# Patient Record
Sex: Female | Born: 1965 | Race: White | Hispanic: No | Marital: Single | Smoking: Former smoker
Health system: Southern US, Community
[De-identification: ages and names within clinical notes are randomized; demographics above are authoritative.]

## PROBLEM LIST (undated history)

## (undated) DIAGNOSIS — E559 Vitamin D deficiency, unspecified: Secondary | ICD-10-CM

## (undated) DIAGNOSIS — I10 Essential (primary) hypertension: Secondary | ICD-10-CM

## (undated) DIAGNOSIS — G43909 Migraine, unspecified, not intractable, without status migrainosus: Secondary | ICD-10-CM

## (undated) DIAGNOSIS — E0590 Thyrotoxicosis, unspecified without thyrotoxic crisis or storm: Secondary | ICD-10-CM

## (undated) DIAGNOSIS — M1990 Unspecified osteoarthritis, unspecified site: Secondary | ICD-10-CM

## (undated) DIAGNOSIS — K219 Gastro-esophageal reflux disease without esophagitis: Secondary | ICD-10-CM

## (undated) DIAGNOSIS — M7120 Synovial cyst of popliteal space [Baker], unspecified knee: Secondary | ICD-10-CM

## (undated) DIAGNOSIS — M109 Gout, unspecified: Secondary | ICD-10-CM

## (undated) DIAGNOSIS — I25111 Atherosclerotic heart disease of native coronary artery with angina pectoris with documented spasm: Secondary | ICD-10-CM

## (undated) DIAGNOSIS — Z9889 Other specified postprocedural states: Secondary | ICD-10-CM

## (undated) DIAGNOSIS — K635 Polyp of colon: Secondary | ICD-10-CM

## (undated) DIAGNOSIS — K2270 Barrett's esophagus without dysplasia: Secondary | ICD-10-CM

## (undated) DIAGNOSIS — R9389 Abnormal findings on diagnostic imaging of other specified body structures: Secondary | ICD-10-CM

## (undated) HISTORY — PX: TONSILLECTOMY: SHX5217

## (undated) HISTORY — DX: Barrett's esophagus without dysplasia: K22.70

## (undated) HISTORY — DX: Gastro-esophageal reflux disease without esophagitis: K21.9

## (undated) HISTORY — PX: APPENDECTOMY: SHX54

## (undated) HISTORY — DX: Unspecified osteoarthritis, unspecified site: M19.90

## (undated) HISTORY — DX: Vitamin D deficiency, unspecified: E55.9

## (undated) HISTORY — DX: Polyp of colon: K63.5

## (undated) HISTORY — DX: Other specified postprocedural states: Z98.890

## (undated) HISTORY — PX: OTHER SURGICAL HISTORY: SHX169

## (undated) HISTORY — DX: Migraine, unspecified, not intractable, without status migrainosus: G43.909

## (undated) HISTORY — PX: ABDOMINAL HYSTERECTOMY: SHX81

## (undated) HISTORY — DX: Synovial cyst of popliteal space (Baker), unspecified knee: M71.20

## (undated) HISTORY — DX: Abnormal findings on diagnostic imaging of other specified body structures: R93.89

---

## 1998-10-02 HISTORY — PX: HIATAL HERNIA REPAIR: SHX195

## 1999-11-30 ENCOUNTER — Encounter: Payer: Self-pay | Admitting: Emergency Medicine

## 1999-11-30 ENCOUNTER — Emergency Department (HOSPITAL_COMMUNITY): Admission: EM | Admit: 1999-11-30 | Discharge: 1999-12-01 | Payer: Self-pay | Admitting: Emergency Medicine

## 2000-12-05 ENCOUNTER — Emergency Department (HOSPITAL_COMMUNITY): Admission: EM | Admit: 2000-12-05 | Discharge: 2000-12-05 | Payer: Self-pay | Admitting: *Deleted

## 2001-11-03 ENCOUNTER — Emergency Department (HOSPITAL_COMMUNITY): Admission: EM | Admit: 2001-11-03 | Discharge: 2001-11-03 | Payer: Self-pay | Admitting: Emergency Medicine

## 2005-11-07 ENCOUNTER — Emergency Department (HOSPITAL_COMMUNITY): Admission: EM | Admit: 2005-11-07 | Discharge: 2005-11-07 | Payer: Self-pay | Admitting: Emergency Medicine

## 2006-01-17 ENCOUNTER — Inpatient Hospital Stay (HOSPITAL_COMMUNITY): Admission: AD | Admit: 2006-01-17 | Discharge: 2006-01-19 | Payer: Self-pay | Admitting: Obstetrics and Gynecology

## 2006-02-12 ENCOUNTER — Inpatient Hospital Stay (HOSPITAL_COMMUNITY): Admission: AD | Admit: 2006-02-12 | Discharge: 2006-02-14 | Payer: Self-pay | Admitting: Obstetrics and Gynecology

## 2006-02-14 ENCOUNTER — Ambulatory Visit: Payer: Self-pay | Admitting: Neonatology

## 2006-02-21 ENCOUNTER — Encounter (HOSPITAL_COMMUNITY): Admission: RE | Admit: 2006-02-21 | Discharge: 2006-03-23 | Payer: Self-pay | Admitting: Obstetrics & Gynecology

## 2006-03-17 ENCOUNTER — Emergency Department (HOSPITAL_COMMUNITY): Admission: EM | Admit: 2006-03-17 | Discharge: 2006-03-17 | Payer: Self-pay | Admitting: *Deleted

## 2006-04-05 ENCOUNTER — Encounter (HOSPITAL_COMMUNITY): Admission: RE | Admit: 2006-04-05 | Discharge: 2006-05-05 | Payer: Self-pay | Admitting: Obstetrics & Gynecology

## 2006-04-25 ENCOUNTER — Inpatient Hospital Stay (HOSPITAL_COMMUNITY): Admission: AD | Admit: 2006-04-25 | Discharge: 2006-04-25 | Payer: Self-pay | Admitting: Obstetrics and Gynecology

## 2006-05-17 ENCOUNTER — Inpatient Hospital Stay (HOSPITAL_COMMUNITY): Admission: RE | Admit: 2006-05-17 | Discharge: 2006-05-19 | Payer: Self-pay | Admitting: Obstetrics and Gynecology

## 2007-05-04 ENCOUNTER — Emergency Department (HOSPITAL_COMMUNITY): Admission: EM | Admit: 2007-05-04 | Discharge: 2007-05-04 | Payer: Self-pay | Admitting: Emergency Medicine

## 2007-05-16 ENCOUNTER — Inpatient Hospital Stay (HOSPITAL_COMMUNITY): Admission: AD | Admit: 2007-05-16 | Discharge: 2007-05-16 | Payer: Self-pay | Admitting: Obstetrics and Gynecology

## 2007-05-22 ENCOUNTER — Inpatient Hospital Stay (HOSPITAL_COMMUNITY): Admission: AD | Admit: 2007-05-22 | Discharge: 2007-05-23 | Payer: Self-pay | Admitting: Family Medicine

## 2007-05-23 ENCOUNTER — Other Ambulatory Visit: Admission: RE | Admit: 2007-05-23 | Discharge: 2007-05-23 | Payer: Self-pay | Admitting: Obstetrics and Gynecology

## 2007-05-23 ENCOUNTER — Encounter: Payer: Self-pay | Admitting: Obstetrics and Gynecology

## 2007-05-23 ENCOUNTER — Ambulatory Visit: Payer: Self-pay | Admitting: Obstetrics & Gynecology

## 2007-05-30 ENCOUNTER — Ambulatory Visit: Payer: Self-pay | Admitting: Obstetrics & Gynecology

## 2007-06-06 ENCOUNTER — Inpatient Hospital Stay (HOSPITAL_COMMUNITY): Admission: AD | Admit: 2007-06-06 | Discharge: 2007-06-07 | Payer: Self-pay | Admitting: Obstetrics and Gynecology

## 2007-06-12 ENCOUNTER — Ambulatory Visit: Payer: Self-pay | Admitting: Obstetrics and Gynecology

## 2007-06-19 ENCOUNTER — Ambulatory Visit: Payer: Self-pay | Admitting: Obstetrics and Gynecology

## 2007-06-25 ENCOUNTER — Emergency Department (HOSPITAL_COMMUNITY): Admission: EM | Admit: 2007-06-25 | Discharge: 2007-06-25 | Payer: Self-pay | Admitting: *Deleted

## 2007-07-25 ENCOUNTER — Ambulatory Visit: Payer: Self-pay | Admitting: Obstetrics and Gynecology

## 2007-08-02 ENCOUNTER — Ambulatory Visit (HOSPITAL_COMMUNITY): Admission: RE | Admit: 2007-08-02 | Discharge: 2007-08-02 | Payer: Self-pay | Admitting: Obstetrics & Gynecology

## 2007-08-07 ENCOUNTER — Encounter: Payer: Self-pay | Admitting: Obstetrics and Gynecology

## 2007-08-07 ENCOUNTER — Inpatient Hospital Stay (HOSPITAL_COMMUNITY): Admission: RE | Admit: 2007-08-07 | Discharge: 2007-08-10 | Payer: Self-pay | Admitting: Obstetrics and Gynecology

## 2007-08-12 ENCOUNTER — Ambulatory Visit: Payer: Self-pay | Admitting: Obstetrics and Gynecology

## 2007-08-14 ENCOUNTER — Ambulatory Visit: Payer: Self-pay | Admitting: Obstetrics & Gynecology

## 2007-08-22 ENCOUNTER — Ambulatory Visit: Payer: Self-pay | Admitting: Obstetrics & Gynecology

## 2007-09-05 ENCOUNTER — Ambulatory Visit: Payer: Self-pay | Admitting: Obstetrics & Gynecology

## 2008-07-08 ENCOUNTER — Ambulatory Visit: Payer: Self-pay | Admitting: Obstetrics and Gynecology

## 2009-01-03 ENCOUNTER — Emergency Department (HOSPITAL_COMMUNITY): Admission: EM | Admit: 2009-01-03 | Discharge: 2009-01-03 | Payer: Self-pay | Admitting: Emergency Medicine

## 2009-03-05 ENCOUNTER — Emergency Department (HOSPITAL_COMMUNITY): Admission: EM | Admit: 2009-03-05 | Discharge: 2009-03-06 | Payer: Self-pay | Admitting: Emergency Medicine

## 2009-06-01 ENCOUNTER — Emergency Department (HOSPITAL_COMMUNITY): Admission: EM | Admit: 2009-06-01 | Discharge: 2009-06-01 | Payer: Self-pay | Admitting: Family Medicine

## 2009-06-17 ENCOUNTER — Emergency Department (HOSPITAL_COMMUNITY): Admission: EM | Admit: 2009-06-17 | Discharge: 2009-06-17 | Payer: Self-pay | Admitting: Emergency Medicine

## 2009-08-05 DIAGNOSIS — S6990XA Unspecified injury of unspecified wrist, hand and finger(s), initial encounter: Secondary | ICD-10-CM

## 2009-08-05 DIAGNOSIS — S6980XA Other specified injuries of unspecified wrist, hand and finger(s), initial encounter: Secondary | ICD-10-CM

## 2009-08-05 DIAGNOSIS — Q602 Renal agenesis, unspecified: Secondary | ICD-10-CM | POA: Insufficient documentation

## 2009-08-16 ENCOUNTER — Encounter: Admission: RE | Admit: 2009-08-16 | Discharge: 2009-08-16 | Payer: Self-pay | Admitting: Family Medicine

## 2009-08-30 ENCOUNTER — Encounter: Admission: RE | Admit: 2009-08-30 | Discharge: 2009-08-30 | Payer: Self-pay | Admitting: Family Medicine

## 2009-08-31 ENCOUNTER — Emergency Department (HOSPITAL_COMMUNITY): Admission: EM | Admit: 2009-08-31 | Discharge: 2009-09-01 | Payer: Self-pay | Admitting: Emergency Medicine

## 2009-09-03 DIAGNOSIS — R935 Abnormal findings on diagnostic imaging of other abdominal regions, including retroperitoneum: Secondary | ICD-10-CM | POA: Insufficient documentation

## 2009-09-13 ENCOUNTER — Encounter: Admission: RE | Admit: 2009-09-13 | Discharge: 2009-09-13 | Payer: Self-pay | Admitting: Family Medicine

## 2009-09-23 DIAGNOSIS — R9389 Abnormal findings on diagnostic imaging of other specified body structures: Secondary | ICD-10-CM

## 2009-09-23 DIAGNOSIS — F411 Generalized anxiety disorder: Secondary | ICD-10-CM

## 2009-11-14 ENCOUNTER — Emergency Department (HOSPITAL_COMMUNITY): Admission: EM | Admit: 2009-11-14 | Discharge: 2009-11-14 | Payer: Self-pay | Admitting: Emergency Medicine

## 2010-01-13 DIAGNOSIS — M109 Gout, unspecified: Secondary | ICD-10-CM

## 2010-01-13 HISTORY — DX: Gout, unspecified: M10.9

## 2010-03-17 ENCOUNTER — Emergency Department (HOSPITAL_COMMUNITY): Admission: EM | Admit: 2010-03-17 | Discharge: 2010-03-17 | Payer: Self-pay | Admitting: Emergency Medicine

## 2010-12-23 LAB — POCT I-STAT, CHEM 8
BUN: 16 mg/dL (ref 6–23)
Calcium, Ion: 1.08 mmol/L — ABNORMAL LOW (ref 1.12–1.32)
Chloride: 106 mEq/L (ref 96–112)
Creatinine, Ser: 1 mg/dL (ref 0.4–1.2)
Glucose, Bld: 117 mg/dL — ABNORMAL HIGH (ref 70–99)
HCT: 42 % (ref 36.0–46.0)
Hemoglobin: 14.3 g/dL (ref 12.0–15.0)
Potassium: 4.1 mEq/L (ref 3.5–5.1)
Sodium: 137 meq/L (ref 135–145)
TCO2: 25 mmol/L (ref 0–100)

## 2010-12-23 LAB — URINALYSIS, ROUTINE W REFLEX MICROSCOPIC
Bilirubin Urine: NEGATIVE
Glucose, UA: NEGATIVE mg/dL
Ketones, ur: NEGATIVE mg/dL
Leukocytes, UA: NEGATIVE
Nitrite: NEGATIVE
Protein, ur: NEGATIVE mg/dL
Specific Gravity, Urine: 1.015 (ref 1.005–1.030)
Urobilinogen, UA: 0.2 mg/dL (ref 0.0–1.0)
pH: 6.5 (ref 5.0–8.0)

## 2010-12-23 LAB — CBC
HCT: 40.8 % (ref 36.0–46.0)
Hemoglobin: 13.9 g/dL (ref 12.0–15.0)
MCHC: 34.2 g/dL (ref 30.0–36.0)
MCV: 94.4 fL (ref 78.0–100.0)
Platelets: 213 K/uL (ref 150–400)
RBC: 4.32 MIL/uL (ref 3.87–5.11)
RDW: 13.2 % (ref 11.5–15.5)
WBC: 8.8 K/uL (ref 4.0–10.5)

## 2010-12-23 LAB — URIC ACID: Uric Acid, Serum: 6.7 mg/dL (ref 2.4–7.0)

## 2010-12-23 LAB — DIFFERENTIAL
Basophils Absolute: 0 10*3/uL (ref 0.0–0.1)
Basophils Relative: 0 % (ref 0–1)
Eosinophils Absolute: 0.2 K/uL (ref 0.0–0.7)
Eosinophils Relative: 3 % (ref 0–5)
Lymphocytes Relative: 19 % (ref 12–46)
Lymphs Abs: 1.7 K/uL (ref 0.7–4.0)
Monocytes Absolute: 0.6 10*3/uL (ref 0.1–1.0)
Monocytes Relative: 7 % (ref 3–12)
Neutro Abs: 6.3 K/uL (ref 1.7–7.7)
Neutrophils Relative %: 71 % (ref 43–77)

## 2010-12-23 LAB — URINE MICROSCOPIC-ADD ON

## 2011-01-03 LAB — POCT I-STAT, CHEM 8
BUN: 14 mg/dL (ref 6–23)
Calcium, Ion: 1.15 mmol/L (ref 1.12–1.32)
Glucose, Bld: 90 mg/dL (ref 70–99)
Potassium: 3.3 mEq/L — ABNORMAL LOW (ref 3.5–5.1)
Sodium: 141 mEq/L (ref 135–145)

## 2011-01-04 LAB — URINALYSIS, ROUTINE W REFLEX MICROSCOPIC
Nitrite: NEGATIVE
Protein, ur: NEGATIVE mg/dL
Specific Gravity, Urine: 1.019 (ref 1.005–1.030)
pH: 5.5 (ref 5.0–8.0)

## 2011-01-04 LAB — URINE CULTURE
Colony Count: NO GROWTH
Culture: NO GROWTH

## 2011-01-04 LAB — DIFFERENTIAL
Eosinophils Absolute: 0.3 10*3/uL (ref 0.0–0.7)
Eosinophils Relative: 4 % (ref 0–5)
Lymphs Abs: 2.5 10*3/uL (ref 0.7–4.0)
Monocytes Absolute: 0.8 10*3/uL (ref 0.1–1.0)

## 2011-01-04 LAB — POCT I-STAT, CHEM 8
Glucose, Bld: 89 mg/dL (ref 70–99)
HCT: 44 % (ref 36.0–46.0)
Hemoglobin: 15 g/dL (ref 12.0–15.0)
Potassium: 3.3 mEq/L — ABNORMAL LOW (ref 3.5–5.1)
Sodium: 140 mEq/L (ref 135–145)

## 2011-01-04 LAB — URINE MICROSCOPIC-ADD ON

## 2011-01-04 LAB — CBC
Platelets: 215 10*3/uL (ref 150–400)
WBC: 8.5 10*3/uL (ref 4.0–10.5)

## 2011-01-06 LAB — POCT RAPID STREP A (OFFICE): Streptococcus, Group A Screen (Direct): NEGATIVE

## 2011-02-14 NOTE — Discharge Summary (Signed)
Amanda Cardenas, Amanda Cardenas                  ACCOUNT NO.:  [id number]   MEDICAL RECORD NO.:  [id number]           PATIENT TYPE:   LOCATION:                                 FACILITY:   PHYSICIAN:  Phil D. Okey Dupre, M.D.     DATE OF BIRTH:  [birth year]   DATE OF ADMISSION:  08/07/2007  DATE OF DISCHARGE:                               DISCHARGE SUMMARY   DATE OF PHYSICAL EXAMINATION:  July 25, 2007   CHIEF COMPLAINT:  Very heavy bleeding and lower abdominal pain.   PRESENT ILLNESS:  The patient is a 45 year old Caucasian female, gravida  4, para 4-0-0-92, with a 17-year-old baby, who had an abdominoplasty some  time ago and has symptomatic fibroids with menorrhagia and severe  disabling dysmenorrhea and chronic pelvic pain.  She was placed on  Lupron Depot, which stopped the bleeding; however, the uterus never did  shrink very much.  The uterus measured 12 cm with a separate 8-cm  leiomyomata.   ALLERGIES:  No known allergies.   MEDICATION:  She is on atenolol, hydrochlorothiazide and Zoloft.   REVIEW OF SYSTEMS:  Negative with exception of the present illness.   PAST MEDICAL HISTORY:  1. Hypertension, which was well controlled on her medications.  2. Depression.   PHYSICAL EXAMINATION:  VITAL SIGNS:  Temperature was 98.1, pulse 74,  blood pressure 114/82, respirations 14 per minute.  Weight is 161 pounds  and the patient's height is 5 feet 2 inches.  GENERAL:  A well-developed, well-nourished white female in no acute  distress.  HEENT:  PERRL, within normal limits.  NECK:  Supple.  Thyroid symmetrical with no masses.  BACK:  Erect.  LUNGS:  Clear to auscultation and percussion.  HEART:  No murmur, normal sinus rhythm.  BREASTS:  Symmetrical with no dominant masses, no nipple discharge.  ABDOMEN:  Soft, flat and nontender.  The uterus could be palpated above  the symphysis half way to the umbilicus, but nontender.  EXTREMITIES:  Negative.  No edema, no varices.  NEUROLOGIC:  DTRs within  normal limits.  GENITALIA:  External genitalia is normal.  BUS within normal limits.  The vagina is clean and well rugated.  The introitus is marital.  The  cervix is parous and clean, well epithelialized.  The uterus is markedly  irregular, about 14 weeks' gestational size.  RECTAL:  No dominant  masses noted   IMPRESSION:  Symptomatic leiomyomata uteri, not changing in size  appreciably since last checked, since given Lupron Depot.  The patient  decided she wanted to go ahead with the surgery.  We will talk to her in  detail about the possible complications, especially those related to  injury to the urinary tract and gastrointestinal tract.  We also talked  about postoperative and operative hemorrhage and infection and  anesthetic complications.  She understands she will never be able to  have any more children; she wants no further children and she wants to  be relieved of the pelvic pressure and pain that she has been having, as  well as a terrible bleeding  that she had prior to her Lupron Depot.   IMPRESSION:  Symptomatic leiomyomata uteri.   PLAN:  Total abdominal hysterectomy      Phil D. Okey Dupre, M.D.  Electronically Signed     PDR/MEDQ  D:  08/07/2007  T:  08/07/2007  Job:  [id number]

## 2011-02-14 NOTE — Group Therapy Note (Signed)
NAMECLAUDETTA, Amanda Cardenas NO.:  [id number]   MEDICAL RECORD NO.:  [id number]          PATIENT TYPE:  WOC   LOCATION:  [facility]                   FACILITY:  [facility]   PHYSICIAN:  Elsie Lincoln, MD      DATE OF BIRTH:  October 16, 1965   DATE OF SERVICE:                                  CLINIC NOTE   Patient is a 45 year old female who presents for wound check.  She had a  slight wound disruption on the right side of her incision, and she has  been packing it.  It is now completely healed.  She is asymptomatic and  nontender across her entire abdomen.  She is approximately four weeks  postoperative.  She needs another postoperative visit in two weeks.  She  is very happy with the results so far.  We will check her then in two  weeks and clear her for any defects and full activity at that point.           ______________________________  Elsie Lincoln, MD     KL/MEDQ  D:  09/05/2007  T:  09/05/2007  Job:  [id number]

## 2011-02-14 NOTE — Op Note (Signed)
NAMEJANIKA, Cardenas                  ACCOUNT NO.:  [id number]   MEDICAL RECORD NO.:  [id number]          PATIENT TYPE:  INP   LOCATION:                                [facility]   PHYSICIAN:  Norton Blizzard, MD    DATE OF BIRTH:  09/05/1966   DATE OF PROCEDURE:  08/07/2007  DATE OF DISCHARGE:                               OPERATIVE REPORT   PREOPERATIVE DIAGNOSIS:  Symptomatic fibroid uterus.   POSTOPERATIVE DIAGNOSIS:  Symptomatic fibroid uterus.   PROCEDURE:  Total abdominal hysterectomy.   SURGEON:  Norton Blizzard, M.D.   ASSISTANTMichele Mcalpine D. Okey Dupre, M.D.   ANESTHESIA:  General.   FLUIDS REPLACED:  2500 mL of lactated ringers.   ESTIMATED BLOOD LOSS:  200 mL.   URINE OUTPUT:  300 mL.   INDICATIONS FOR PROCEDURE:  The patient is a 45 year old G4, P4, with a  history of a symptomatic fibroid uterus.  The patient was counseled  regarding her options for management.  Given the concern about her large  uterine size preoperatively, the patient was given a dose of Depo-Lupron  to help shrink her fibroid uterus and also help prevent anemia.  At the  time of surgery, her uterus was noted to be 18 weeks size.  At her  preoperative visit, the risks of surgery including infection, bleeding,  injury to surrounding organs, and need for additional procedures were  discussed with the patient and informed consent was obtained.   FINDINGS:  18 weeks size fibroid uterus.  Normal adnexae bilaterally.  Normal upper abdomen.   SPECIMENS:  Uterus and cervix sent to pathology.   COMPLICATIONS:  None.   PROCEDURE IN DETAIL:  The patient received preoperative IV antibiotics  approximately 30 minutes prior to surgery.  She was then taken to the  operating room and general anesthesia was placed without difficulty.  Compression boots were applied to the lower extremities prior to  anesthesia induction.  The patient was then placed in the supine  position.  The abdomen and perineum were  prepped and draped in the usual  manner, and a Foley catheter was inserted into the bladder and attached  to constant drainage.   A Pfannenstiel transverse incision was made 2 fingerbreadths above the  pubic symphysis.  Of note, the patient had a prior incision in this area  from her abdominoplasty.  A portion of this was positioned in the  Pfannenstiel region and was excised.  Dissection was carried down to the  fascia using electrocautery and the fascial incision was extended  bilaterally using electrocautery without difficulty.  The rectus muscles  were then split in the Maylard fashion and bilateral epigastric vessels  were secured and ligated.  The peritoneum was entered bluntly and this  incision was extended superiorly and inferiorly with care taken to  prevent bowel or bladder injury.  Upon entry into the abdominal cavity,  the upper abdomen was palpated and found to be normal.  Attention was  turned to the pelvis.   The round ligaments on each side were clamped, transected with  electrocautery, and suture ligated, allowing entry into the broad  ligament.  The anterior and posterior leaves of the broad ligament were  separated and the ureters were palpated to be on the medial leaf of the  posterior ligament and they were confirmed to be safely away from the  area of dissection.  A hole was cleared in a clear portion of the broad  ligament and the utero-ovarian ligament and fallopian tube were clamped  on the patient's right side  This was clamped, cut, and doubly suture  ligated with good hemostasis; leaving the fallopian tube and ovary in  place .  This procedure was repeated in an identical fashion on the  opposite side.  A bladder flap was created across the anterior leaf of  the broad ligament and the bladder was bluntly dissected off the lower  uterine segment and cervix with good hemostasis.  The broad ligament was  then clamped, cut, and ligated bilaterally.  The uterine  arteries were  skeletonized bilaterally and clamped, cut, and ligated with care given  to provide ureteral injury.  The uterosacral ligaments were clamped,  cut, and ligated bilaterally.  Finally, the cardinal ligament was  clamped, cut, and ligated bilaterally.  Curved clamps were placed across  the vagina and the specimen was amputated and sent to pathology.  The  vaginal cuff was closed with a series of interrupted 0 Vicryl figure-of-  eight sutures with care given to incorporate the anterior pubocervical  fascia and the posterior rectovaginal fascia.  The vaginal cuff angles  were noted to have good hemostasis.  The pelvis was irrigated and  hemostasis was reconfirmed on all pedicles, the vaginal cuff, and along  the pelvic sidewall.  FloSeal was applied to the vaginal cuff for  further hemostasis.  The peritoneum was then reapproximated using 0  Vicryl in a running stitch.  The fascia was closed in an identical  fashion with running continuous 0 Vicryl suture.  The subcutaneous  tissues were reapproximated using plain gut  and the skin was closed  with staples.  The patient tolerated the procedure well.  Sponge, lap,  needle, and instrument counts were correct x2.  She was taken to the  recovery room in stable condition.      Norton Blizzard, MD  Electronically Signed     UAD/MEDQ  D:  08/07/2007  T:  08/07/2007  Job:  [id number]

## 2011-02-14 NOTE — Group Therapy Note (Signed)
NAMELIBI, CORSO                  ACCOUNT NO.:  [id number]   MEDICAL RECORD NO.:  [id number]          PATIENT TYPE:  WOC   LOCATION:  [facility]                   FACILITY:  [facility]   PHYSICIAN:  Johnella Moloney, MD        DATE OF BIRTH:  Jun 27, 1966   DATE OF SERVICE:                                  CLINIC NOTE   HISTORY OF PRESENT ILLNESS:  Patient is a 45 year old G5, P4 who was  last seen on May 23, 2007, for evaluation of menometrorrhagia in a  setting of known fibroid uterus.  Patient underwent an endometrial  biopsy at that time and is here to follow up result.  Of note, patient  was given a prescription for Provera 10 mg p.o. b.i.d. to take for 10  days, given the amount of bleeding that she was having, and she was told  to come back at this visit for discussion of endometrial biopsy results  and further discussion about management.  No change in medical history.   PHYSICAL EXAM:  VITAL SIGNS:  Stable.  Physical exam deferred.   RESULTS:  Endometrial biopsy showed degenerating secretory-type  endometrium with focal tubal metaplasia.   ASSESSMENT/PLAN:  Patient is a 45 year old with abnormal uterine  bleeding in the setting of a fibroid uterus.  Management options were  discussed with patient including medical management of her bleeding,  using hormones, Mirena IUD, or surgical management involving  hysterectomy.  Patient opted for a hysterectomy.  Given the size of her  uterus on examination and on imaging, patient was advised that she would  not be able to have a vaginal hysterectomy but will need an abdominal  hysterectomy.  Patient agrees with plan.  The risks of surgery,  including bleeding requiring transfusion, infection requiring  antibiotics, and injury to surrounding organs which might require  additional procedures, and anesthesias risks, were discussed with the  patient, and she understands plan.  The plan for now is for patient to  get a letter of medical  clearance from her primary care Dejanay Wamboldt, given  her hypertension, after which she will be scheduled for surgery and also  will have a preoperative appointment with anesthesia prior to surgery.  Patient verbalized understanding of plan.           ______________________________  Johnella Moloney, MD     UD/MEDQ  D:  05/30/2007  T:  05/31/2007  Job:  [id number]

## 2011-02-14 NOTE — Group Therapy Note (Signed)
Amanda Cardenas, Amanda Cardenas                  ACCOUNT NO.:  [id number]   MEDICAL RECORD NO.:  [id number]          PATIENT TYPE:  WOC   LOCATION:  [facility]                   FACILITY:  [facility]   PHYSICIAN:  Wilburt Finlay, M.D.     DATE OF BIRTH:  October 27, 1965   DATE OF SERVICE:                                  CLINIC NOTE   CHIEF COMPLAINT:  Preop for surgery scheduled on August 06, 2007.   HISTORY OF PRESENT ILLNESS:  Patient is a 45 year old Caucasian female,  gravida 4, para 4-0-0-4 who presents for evaluation prior to having  surgery done for uterine fibroids.  She had been given Depo-Provera and  Depo-Lupron for her dysfunctional uterine bleeding and her uterine  fibroids with the hope to shrink the uterine fibroids.  Today, she  reports that her bleeding has stopped after given the Depo-Provera.  She  denies any complaints of shortness of breath, chest pain, nausea,  vomiting, headaches, or dizziness.   REVIEW OF SYSTEMS:  Unremarkable.   PAST MEDICAL HISTORY:  Significant for:  1. Hypertension which is well controlled on her medicines.  2. Depression.   EXAM:  Her temperature was 98.1.  Pulse 74.  Blood pressure 114/82.  Weight was 161 pounds.  GENERAL:  No acute distress.  CV EXAM:  Regular rate and rhythm.  No murmurs appreciated.  LUNGS:  Clear to auscultation bilaterally.  ABDOMEN:  Soft and nontender.  Uterus palpated at about 18-week size.   ASSESSMENT AND PLAN:  This is a 45 year old, gravida 4, para 4, with  fibroid uterus and improved dysfunctional uterine bleeding who presents  for preop exam.   The uterus seems to have increased in size despite the Depo-Lupron.  Patient is no longer symptomatic from the fibroids.  She has stopped  bleeding and her pain has improved.  She does, however, want to proceed  with the plan of removing the uterus and options were discussed  regarding whether or not she wanted to wait a few more months to see if  the fibroids would shrink before  proceeding with surgery.  Patient,  however, wants to go ahead and have the surgery done next month as  scheduled.  Since the uterine bleeding has stopped, we will not check  another CBC at this time.  We will check one preoperatively on the day  of surgery.  Patient was seen and examined with Dr. Mia Cardenas.           ______________________________  Wilburt Finlay, M.D.    LJ/MEDQ  D:  07/25/2007  T:  07/26/2007  Job:  [id number]

## 2011-02-14 NOTE — Group Therapy Note (Signed)
Amanda Cardenas, ZENT                  ACCOUNT NO.:  [id number]   MEDICAL RECORD NO.:  [id number]          PATIENT TYPE:  WOC   LOCATION:  [facility]                   [facility]ACILITY:  [facility]   PHYSICIAN:  Johnella Moloney, MD        DATE OF BIRTH:  [birth year]   DATE OF SERVICE:                                  CLINIC NOTE   CHIEF COMPLAINT:  Abnormal uterine bleeding.   HISTORY OF PRESENT ILLNESS:  The patient is a 45 year old, G5, P4, who  presents to the clinic today with abnormal vaginal bleeding.  The  patient noted that this bleeding started on July 3rd, and she has been  seen in the MAU 3 times for this bleeding, on August 2nd, August 14th  and August 20th.  The patient describes her bleeding as moderate to  heavy with clots at times but has been constant since the 3rd of July.  On evaluation in MAU, the patient was initially found to have a  hematocrit of 34 on the 2nd which was stable on the 14th but was found  to be 29 on August 20th.  She also had an ultrasound which showed a 12-  week-sized uterus with 3 dominant leiomyomas, the largest measuring 8  cm, and 2 measuring 2 cm and 3 cm, with an endometrial thickness of 13  mm.  The patient was sent home on iron therapy and she was also given a  prescription for Megace at her last emergency room visit; however, the  patient reported being called at home and told not to take the Megace  prior to her appointment today.  She was scheduled for an endometrial  biopsy for further evaluation.   PAST MEDICAL HISTORY:  1. Hypertension,  2. Anxiety.  3. Depression.  4. Fibroids.   PAST SURGICAL HISTORY:  Appendectomy.   MEDICATIONS:  1. Hydrochlorothiazide 25 mg once a day.  2. Iron 1 twice a day.  3. Atenolol 50 mg once a day.  4. Zoloft 50 mg once a day.  5. Vicodin as needed.  6. Motrin as needed.   ALLERGIES:  NO KNOWN DRUG ALLERGIES.   SOCIAL HISTORY:  The patient is a current smoker within the last 12  months and occasional  drinker.   FAMILY HISTORY:  No family history of ovarian, endometrial or cervical  cancer.   PHYSICAL EXAMINATION:  VITAL SIGNS:  Temperature 97.2, pulse 70, blood  pressure 110/80, weight 167 pounds, height 5 feet 2 inches.  GENERAL:  No apparent distress.  ABDOMEN:  Soft, nontender, nondistended.   ENDOMETRIAL BIOPSY:  The patient was counseled regarding endometrial  biopsy.  The risks, benefits, indications and alternatives to  endometrial biopsy were discussed, including the indication of ruling  out neoplasia and the risks of bleeding, infection, injury to uterus and  surrounding organs and need for additional procedures.  All the  patient's questions were answered.   The patient was then placed in a dorsal lithotomy position and examined  and found to have 12-week-size anteverted uterus.  A speculum was then  placed in the patient's vagina.  The  cervix was identified and swabbed  with Betadine swabs times 3.  Tenaculum was placed on the anterior lip  of the cervix to stabilize it.  The 3-mm Pipelle was then introduced in  the patient's cervix and noted to encompass a depth of 10 cm.  The curet  was then slowly rotated and a significant amount of curetting was  obtained; however, there was more blood noted and a second sample was  obtained to maximize the yield of cells.  Of note, the patient was  having a small amount of bleeding before the procedure and there was a  small amount of red blood in the vaginal vault.  The Pipelle was then  removed.  The tenaculum was removed.  There was a small amount of  bleeding from the anterior cervix, and this was controlled using  pressure and also silver nitrate.  All instruments were then removed  from the patient's vagina.   The post procedure instructions were no intercourse or anything in the  vagina for the next 3 or 4 days and to take NSAIDs as needed for  cramping or pain.  She was also informed to call for excessive bleeding,   fevers, chills, sweats or any other concerns.   PLAN:  The patient has dysfunctional uterine bleeding, now status post  endometrial biopsy.  Will follow up results of endometrial biopsy and at  the next visit will discuss methods of treating the fibroids, including  surgery, myomectomy, hysterectomy, or [place] or any other alternative  modes.  Full discussion to ensue at the next visit.  In the meantime the  patient was given a prescription for Provera 10 mg p.o. b.i.d. to take  for 10 days.  She was also told to expect withdrawal bleeding at the end  of the Provera course.  The patient to return to the clinic in 1 week to  discuss results.           ______________________________  Johnella Moloney, MD     UD/MEDQ  D:  05/23/2007  T:  05/24/2007  Job:  [id number]

## 2011-02-14 NOTE — Group Therapy Note (Signed)
Amanda Cardenas, Amanda Cardenas NO.:  [id number]   MEDICAL RECORD NO.:  [id number]          PATIENT TYPE:  WOC   LOCATION:  [facility]                   FACILITY:  [facility]   PHYSICIAN:  Argentina Donovan, MD        DATE OF BIRTH:  07-31-1966   DATE OF SERVICE:  06/19/2007                                  CLINIC NOTE   The patient is a 45 year old, Caucasian female, gravida 4, para 4-0-0-4  who has been on Depo-Provera and Depo-Lupron for dysfunctional bleeding  and uterine fibroids.  Had endometrial biopsy which was normal and is  scheduled for total abdominal hysterectomy on October 4.  She came in  today still bleeding but much less, just so it is annoying, she says,  with and node on her upper left thigh which is tender.  It is going away  but I think it is secondary to the Depo injection that she had several  weeks ago.  In addition, the patient has undergone an abdominoplasty,  and she brought her records with her.  I wanted to make sure there was  no sign of any mesh within her abdominal cavity.  She also has undergone  surgery for hiatal hernia and severe reflux by open procedure, had  developed postoperative atelectasis and had had a chest tube in for a  while.   Among her medications, atenolol, hydrochlorothiazide, Zoloft, Xanax,  Percocet recently and multivitamins.   The patient is in good health for surgery.  The uterus does not seem to  have shrunken anymore, although I think we can take this out through a  transverse low abdominal incision.           ______________________________  Argentina Donovan, MD     PR/MEDQ  D:  06/19/2007  T:  06/20/2007  Job:  [id number]

## 2011-02-14 NOTE — Group Therapy Note (Signed)
NAMENATAJAH, DERDERIAN NO.:  [id number]   MEDICAL RECORD NO.:  [id number]          PATIENT TYPE:  MAT   LOCATION:  [facility]                    [facility]   PHYSICIAN:  Argentina Donovan, MD        DATE OF BIRTH:  Jul 26, 1966   DATE OF SERVICE:                                  CLINIC NOTE   ADDENDUM:  The patient is a 45 year old Caucasian female, gravida 4,  para 4-0-0-4 with a baby 37 year old, who, on looking at the abdomen,  apparently had an abdominoplasty, with symptomatic fibroid uterus of  menorrhagia and severe disabling dysmenorrhea and also chronic pelvic  pain, with the uterus with multiple leiomyomata and pelvically feels  about 14 weeks' gestation size.  She was placed on Provera to control  her bleeding, which also controlled most of her pain while waiting for  surgery and placed on iron because of her anemia.  She presented on  September 4 in the Maternity Admissions office with severe lower  abdominal and back pain and vaginal bleeding.  She ran out of her  Provera several days prior to this and so obviously had a withdrawal  bleed.  On examining her and talking to her, she was awaiting scheduling  for her hysterectomy and obviously the Provera was successful and  controlling her bleeding up to that point and during that time, her  hemoglobin had improved from her significant anemia to now a hemoglobin  of around 11.  I discussed with the patient treatment and I have  encouraged our scheduling secretary to go ahead and schedule this  patient for within the month and make sure she was seen within about a  week prior to that in the clinic by Dr. Silas Flood or myself to reevaluate the  size of the uterus.  My plan is to give the patient Depo-Provera to  immediately control the bleeding and Lupron Depot 11.25 mg in attempt to  shrink down the uterus.  I have my doubts that we can shrink it down  small enough to do a vaginal hysterectomy.  However, it certainly  might  be advantageous to be able to do a low transverse incision for  hysterectomy rather than a vertical abdominal incision.  If at that time  she comes in, the bleeding has continued, I would go ahead with the  surgery in a month; if, however, there is a significant decrease in the  size of the uterus, it may well be worth postponing that surgery and  rescheduling her for a month or 2 later than that, but that is reason I  am having her come in for reevaluation at least a week prior to surgery.   IMPRESSION:  Chronic pelvic pain, probable adenomyosis, with  dysfunctional uterine bleeding in the past and withdrawal bleeding at  present and symptomatic leiomyomata uteri.           ______________________________  Argentina Donovan, MD     PR/MEDQ  D:  06/07/2007  T:  06/07/2007  Job:  [id number]

## 2011-02-14 NOTE — Group Therapy Note (Signed)
NAMELAKEESHA, Amanda Cardenas NO.:  [id number]   MEDICAL RECORD NO.:  [id number]          PATIENT TYPE:  WOC   LOCATION:  [facility]                   FACILITY:  [facility]   PHYSICIAN:  Argentina Donovan, MD        DATE OF BIRTH:  25-Nov-1965   DATE OF SERVICE:                                  CLINIC NOTE   The patient is a 45 year old Caucasian female.  Please see previous  dictation.  Gravida 4, para 4-0-0-4 who I saw in the MAU and who has  symptomatic fibroids.  The bleeding that she had has lessened since she  received the Depo Lupron and Depo-Provera in the MAU on 06/05/2008.  The  patient also has had an abdominoplasty and a diastasis hernia repair.  I  want to have her get signed for release for records from [facility], as she may have a mesh in there.  In addition to this, her  scheduled surgery for hysterectomy is on 06/25/2007.  I think that it is  much too early to evaluate any kind of advantage we would get from the  Depo Lupron.  Therefore, we are going to cancel that, have it  rescheduled a month, and if she is making good progress at that point we  would cancel again and probably reschedule her down the line for 2-3  months from then in order to be able to do this through a low abdominal  transverse incision that would not interfere too much with her previous  abdominoplasty and/or vaginal hysterectomy.   IMPRESSION:  Symptomatic fibroids with secondary anemia, improving           ______________________________  Argentina Donovan, MD     PR/MEDQ  D:  06/12/2007  T:  06/13/2007  Job:  [id number]

## 2011-02-14 NOTE — Discharge Summary (Signed)
Amanda Cardenas, Amanda Cardenas                  ACCOUNT NO.:  [id number]   MEDICAL RECORD NO.:  [id number]          PATIENT TYPE:  INP   LOCATION:  9306                          [facility]ILITY[facility]   PHYSICIAN:  Norton Blizzard, MD    DATE OF BIRTH:  10-Mar-1966   DATE OF ADMISSION:  08/07/2007  DATE OF DISCHARGE:  08/10/2007                               DISCHARGE SUMMARY   ADMISSION DIAGNOSIS:  Symptomatic fibroid uterus.   DISCHARGE DIAGNOSES:  1. Status post total abdominal hysterectomy.  2. Incisional cellulitis.   HOSPITAL COURSE:  The patient is a 45 year old G4, P4 with a history of  a symptomatic fibroid uterus, who opted for surgical management. The  patient underwent an uncomplicated total abdominal hysterectomy on  August 07, 2007. For further details of this operative, please refer to  separate dictated operative report.  Initially during her postoperative  course, the patient was stable. She was afebrile. Her incision was  clean, dry and intact and she had no signs or symptoms of any infection.  She was ambulating without difficulty, her pain was controlled on oral  pain medications and she passed flatus.  However, on postoperative day  number three, the patient reported an increase in incisional pain; no  fevers were reported.  On examination of her incision, she was noted to  have blanching erythema around the right half of her incision and there  was increased concern for cellulitis. The incisional staples on the  right side were removed at this point, and a small amount of  serosanguineous fluid was expressed from the wound. The wound was  thoroughly examined, and the fascia was found to be intact. The wound  was then packed with wet-to-dry gauze, and the patient was started on  Keflex 500 mg p.o. b.i.d. for incisional cellulitis. The patient  requested to go home on the evening of postoperative day number three,  given that the rest of her postoperative care was uncomplicated. She  was  set up with home health nurses to come in for daily wound checks and  dressing changes.   DISCHARGE CONDITION:  Stable.   DISCHARGE MEDICATIONS:  1. Keflex 100 mg p.o. b.i.d. to complete a seven day course.  2. Vicodin one tablet p.o. q.4 hours p.r.n. pain.  3. Nicotine patch 14 mcg daily.   DISCHARGE INSTRUCTIONS:  The patient was told to have nothing per vagina  for six weeks, and not to carry anything greater than 15 pounds for six  weeks.  She was given instructions to increase her activity slowly, and  as tolerated. She was instructed not to drive while she was on any  narcotic pain medication.  Instructions were also  given for the home health nurse for wet-to-dry incisional dressing  changes daily.  She was advised to call for any fevers, increase in  pain, purulent discharge from her incision, abnormal bleeding or any  other concerns.  Patient will come back to the clinic in one week for  follow up.      Norton Blizzard, MD  Electronically Signed  UAD/MEDQ  D:  10/08/2007  T:  10/08/2007  Job:  [id number]

## 2011-02-17 NOTE — Discharge Summary (Signed)
Amanda Cardenas, MENSCH                  ACCOUNT NO.:  [id number]   MEDICAL RECORD NO.:  [id number]          PATIENT TYPE:  INP   LOCATION:  9163                          [facility]ILITY[facility]   PHYSICIAN:  Gerrit Friends. Aldona Bar, M.D.   DATE OF BIRTH:  [birth year]   DATE OF ADMISSION:  01/17/2006  DATE OF DISCHARGE:  01/19/2006                                 DISCHARGE SUMMARY   DISCHARGE DIAGNOSIS:  1.  20-21 week intrauterine pregnancy undelivered.  2.  Lower abdominal pain secondary to either leiomyomatous uterus or      contractions, resolved.   HISTORY:  This 45 year old gravida 5, para 3, presented to maternity  admissions on the evening of April 18 with lower abdominal cramping. She was  seen in our office as a new OB patient approximately one week earlier. She  has known multiple large fibroids. She was actually seen in the office again  on April 18 prior to presenting at maternity admissions with some pain. At  that time, she had a positive urinalysis, was ultimately treated with  Macrobid and Darvocet.  The pain continued and she presented to maternity  admissions for further evaluation on the evening of April 18. She was seen  by Dr. Edward Jolly at the time of presentation on April 18.  Her urinalysis showed  a trace hemoglobin and scattered red cells and rare bacteria. Her CBC was  unremarkable. Her metabolic profile was unremarkable. It was felt that she  was having uterine contractions of at -[redacted] weeks gestation. She was placed on  magnesium sulfate after subcutaneous terbutaline did not help and she was  admitted for magnesium sulfate therapy.   HOSPITAL COURSE:  Her hospital course resulted in another ultrasound being  done which revealed uterine fibroids, a viable 20-week, 4-day intrauterine  pregnancy, normal amniotic fluid volume, and good cervical length. On April  19, Dr. Henderson Cloud spoke with Dr. Gavin Potters and the decision was made to continue  with 400 mg of Motrin every 4 hours and also to  had 17 hydroxyprogesterone,  250 mg IM and continue this on a weekly basis. Both these therapies were  begun on April 19.  On the morning of April 20, the patient was comfortable,  having no lower abdominal pain and her magnesium sulfate was discontinued  and ultimately she was discharged home.   She will continue on her Macrobid as previously directed, she will continue  on her prenatal vitamins as previously directed, she will continue on her  Motrin 400 mg every 4 hours for least one week, and she was also given a  prescription for terbutaline 2.5 mg to use every 4 hours only if absolutely  necessary. She has an appointment to follow-up in our office on April 24 and  it was arranged for her to continue her weekly 17 hydroxyprogesterone  injections in maternity admissions every Thursday at least through the mid  part of the third trimester.   CONDITION ON DISCHARGE:  Improved.      Gerrit Friends. Aldona Bar, M.D.  Electronically Signed     RMW/MEDQ  D:  01/19/2006  T:  01/19/2006  Job:  [id number]

## 2011-05-23 ENCOUNTER — Other Ambulatory Visit: Payer: Self-pay | Admitting: Internal Medicine

## 2011-05-23 DIAGNOSIS — N6489 Other specified disorders of breast: Secondary | ICD-10-CM

## 2011-05-29 ENCOUNTER — Ambulatory Visit
Admission: RE | Admit: 2011-05-29 | Discharge: 2011-05-29 | Disposition: A | Discharge status: Home / Self Care | Payer: Medicaid Other | Source: Ambulatory Visit | Attending: Internal Medicine | Admitting: Internal Medicine

## 2011-05-29 DIAGNOSIS — N6489 Other specified disorders of breast: Secondary | ICD-10-CM

## 2011-07-11 LAB — BASIC METABOLIC PANEL
CO2: 26
Chloride: 105
GFR calc Af Amer: >60
Potassium: 3.9
Sodium: 138

## 2011-07-11 LAB — CBC
HCT: 26.4 — ABNORMAL LOW
HCT: 26.8 — ABNORMAL LOW
HCT: 28.4 — ABNORMAL LOW
Hemoglobin: 10.2 — ABNORMAL LOW
MCV: 80.3
MCV: 80.4
Platelets: 207
Platelets: 217
RBC: 3.53 — ABNORMAL LOW
RBC: 4.57
RDW: 16.2 — ABNORMAL HIGH
RDW: 16.4 — ABNORMAL HIGH
RDW: 16.7 — ABNORMAL HIGH
WBC: 6.1
WBC: 7.2
WBC: 8.4

## 2011-07-11 LAB — DIFFERENTIAL
Basophils Absolute: 0
Lymphocytes Relative: 27
Monocytes Absolute: 0.5
Neutro Abs: 3.9
Neutrophils Relative %: 59

## 2011-07-14 LAB — CBC
HCT: 32.4 — ABNORMAL LOW
HCT: 29 — ABNORMAL LOW
Hemoglobin: 11.3 — ABNORMAL LOW
Hemoglobin: 9.9 — ABNORMAL LOW
MCHC: 34.1
MCV: 91.5
RBC: 3.6 — ABNORMAL LOW
RDW: 14.1 — ABNORMAL HIGH
WBC: 5.9

## 2011-07-17 LAB — CBC
HCT: 33.4 — ABNORMAL LOW
HCT: 32.6 — ABNORMAL LOW
Hemoglobin: 11.6 — ABNORMAL LOW
Hemoglobin: 11.5 — ABNORMAL LOW
MCHC: 34.8
MCHC: 35.1
MCV: 91.7
MCV: 89.7
Platelets: 388
Platelets: 263
RBC: 3.64 — ABNORMAL LOW
RBC: 3.64 — ABNORMAL LOW
RDW: 13.7
RDW: 14
WBC: 9.2
WBC: 7.7

## 2011-07-17 LAB — WET PREP, GENITAL
Clue Cells Wet Prep HPF POC: NONE SEEN
Trich, Wet Prep: NONE SEEN

## 2011-07-17 LAB — URINALYSIS, ROUTINE W REFLEX MICROSCOPIC
Bilirubin Urine: NEGATIVE
Glucose, UA: NEGATIVE
Hgb urine dipstick: NEGATIVE
Ketones, ur: NEGATIVE
Nitrite: NEGATIVE
Protein, ur: NEGATIVE
Specific Gravity, Urine: 1.028
Urobilinogen, UA: 0.2
pH: 6

## 2011-07-17 LAB — DIFFERENTIAL
Basophils Relative: 0
Lymphs Abs: 1.6
Monocytes Absolute: 0.6
Monocytes Relative: 8
Neutro Abs: 5.2
Neutrophils Relative %: 67

## 2011-07-17 LAB — POCT PREGNANCY, URINE
Operator id: 29026
Preg Test, Ur: NEGATIVE

## 2011-07-17 LAB — GC/CHLAMYDIA PROBE AMP, GENITAL
Chlamydia, DNA Probe: NEGATIVE
GC Probe Amp, Genital: NEGATIVE

## 2013-04-14 ENCOUNTER — Ambulatory Visit (HOSPITAL_COMMUNITY)
Admission: RE | Admit: 2013-04-14 | Discharge: 2013-04-14 | Disposition: A | Discharge status: Home / Self Care | Payer: BC Managed Care – PPO | Source: Ambulatory Visit | Attending: Internal Medicine | Admitting: Internal Medicine

## 2013-04-14 ENCOUNTER — Other Ambulatory Visit (HOSPITAL_COMMUNITY): Payer: Self-pay | Admitting: Internal Medicine

## 2013-04-14 DIAGNOSIS — M7989 Other specified soft tissue disorders: Secondary | ICD-10-CM

## 2013-04-14 DIAGNOSIS — M79609 Pain in unspecified limb: Secondary | ICD-10-CM

## 2013-04-14 DIAGNOSIS — M25562 Pain in left knee: Secondary | ICD-10-CM

## 2013-04-14 DIAGNOSIS — I83893 Varicose veins of bilateral lower extremities with other complications: Secondary | ICD-10-CM | POA: Insufficient documentation

## 2013-04-14 NOTE — Progress Notes (Signed)
*  PRELIMINARY RESULTS* Vascular Ultrasound Left lower extremity venous duplex has been completed.  Preliminary findings: negative for DVT and baker's cyst. 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 The GSV is patent.  Called report to Dr. Mikeal Hawthorne.   Farrel Demark, RDMS, RVT  04/14/2013, 3:01 PM

## 2013-04-24 ENCOUNTER — Emergency Department (HOSPITAL_COMMUNITY)
Admission: EM | Admit: 2013-04-24 | Discharge: 2013-04-24 | Disposition: A | Discharge status: Home / Self Care | Payer: BC Managed Care – PPO | Attending: Emergency Medicine | Admitting: Emergency Medicine

## 2013-04-24 ENCOUNTER — Encounter (HOSPITAL_COMMUNITY): Payer: Self-pay

## 2013-04-24 DIAGNOSIS — Z862 Personal history of diseases of the blood and blood-forming organs and certain disorders involving the immune mechanism: Secondary | ICD-10-CM | POA: Insufficient documentation

## 2013-04-24 DIAGNOSIS — M79609 Pain in unspecified limb: Secondary | ICD-10-CM | POA: Insufficient documentation

## 2013-04-24 DIAGNOSIS — M79605 Pain in left leg: Secondary | ICD-10-CM

## 2013-04-24 DIAGNOSIS — Z7982 Long term (current) use of aspirin: Secondary | ICD-10-CM | POA: Insufficient documentation

## 2013-04-24 DIAGNOSIS — Z8639 Personal history of other endocrine, nutritional and metabolic disease: Secondary | ICD-10-CM | POA: Insufficient documentation

## 2013-04-24 DIAGNOSIS — I10 Essential (primary) hypertension: Secondary | ICD-10-CM | POA: Insufficient documentation

## 2013-04-24 HISTORY — DX: Gout, unspecified: M10.9

## 2013-04-24 HISTORY — DX: Essential (primary) hypertension: I10

## 2013-04-24 LAB — POCT I-STAT, CHEM 8
Chloride: 98 mEq/L (ref 96–112)
Glucose, Bld: 90 mg/dL (ref 70–99)
HCT: 44 % (ref 36.0–46.0)
Potassium: 3.5 mEq/L (ref 3.5–5.1)

## 2013-04-24 MED ORDER — KETOROLAC TROMETHAMINE 30 MG/ML IJ SOLN
15.0000 mg | Freq: Once | INTRAMUSCULAR | Status: AC
  Administered 2013-04-24: 15 mg via INTRAVENOUS
  Filled 2013-04-24: qty 1

## 2013-04-24 MED ORDER — ONDANSETRON 4 MG PO TBDP
4.0000 mg | ORAL_TABLET | Freq: Once | ORAL | Status: AC
  Administered 2013-04-24: 4 mg via ORAL
  Filled 2013-04-24: qty 1

## 2013-04-24 MED ORDER — MORPHINE SULFATE 4 MG/ML IJ SOLN
4.0000 mg | Freq: Once | INTRAMUSCULAR | Status: AC
  Administered 2013-04-24: 4 mg via INTRAVENOUS
  Filled 2013-04-24: qty 1

## 2013-04-24 NOTE — ED Notes (Signed)
Pt complains of phlebitis in left leg and sts gout and thyroid problems.

## 2013-04-24 NOTE — ED Provider Notes (Signed)
History    CSN: [id number] Arrival date & time 04/24/13  0411  First MD Initiated Contact with Patient 04/24/13 0445     Chief Complaint  Patient presents with  . Leg Pain   (Consider location/radiation/quality/duration/timing/severity/associated sxs/prior Treatment) HPI  Amanda Cardenas is a 47 y.o. female complaining of left leg pain. Patient states she has phlebitis is being treated as an outpatient at the vein center. Patient also reports pain to the left great toe, she states this is consistent with prior gouty exacerbations. Patient denies fever, weakness, numbness, tingling, difficulty ambulating, chest pain, shortness of breath, nausea vomiting, change in bowel or bladder habits. States the pain is 10 out of 10, cannot identify any exacerbating or alleviating factors. Described as burning and pressure-like. Patient's taking 10 mg Norco with little relief.   Past Medical History  Diagnosis Date  . Hypertension   . Gout    History reviewed. No pertinent past surgical history. History reviewed. No pertinent family history. History  Substance Use Topics  . Smoking status: Never Smoker   . Smokeless tobacco: Not on file  . Alcohol Use: No   OB History   Grav Para Term Preterm Abortions TAB SAB Ect Mult Living                 Review of Systems  Constitutional: Negative for fever.  Respiratory: Negative for shortness of breath.   Cardiovascular: Negative for chest pain.  Gastrointestinal: Negative for nausea, vomiting, abdominal pain and diarrhea.  All other systems reviewed and are negative.    Allergies  Review of patient's allergies indicates no known allergies.  Home Medications   Current Outpatient Rx  Name  Route  Sig  Dispense  Refill  . aspirin 325 MG tablet   Oral   Take 325 mg by mouth daily.         Marland Kitchen atenolol (TENORMIN) 25 MG tablet   Oral   Take 25 mg by mouth daily.         . hydrochlorothiazide (HYDRODIURIL) 25 MG tablet   Oral   Take 25  mg by mouth daily.         Marland Kitchen HYDROcodone-acetaminophen (NORCO) 10-325 MG per tablet   Oral   Take 1 tablet by mouth every 6 (six) hours as needed for pain.         Marland Kitchen lisinopril (PRINIVIL,ZESTRIL) 20 MG tablet   Oral   Take 20 mg by mouth daily.         . sertraline (ZOLOFT) 100 MG tablet   Oral   Take 100 mg by mouth daily.          BP 144/109  Pulse 98  Temp(Src) 98.5 F (36.9 C) (Oral)  Resp 18  SpO2 96% Physical Exam  Nursing note and vitals reviewed. Constitutional: She is oriented to person, place, and time. She appears well-developed and well-nourished.  Crying, distressed.  HENT:  Head: Normocephalic and atraumatic.  Mouth/Throat: Oropharynx is clear and moist.  Eyes: Conjunctivae and EOM are normal. Pupils are equal, round, and reactive to light.  Cardiovascular: Normal rate and intact distal pulses.   Pulmonary/Chest: Effort normal and breath sounds normal. No stridor. No respiratory distress. She has no wheezes. She has no rales.  Abdominal: Soft. Bowel sounds are normal.  Musculoskeletal: Normal range of motion. She exhibits no edema.  Patient has diffuse tenderness to palpation along the left medial aspect of the leg. Left great toe is erythematous and tender to  palpation, slightly warm. Neurovascularly intact.  Neurological: She is alert and oriented to person, place, and time.  Psychiatric: She has a normal mood and affect.    ED Course  Procedures (including critical care time) Labs Reviewed - No data to display No results found. 1. Leg pain, left     MDM   Filed Vitals:   04/24/13 0615 04/24/13 0630 04/24/13 0645 04/24/13 0700  BP: 152/113 163/124 145/99 125/83  Pulse: 75 95 71 76  Temp:      TempSrc:      Resp:  25  18  SpO2: 100% 95% 96% 98%     Amanda Cardenas is a 47 y.o. female with pain exacerbation from superficial phlebitis and gout. Patient has received morphine, and Toradol. Pain is improved. Reliable for outpatient followup with  Dr. Mikeal Hawthorne.  Medications  morphine 4 MG/ML injection 4 mg (4 mg Intravenous Given 04/24/13 0547)  ondansetron (ZOFRAN-ODT) disintegrating tablet 4 mg (4 mg Oral Given 04/24/13 0537)  ketorolac (TORADOL) 30 MG/ML injection 15 mg (15 mg Intravenous Given 04/24/13 0634)  ketorolac (TORADOL) 30 MG/ML injection 15 mg (15 mg Intravenous Given 04/24/13 0720)    Pt is hemodynamically stable, appropriate for, and amenable to discharge at this time. Pt verbalized understanding and agrees with care plan. All questions answered. Outpatient follow-up and specific return precautions discussed.    Note: Portions of this report may have been transcribed using voice recognition software. Every effort was made to ensure accuracy; however, inadvertent computerized transcription errors may be present    Wynetta Emery, PA-C 04/24/13 1958

## 2013-04-24 NOTE — ED Notes (Signed)
Pt states he pain is increasing in her foot. Pt states she needs one more dose until she sees her MD in the morning. MD Molpus at bedside

## 2013-04-25 NOTE — ED Provider Notes (Signed)
Medical screening examination/treatment/procedure(s) were performed by non-physician practitioner and as supervising physician I was immediately available for consultation/collaboration.   Hanley Seamen, MD 04/25/13 [phone]

## 2013-05-09 ENCOUNTER — Other Ambulatory Visit (HOSPITAL_COMMUNITY): Payer: Self-pay | Admitting: Internal Medicine

## 2013-05-09 DIAGNOSIS — E0590 Thyrotoxicosis, unspecified without thyrotoxic crisis or storm: Secondary | ICD-10-CM

## 2013-05-13 ENCOUNTER — Ambulatory Visit (HOSPITAL_COMMUNITY)
Admission: RE | Admit: 2013-05-13 | Discharge: 2013-05-13 | Disposition: A | Discharge status: Home / Self Care | Payer: BC Managed Care – PPO | Source: Ambulatory Visit | Attending: Internal Medicine | Admitting: Internal Medicine

## 2013-05-13 DIAGNOSIS — E0590 Thyrotoxicosis, unspecified without thyrotoxic crisis or storm: Secondary | ICD-10-CM

## 2013-05-13 DIAGNOSIS — E042 Nontoxic multinodular goiter: Secondary | ICD-10-CM | POA: Insufficient documentation

## 2013-05-29 ENCOUNTER — Other Ambulatory Visit: Payer: Self-pay | Admitting: Internal Medicine

## 2013-05-29 DIAGNOSIS — E041 Nontoxic single thyroid nodule: Secondary | ICD-10-CM

## 2013-06-03 ENCOUNTER — Other Ambulatory Visit (HOSPITAL_COMMUNITY)
Admission: RE | Admit: 2013-06-03 | Discharge: 2013-06-03 | Disposition: A | Discharge status: Home / Self Care | Payer: BC Managed Care – PPO | Source: Ambulatory Visit | Attending: Interventional Radiology | Admitting: Interventional Radiology

## 2013-06-03 ENCOUNTER — Ambulatory Visit
Admission: RE | Admit: 2013-06-03 | Discharge: 2013-06-03 | Disposition: A | Discharge status: Home / Self Care | Payer: BC Managed Care – PPO | Source: Ambulatory Visit | Attending: Internal Medicine | Admitting: Internal Medicine

## 2013-06-03 DIAGNOSIS — E041 Nontoxic single thyroid nodule: Secondary | ICD-10-CM | POA: Insufficient documentation

## 2013-08-07 ENCOUNTER — Other Ambulatory Visit: Payer: Self-pay

## 2015-10-18 ENCOUNTER — Encounter (HOSPITAL_COMMUNITY): Payer: Self-pay | Admitting: Emergency Medicine

## 2015-10-18 ENCOUNTER — Emergency Department (HOSPITAL_COMMUNITY)
Admission: EM | Admit: 2015-10-18 | Discharge: 2015-10-18 | Disposition: A | Discharge status: Home / Self Care | Payer: Self-pay | Attending: Emergency Medicine | Admitting: Emergency Medicine

## 2015-10-18 DIAGNOSIS — M109 Gout, unspecified: Secondary | ICD-10-CM | POA: Insufficient documentation

## 2015-10-18 DIAGNOSIS — Z79899 Other long term (current) drug therapy: Secondary | ICD-10-CM | POA: Insufficient documentation

## 2015-10-18 DIAGNOSIS — R1012 Left upper quadrant pain: Secondary | ICD-10-CM | POA: Insufficient documentation

## 2015-10-18 DIAGNOSIS — R197 Diarrhea, unspecified: Secondary | ICD-10-CM

## 2015-10-18 DIAGNOSIS — R112 Nausea with vomiting, unspecified: Secondary | ICD-10-CM

## 2015-10-18 DIAGNOSIS — R509 Fever, unspecified: Secondary | ICD-10-CM | POA: Insufficient documentation

## 2015-10-18 DIAGNOSIS — I10 Essential (primary) hypertension: Secondary | ICD-10-CM | POA: Insufficient documentation

## 2015-10-18 DIAGNOSIS — R1032 Left lower quadrant pain: Secondary | ICD-10-CM

## 2015-10-18 LAB — URINALYSIS, ROUTINE W REFLEX MICROSCOPIC
Bilirubin Urine: NEGATIVE
GLUCOSE, UA: NEGATIVE mg/dL
KETONES UR: NEGATIVE mg/dL
LEUKOCYTES UA: NEGATIVE
Nitrite: NEGATIVE
PH: 6.5 (ref 5.0–8.0)
Protein, ur: NEGATIVE mg/dL
Specific Gravity, Urine: 1.007 (ref 1.005–1.030)

## 2015-10-18 LAB — COMPREHENSIVE METABOLIC PANEL
ALBUMIN: 3.8 g/dL (ref 3.5–5.0)
ALK PHOS: 54 U/L (ref 38–126)
ALT: 36 U/L (ref 14–54)
ANION GAP: 10 (ref 5–15)
AST: 27 U/L (ref 15–41)
BUN: 9 mg/dL (ref 6–20)
CHLORIDE: 106 mmol/L (ref 101–111)
CO2: 24 mmol/L (ref 22–32)
Calcium: 9.2 mg/dL (ref 8.9–10.3)
Creatinine, Ser: 0.74 mg/dL (ref 0.44–1.00)
GFR calc Af Amer: >60 mL/min (ref >60)
GFR calc non Af Amer: >60 mL/min (ref >60)
GLUCOSE: 111 mg/dL — AB (ref 65–99)
POTASSIUM: 3.8 mmol/L (ref 3.5–5.1)
SODIUM: 140 mmol/L (ref 135–145)
Total Bilirubin: 0.7 mg/dL (ref 0.3–1.2)
Total Protein: 6.8 g/dL (ref 6.5–8.1)

## 2015-10-18 LAB — CBC
HEMATOCRIT: 37.4 % (ref 36.0–46.0)
HEMOGLOBIN: 12.3 g/dL (ref 12.0–15.0)
MCH: 30.9 pg (ref 26.0–34.0)
MCHC: 32.9 g/dL (ref 30.0–36.0)
MCV: 94 fL (ref 78.0–100.0)
Platelets: 263 10*3/uL (ref 150–400)
RBC: 3.98 MIL/uL (ref 3.87–5.11)
RDW: 12.7 % (ref 11.5–15.5)
WBC: 10.7 10*3/uL — ABNORMAL HIGH (ref 4.0–10.5)

## 2015-10-18 LAB — URINE MICROSCOPIC-ADD ON

## 2015-10-18 LAB — LIPASE, BLOOD: LIPASE: 20 U/L (ref 11–51)

## 2015-10-18 MED ORDER — ONDANSETRON HCL 4 MG/2ML IJ SOLN
4.0000 mg | Freq: Once | INTRAMUSCULAR | Status: AC | PRN
  Administered 2015-10-18: 4 mg via INTRAVENOUS
  Filled 2015-10-18: qty 2

## 2015-10-18 MED ORDER — PROMETHAZINE HCL 25 MG PO TABS: 25.0000 mg | ORAL_TABLET | Freq: Four times a day (QID) | ORAL | Status: DC | PRN

## 2015-10-18 MED ORDER — SODIUM CHLORIDE 0.9 % IV BOLUS (SEPSIS)
500.0000 mL | Freq: Once | INTRAVENOUS | Status: AC
  Administered 2015-10-18: 500 mL via INTRAVENOUS

## 2015-10-18 MED ORDER — DICYCLOMINE HCL 10 MG PO CAPS
10.0000 mg | ORAL_CAPSULE | Freq: Once | ORAL | Status: AC
  Administered 2015-10-18: 10 mg via ORAL
  Filled 2015-10-18: qty 1

## 2015-10-18 MED ORDER — PREDNISONE 20 MG PO TABS: 20.0000 mg | ORAL_TABLET | Freq: Two times a day (BID) | ORAL | Status: DC

## 2015-10-18 MED ORDER — DICYCLOMINE HCL 20 MG PO TABS: 20.0000 mg | ORAL_TABLET | Freq: Two times a day (BID) | ORAL | Status: DC

## 2015-10-18 MED ORDER — BUPROPION HCL ER (SR) 150 MG PO TB12
150.0000 mg | ORAL_TABLET | Freq: Two times a day (BID) | ORAL | Status: DC
  Administered 2015-10-18: 150 mg via ORAL
  Filled 2015-10-18 (×2): qty 1

## 2015-10-18 MED ORDER — LISINOPRIL 20 MG PO TABS
20.0000 mg | ORAL_TABLET | Freq: Every day | ORAL | Status: DC
  Administered 2015-10-18: 20 mg via ORAL
  Filled 2015-10-18: qty 1

## 2015-10-18 MED ORDER — PROMETHAZINE HCL 25 MG/ML IJ SOLN
25.0000 mg | Freq: Once | INTRAMUSCULAR | Status: AC
  Administered 2015-10-18: 25 mg via INTRAVENOUS
  Filled 2015-10-18: qty 1

## 2015-10-18 MED ORDER — METHYLPREDNISOLONE SODIUM SUCC 125 MG IJ SOLR
125.0000 mg | Freq: Once | INTRAMUSCULAR | Status: AC
  Administered 2015-10-18: 125 mg via INTRAVENOUS
  Filled 2015-10-18: qty 2

## 2015-10-18 MED ORDER — HYDROCHLOROTHIAZIDE 25 MG PO TABS
25.0000 mg | ORAL_TABLET | Freq: Every day | ORAL | Status: DC | PRN
  Filled 2015-10-18: qty 1

## 2015-10-18 MED ORDER — ATENOLOL 25 MG PO TABS
25.0000 mg | ORAL_TABLET | Freq: Every day | ORAL | Status: DC
  Administered 2015-10-18: 25 mg via ORAL
  Filled 2015-10-18: qty 1

## 2015-10-18 MED ORDER — ACETAMINOPHEN 500 MG PO TABS
1000.0000 mg | ORAL_TABLET | Freq: Once | ORAL | Status: AC
  Administered 2015-10-18: 1000 mg via ORAL
  Filled 2015-10-18: qty 2

## 2015-10-18 NOTE — ED Notes (Signed)
Pt reports left abdominal pain onset Friday with n/v/d.

## 2015-10-18 NOTE — Discharge Instructions (Signed)
You have been seen today for abdominal pain, nausea, vomiting, and diarrhea. Your lab tests showed no abnormalities. Follow up with PCP as needed. Return to ED should symptoms worsen.

## 2015-10-18 NOTE — ED Provider Notes (Signed)
CSN: [id number]     Arrival date & time 10/18/15  [id number] History   First MD Initiated Contact with Patient 10/18/15 [phone]     Chief Complaint  Patient presents with  . Abdominal Pain     (Consider location/radiation/quality/duration/timing/severity/associated sxs/prior Treatment) HPI   Amanda Cardenas is a 50 y.o. female, with a history of HTN and Gout, presenting to the ED with N/V/D, fever, and LUQ abdominal pain for about two weeks. Pt rates her abdominal pain at 7/10 when she is walking around, but 0/10 when she is lying down, cramping, non-radiating. Pt endorses about 5 loose stools in the last 24 hours. Tmax was 100.8 yesterday. 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 Pt has been in menopause for 4 years. Denies vaginal bleeding/discharge, hematochezia, chest pain, shortness of breath, dysuria/hematuria/frequency, or any other complaints. Pt has tried phenergan and keflex for her nausea and her fever.    Past Medical History  Diagnosis Date  . Hypertension   . Gout    History reviewed. No pertinent past surgical history. No family history on file. Social History  Substance Use Topics  . Smoking status: Never Smoker   . Smokeless tobacco: None  . Alcohol Use: No   OB History    No data available     Review of Systems  Constitutional: Positive for fever. Negative for chills.  Respiratory: Negative for shortness of breath.   Cardiovascular: Negative for chest pain.  Gastrointestinal: Positive for nausea, vomiting, abdominal pain and diarrhea. Negative for blood in stool.  Genitourinary: Negative for dysuria, hematuria, flank pain, vaginal bleeding, vaginal discharge and vaginal pain.  Musculoskeletal: Negative for back pain.  Neurological: Negative for dizziness, light-headedness and headaches.  All other systems reviewed and are  negative.     Allergies  Review of patient's allergies indicates no known allergies.  Home Medications   Prior to Admission medications   Medication Sig Start Date End Date Taking? Authorizing Provider  allopurinol (ZYLOPRIM) 300 MG tablet Take 300 mg by mouth daily.   Yes Historical Provider, MD  amphetamine-dextroamphetamine (ADDERALL) 30 MG tablet Take 30 mg by mouth 2 (two) times daily.   Yes Historical Provider, MD  atenolol (TENORMIN) 25 MG tablet Take 25 mg by mouth daily.   Yes Historical Provider, MD  buPROPion (WELLBUTRIN SR) 150 MG 12 hr tablet Take 150 mg by mouth 2 (two) times daily.   Yes Historical Provider, MD  Cholecalciferol (VITAMIN D PO) Take 1 capsule by mouth daily.   Yes Historical Provider, MD  hydrochlorothiazide (HYDRODIURIL) 25 MG tablet Take 25 mg by mouth daily as needed (for fluid).    Yes Historical Provider, MD  lisinopril (PRINIVIL,ZESTRIL) 20 MG tablet Take 20 mg by mouth daily.   Yes Historical Provider, MD  sertraline (ZOLOFT) 100 MG tablet Take 100 mg by mouth daily.   Yes Historical Provider, MD  dicyclomine (BENTYL) 20 MG tablet Take 1 tablet (20 mg total) by mouth 2 (two) times daily. 10/18/15   Saria Haran C Shykeem Resurreccion, PA-C  predniSONE (DELTASONE) 20 MG tablet Take 1 tablet (20 mg total) by mouth 2 (two) times daily with a meal. 10/18/15   Maiko Salais C Amalee Olsen, PA-C  promethazine (PHENERGAN) 25 MG tablet Take 1 tablet (25 mg total) by mouth every 6 (six) hours as needed for nausea or vomiting. 10/18/15   Lusia Greis  C Nevae Pinnix, PA-C   BP 150/97 mmHg  Pulse 87  Temp(Src) 98 F (36.7 C) (Oral)  Resp 18  SpO2 97%  LMP 10/18/2011 Physical Exam  Constitutional: She appears well-developed and well-nourished. No distress.  HENT:  Head: Normocephalic and atraumatic.  Eyes: Conjunctivae are normal. Pupils are equal, round, and reactive to light.  Cardiovascular: Normal rate, regular rhythm, normal heart sounds and intact distal pulses.   Pulmonary/Chest: Effort normal and breath  sounds normal. No respiratory distress.  Abdominal: Soft. Bowel sounds are normal. There is tenderness in the left upper quadrant and left lower quadrant. There is no CVA tenderness.  Aorta unable to be assessed on physical exam due to body habitus.  Musculoskeletal: She exhibits no edema or tenderness.  Neurological: She is alert.  Skin: Skin is warm and dry. She is not diaphoretic.  Nursing note and vitals reviewed.   ED Course  Procedures (including critical care time) Labs Review Labs Reviewed  COMPREHENSIVE METABOLIC PANEL - Abnormal; Notable for the following:    Glucose, Bld 111 (*)    All other components within normal limits  CBC - Abnormal; Notable for the following:    WBC 10.7 (*)    All other components within normal limits  URINALYSIS, ROUTINE W REFLEX MICROSCOPIC (NOT AT [facility]) - Abnormal; Notable for the following:    Hgb urine dipstick MODERATE (*)    All other components within normal limits  URINE MICROSCOPIC-ADD ON - Abnormal; Notable for the following:    Squamous Epithelial / LPF 0-5 (*)    Bacteria, UA RARE (*)    All other components within normal limits  LIPASE, BLOOD    Imaging Review No results found. I have personally reviewed and evaluated these lab results as part of my medical decision-making.   EKG Interpretation None      MDM   Final diagnoses:  Left lower quadrant pain  Non-intractable vomiting with nausea, vomiting of unspecified type  Diarrhea, unspecified type    Amanda Cardenas presents with left sided abdominal pain, nausea, vomiting, diarrhea, and fever for the last two weeks.  Findings and plan of care discussed with Daleen Bo, MD.  Patient is nontoxic appearing, afebrile, not tachycardic, not tachypneic, maintains SPO2 of 97-100%, is in no apparent distress, and has no red flag symptoms. Patient fits no criteria for imaging at this time. IV fluids, nausea control, and observe here in the ED. patient's home BP meds were also  ordered due to the patient being unable to take them prior to arrival. Dr. Eulis Foster received a phone call from Dr. Jonelle Sidle, who requested that we administer IV Solu-Medrol and then send the patient home with a prednisone regimen for possible gout. Patient was reassessed and found to have improved with both her pain and her nausea. Patient stated that she feels ready to go home and rest. The patient was given instructions for home care as well as return precautions. Patient voices understanding of these instructions, accepts the plan, and is comfortable with discharge.  Filed Vitals:   10/18/15 0827 10/18/15 1015 10/18/15 1017  BP: 145/114 150/97 150/97  Pulse: 97 87 87  Temp: 98 F (36.7 C)    TempSrc: Oral    Resp: 16 18   SpO2: 100% 97%      Lorayne Bender, PA-C 10/18/15 1212  Daleen Bo, MD 10/18/15 1651

## 2015-10-18 NOTE — ED Provider Notes (Signed)
  Face-to-face evaluation   History: She complains of several days of nausea, vomiting, diarrhea, and abdominal distention. She states that her creatinine level is elevated, when it was checked, by her doctor.  Physical exam: Alert, calm, cooperative. Abdomen is soft and there is no focal tenderness.  Medical screening examination/treatment/procedure(s) were conducted as a shared visit with non-physician practitioner(s) and myself.  I personally evaluated the patient during the encounter  Daleen Bo, MD 10/18/15 1651

## 2016-08-02 DIAGNOSIS — Z9889 Other specified postprocedural states: Secondary | ICD-10-CM | POA: Insufficient documentation

## 2016-08-02 HISTORY — DX: Other specified postprocedural states: Z98.890

## 2016-12-24 ENCOUNTER — Emergency Department (HOSPITAL_BASED_OUTPATIENT_CLINIC_OR_DEPARTMENT_OTHER)
Admission: EM | Admit: 2016-12-24 | Discharge: 2016-12-24 | Disposition: A | Discharge status: Home / Self Care | Payer: Medicaid Other | Source: Home / Self Care | Attending: Emergency Medicine | Admitting: Emergency Medicine

## 2016-12-24 ENCOUNTER — Emergency Department (HOSPITAL_COMMUNITY): Payer: Medicaid Other

## 2016-12-24 ENCOUNTER — Emergency Department (HOSPITAL_COMMUNITY)
Admission: EM | Admit: 2016-12-24 | Discharge: 2016-12-24 | Disposition: A | Discharge status: Home / Self Care | Payer: Medicaid Other | Attending: Emergency Medicine | Admitting: Emergency Medicine

## 2016-12-24 ENCOUNTER — Encounter (HOSPITAL_COMMUNITY): Payer: Self-pay

## 2016-12-24 DIAGNOSIS — M79609 Pain in unspecified limb: Secondary | ICD-10-CM | POA: Diagnosis not present

## 2016-12-24 DIAGNOSIS — R451 Restlessness and agitation: Secondary | ICD-10-CM | POA: Insufficient documentation

## 2016-12-24 DIAGNOSIS — Z79899 Other long term (current) drug therapy: Secondary | ICD-10-CM | POA: Diagnosis not present

## 2016-12-24 DIAGNOSIS — M79672 Pain in left foot: Secondary | ICD-10-CM

## 2016-12-24 DIAGNOSIS — M79671 Pain in right foot: Secondary | ICD-10-CM | POA: Insufficient documentation

## 2016-12-24 DIAGNOSIS — I10 Essential (primary) hypertension: Secondary | ICD-10-CM | POA: Diagnosis not present

## 2016-12-24 DIAGNOSIS — R52 Pain, unspecified: Secondary | ICD-10-CM

## 2016-12-24 LAB — CBC WITH DIFFERENTIAL/PLATELET
BASOS ABS: 0 10*3/uL (ref 0.0–0.1)
Basophils Relative: 0 %
EOS ABS: 0.3 10*3/uL (ref 0.0–0.7)
EOS PCT: 3 %
HEMATOCRIT: 42.1 % (ref 36.0–46.0)
Hemoglobin: 14 g/dL (ref 12.0–15.0)
Lymphocytes Relative: 17 %
Lymphs Abs: 1.9 10*3/uL (ref 0.7–4.0)
MCH: 32 pg (ref 26.0–34.0)
MCHC: 33.3 g/dL (ref 30.0–36.0)
MCV: 96.3 fL (ref 78.0–100.0)
MONO ABS: 1 10*3/uL (ref 0.1–1.0)
MONOS PCT: 9 %
NEUTROS ABS: 7.8 10*3/uL — AB (ref 1.7–7.7)
Neutrophils Relative %: 71 %
PLATELETS: 265 10*3/uL (ref 150–400)
RBC: 4.37 MIL/uL (ref 3.87–5.11)
RDW: 13.2 % (ref 11.5–15.5)
WBC: 11 10*3/uL — ABNORMAL HIGH (ref 4.0–10.5)

## 2016-12-24 LAB — LIPASE, BLOOD: LIPASE: 28 U/L (ref 11–51)

## 2016-12-24 LAB — PROTIME-INR
INR: 0.91
PROTHROMBIN TIME: 12.2 s (ref 11.4–15.2)

## 2016-12-24 LAB — COMPREHENSIVE METABOLIC PANEL
ALT: 17 U/L (ref 14–54)
ANION GAP: 7 (ref 5–15)
AST: 15 U/L (ref 15–41)
Albumin: 4.4 g/dL (ref 3.5–5.0)
Alkaline Phosphatase: 58 U/L (ref 38–126)
BILIRUBIN TOTAL: 0.9 mg/dL (ref 0.3–1.2)
BUN: 21 mg/dL — ABNORMAL HIGH (ref 6–20)
CALCIUM: 9.4 mg/dL (ref 8.9–10.3)
CO2: 27 mmol/L (ref 22–32)
CREATININE: 0.93 mg/dL (ref 0.44–1.00)
Chloride: 104 mmol/L (ref 101–111)
GFR calc Af Amer: >60 mL/min (ref >60)
GFR calc non Af Amer: >60 mL/min (ref >60)
Glucose, Bld: 110 mg/dL — ABNORMAL HIGH (ref 65–99)
POTASSIUM: 4.7 mmol/L (ref 3.5–5.1)
Sodium: 138 mmol/L (ref 135–145)
TOTAL PROTEIN: 7.7 g/dL (ref 6.5–8.1)

## 2016-12-24 MED ORDER — MORPHINE SULFATE (PF) 4 MG/ML IV SOLN
4.0000 mg | Freq: Once | INTRAVENOUS | Status: AC
  Administered 2016-12-24: 4 mg via INTRAVENOUS
  Filled 2016-12-24: qty 1

## 2016-12-24 MED ORDER — LORAZEPAM 1 MG PO TABS: 1.0000 mg | ORAL_TABLET | Freq: Three times a day (TID) | ORAL | 0 refills | Status: DC | PRN

## 2016-12-24 MED ORDER — MORPHINE SULFATE (PF) 4 MG/ML IV SOLN
4.0000 mg | Freq: Once | INTRAVENOUS | Status: AC
Start: 2016-12-24 — End: 2016-12-24
  Administered 2016-12-24: 4 mg via INTRAVENOUS
  Filled 2016-12-24: qty 1

## 2016-12-24 MED ORDER — OXYCODONE-ACETAMINOPHEN 5-325 MG PO TABS: 1.0000 | ORAL_TABLET | ORAL | 0 refills | Status: DC | PRN

## 2016-12-24 MED ORDER — IOPAMIDOL (ISOVUE-370) INJECTION 76%
INTRAVENOUS | Status: AC
  Administered 2016-12-24: 125 mL via INTRAVENOUS
  Filled 2016-12-24: qty 100

## 2016-12-24 MED ORDER — LORAZEPAM 1 MG PO TABS
1.0000 mg | ORAL_TABLET | Freq: Once | ORAL | Status: AC
  Administered 2016-12-24: 1 mg via ORAL
  Filled 2016-12-24: qty 1

## 2016-12-24 NOTE — ED Provider Notes (Signed)
[facility] DEPT Provider Note   CSN: [id number] Arrival date & time: 12/24/16  0719     History   Chief Complaint Chief Complaint  Patient presents with  . Foot Pain    HPI Zenith H Lacina is a 51 y.o. female with a past medical history significant for hypertension and gout who presents with bilateral foot pain. Patient says that over the last 4 days, she has had a new foot pain. She says it has been up to 9 out of 10 severity and has been constant. 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 She says that nothing has made it better or worse. Positioning has not helped. She says the pain does not radiate up her legs. She says that Tylenol and ibuprofen have not helped. Is that this pain is very different than any gout pain she has had in the past. She says that intermittently, she noticed her feet were changing color. She said they were both purple on the entirety of the foot yesterday and the left was worse than the right. She also says that her left great toe was nearly black in color. She reports this has never happened in the past. She denies any abdominal pain. She does report some recent nausea that has improved. She denies chest pain or shortness of breath but does report a chronic cough for the last month. She denies fevers, chills, constipation, diarrhea, or dysuria. She reports chronic bilateral hip pain. She says her feet look a little bit more swollen bilaterally. She says the pain is currently an 8 out of 10 in severity.   The history is provided by the patient and medical records. No language interpreter was used.  Foot Pain  This is a new problem. The current episode started more than 2 days ago. The problem occurs constantly. The problem has not changed since onset.Pertinent negatives include no chest pain, no abdominal pain, no headaches and no shortness of breath. The symptoms are aggravated by standing. Nothing relieves the symptoms.  She has tried nothing for the symptoms. The treatment provided no relief.    Past Medical History:  Diagnosis Date  . Gout   . Hypertension     There are no active problems to display for this patient.   No past surgical history on file.  OB History    No data available       Home Medications    Prior to Admission medications   Medication Sig Start Date End Date Taking? Authorizing Provider  allopurinol (ZYLOPRIM) 300 MG tablet Take 300 mg by mouth daily.    Historical Provider, MD  amphetamine-dextroamphetamine (ADDERALL) 30 MG tablet Take 30 mg by mouth 2 (two) times daily.    Historical Provider, MD  atenolol (TENORMIN) 25 MG tablet Take 25 mg by mouth daily.    Historical Provider, MD  buPROPion (WELLBUTRIN SR) 150 MG 12 hr tablet Take 150 mg by mouth 2 (two) times daily.    Historical Provider, MD  Cholecalciferol (VITAMIN D PO) Take 1 capsule by mouth daily.    Historical Provider, MD  dicyclomine (BENTYL) 20 MG tablet Take 1 tablet (20 mg total) by mouth 2 (two) times daily. 10/18/15   Shawn C Joy, PA-C  hydrochlorothiazide (HYDRODIURIL) 25 MG tablet Take 25 mg by mouth daily as needed (for fluid).     Historical Provider, MD  lisinopril (PRINIVIL,ZESTRIL) 20 MG tablet Take 20 mg by mouth daily.  Historical Provider, MD  predniSONE (DELTASONE) 20 MG tablet Take 1 tablet (20 mg total) by mouth 2 (two) times daily with a meal. 10/18/15   Shawn C Joy, PA-C  promethazine (PHENERGAN) 25 MG tablet Take 1 tablet (25 mg total) by mouth every 6 (six) hours as needed for nausea or vomiting. 10/18/15   Shawn C Joy, PA-C  sertraline (ZOLOFT) 100 MG tablet Take 100 mg by mouth daily.    Historical Provider, MD    Family History No family history on file.  Social History Social History  Substance Use Topics  . Smoking status: Never Smoker  . Smokeless tobacco: Not on file  . Alcohol use No     Allergies   Patient has no known allergies.   Review of Systems Review of  Systems  Constitutional: Negative for activity change, chills, diaphoresis, fatigue and fever.  HENT: Negative for congestion and rhinorrhea.   Eyes: Negative for visual disturbance.  Respiratory: Negative for cough, chest tightness, shortness of breath and stridor.   Cardiovascular: Negative for chest pain, palpitations and leg swelling.  Gastrointestinal: Positive for nausea. Negative for abdominal distention, abdominal pain, constipation, diarrhea and vomiting.  Genitourinary: Negative for difficulty urinating, dysuria, flank pain, frequency, hematuria, menstrual problem, pelvic pain, vaginal bleeding and vaginal discharge.  Musculoskeletal: Negative for back pain, neck pain and neck stiffness.  Skin: Positive for color change. Negative for rash and wound.  Neurological: Negative for dizziness, weakness, light-headedness, numbness and headaches.  Psychiatric/Behavioral: Negative for agitation and confusion.  All other systems reviewed and are negative.    Physical Exam Updated Vital Signs LMP 10/18/2011   Physical Exam  Constitutional: She is oriented to person, place, and time. She appears well-developed and well-nourished. No distress.  HENT:  Head: Normocephalic and atraumatic.  Right Ear: External ear normal.  Left Ear: External ear normal.  Nose: Nose normal.  Mouth/Throat: Oropharynx is clear and moist. No oropharyngeal exudate.  Eyes: Conjunctivae and EOM are normal. Pupils are equal, round, and reactive to light.  Neck: Normal range of motion. Neck supple.  Cardiovascular: Normal rate, normal heart sounds and intact distal pulses.   No murmur heard. Pulmonary/Chest: Effort normal and breath sounds normal. No stridor. No respiratory distress. She has no wheezes. She exhibits no tenderness.  Abdominal: Soft. She exhibits no distension. There is no tenderness. There is no rebound.  Musculoskeletal: She exhibits edema and tenderness.       Right foot: There is tenderness,  swelling and decreased capillary refill.       Left foot: There is tenderness, swelling and decreased capillary refill.  Bilateral lower extremities tender in the feet. Bilateral feet slightly darker in color. Left foot had no palpable pulses in the DP or PT. Both were found on Doppler ultrasound. Right foot had palpable pulses.    normal sensation and strength.  No wounds seen on the feet.  Neurological: She is alert and oriented to person, place, and time. She has normal reflexes. No sensory deficit. She exhibits normal muscle tone. Coordination normal.  Skin: Skin is warm. Capillary refill takes more than 3 seconds. No rash noted. She is not diaphoretic. No erythema.  Psychiatric: She has a normal mood and affect.  Nursing note and vitals reviewed.    ED Treatments / Results  Labs (all labs ordered are listed, but only abnormal results are displayed) Labs Reviewed  CBC WITH DIFFERENTIAL/PLATELET - Abnormal; Notable for the following:       Result Value  WBC 11.0 (*)    Neutro Abs 7.8 (*)    All other components within normal limits  COMPREHENSIVE METABOLIC PANEL - Abnormal; Notable for the following:    Glucose, Bld 110 (*)    BUN 21 (*)    All other components within normal limits  LIPASE, BLOOD  PROTIME-INR    EKG  EKG Interpretation None       Radiology Dg Chest 2 View  Result Date: 12/24/2016 CLINICAL DATA:  Cough for 1 month.  Intermittent fever EXAM: CHEST  2 VIEW COMPARISON:  None. FINDINGS: There is no edema or consolidation. Heart size and pulmonary vascularity are normal. No adenopathy. There is a hiatal hernia. No adenopathy. No bone lesions. Thoracic aorta is somewhat tortuous. IMPRESSION: Hiatal hernia. No edema or consolidation. Thoracic aorta somewhat tortuous. Electronically Signed   By: Lowella Grip III M.D.   On: 12/24/2016 09:08   Ct Angio Aortobifemoral W And/or Wo Contrast  Result Date: 12/24/2016 CLINICAL DATA:  Bilateral feet pain with  color change. Normal ankle-brachial indices at rest. EXAM: CT ANGIOGRAPHY OF THE CHEST CT ANGIOGRAPHY OF ABDOMINAL AORTA WITH ILIOFEMORAL RUNOFF TECHNIQUE: Multidetector CT imaging of the chest, abdomen, pelvis and lower extremities was performed using the standard protocol during bolus administration of intravenous contrast. Multiplanar CT image reconstructions and MIPs were obtained to evaluate the vascular anatomy. CONTRAST:  125 mL Isovue 370 COMPARISON:  CT 08/31/2009.  CT report from 01/17/1999 FINDINGS: VASCULAR Aorta: Motion artifact of the ascending thoracic aorta without evidence aneurysm or dissection. There appears to be a bovine type arch but there is a large amount of motion artifact at the great vessels area. Normal caliber of the abdominal aorta without aneurysm or dissection. The abdominal aorta is mildly tortuous. Pulmonary arteries:  Main pulmonary arteries are patent. Celiac: Separate origins of the splenic artery and common hepatic artery from the aorta. These branches are widely patent without atherosclerotic disease. Left gastric artery originates directly from the aorta. SMA: Patent without evidence of aneurysm, dissection, vasculitis or significant stenosis. Renals: Left renal artery appears to be atrophic and associated with a chronically atrophic left kidney. There is a single right renal artery which is patent without atherosclerotic disease or stenosis. IMA: IMA is patent. RIGHT Lower Extremity Inflow: Minimal atherosclerotic disease in the right internal iliac artery. The right common and right external iliac artery are widely patent without significant atherosclerotic disease. Outflow: Right common femoral artery is patent. The right deep femoral arteries are patent. SFA is widely patent without atherosclerotic disease. Popliteal artery is widely patent without atherosclerotic disease. Runoff: Three-vessel runoff in the right calf. LEFT Lower Extremity Inflow: Minimal atherosclerotic  disease in left common iliac artery without significant stenosis. Left internal iliac artery is patent. Left external iliac artery is patent without significant stenosis. Outflow: Minimal disease in left common femoral artery. Left profunda femoral arteries are patent. Left SFA is patent. Left popliteal artery is patent. Runoff: Large amount of venous contamination in left calf but there appears to be three-vessel runoff in the left calf. However, there may be a short-segment occlusion in the distal anterior tibial artery/proximal dorsalis pedis artery region. Veins: Venous varicosities along the bilateral medial thighs. These varicosities are associated with the great saphenous vein bilaterally. Varicosities are larger on the left than the right. Varicosities along the anterior and medial left calf. Left calf varicosities are associated with the great saphenous vein. Few varicosities along the medial right calf. Review of the MIP images confirms the above  findings. NON-VASCULAR Mediastinum/Nodes: Thyroid tissue is heterogeneous with bilateral low-density nodules. There is a large low-density structure in the prevascular space along the left anterior mediastinum. This structure is low density and measures 5.3 x 3.8 x 3.5 cm. Margins of this structure are well-defined except for the anterior aspect. Chest CT report from 01/17/1999 described a large left anterior mediastinal mass. These old comparison images are not available but suspect this is referring to the same structure. This probably represents a benign etiology such as a pericardial cyst. Otherwise, there is no significant chest lymphadenopathy. No axillary lymphadenopathy. Moderate sized hiatal hernia. Lungs/Pleura: Trachea and mainstem bronchi are patent. Lungs are clear without airspace disease or pulmonary edema. Small amount atelectasis or volume loss in left lower lobe. Negative for pneumothorax. Hepatobiliary: Mild dilatation of the common bile duct  measuring up to 1.1 cm but no significant change from the previous examination. Normal appearance of the liver, gallbladder portal venous system. Pancreas: Configuration of the pancreas is slightly unusual and may be related to the hiatal hernia. No evidence for acute inflammation or significant duct dilatation. Spleen: Normal appearance of spleen without enlargement. Adrenals/Urinary Tract: Again noted is a low-density left adrenal lesion. This is compatible with an adrenal adenoma on the previous noncontrast examination and measures up to 3.6 cm in size. Again noted is chronic atrophy of the left kidney. Normal appearance of the right kidney and right adrenal gland. Normal appearance of the urinary bladder. Stomach/Bowel: Moderate size hiatal hernia. Diverticulosis in the proximal sigmoid colon without acute inflammation. Diverticulosis in the descending colon. No evidence for bowel obstruction or acute bowel inflammation. Lymphatic: No significant lymph node enlargement in the abdomen or pelvis. Reproductive: Uterus has been removed. Normal appearance of the bilateral adnexal/ovarian tissue. Other: No ascites or free air. Musculoskeletal: Small amount of fluid in left knee joint. Small low-density or cystic structure along the inferior right calf musculature and probably an incidental finding. Degenerative endplate changes involving T10, T11 and T12. Disc space narrowing at C5-C6. IMPRESSION: VASCULAR Minimal atherosclerotic disease in the chest, abdomen, pelvis and lower extremities. No significant inflow disease or outflow disease. Three-vessel runoff in both lower extremities. There is only one area that is concerning for significant disease or stenosis. Cannot exclude a short-segment occlusion in the distal left anterior tibial artery near the origin of the dorsalis pedis artery. However, there appears to be reconstitution of the dorsalis pedis artery. Varicosities in both lower extremities, left side greater  than right. These varicosities appear to be associated with the great saphenous veins bilaterally. Findings compatible with superficial venous insufficiency and could be contributing to the patient's symptoms. NON-VASCULAR 5.3 cm low-density structure along the left side of the mediastinum. Based on a report from 2000, this is probably a chronic finding and morphology would be compatible with a benign etiology such as a pericardial cyst. Moderate sized hiatal hernia. Left adrenal adenoma. Chronic atrophy of the left kidney. Colonic diverticulosis. Multinodular goiter. This could be further characterized with ultrasound. Electronically Signed   By: Markus Daft M.D.   On: 12/24/2016 13:50   Ct Angio Chest Aorta W/cm &/or Wo/cm  Result Date: 12/24/2016 CLINICAL DATA:  Bilateral feet pain with color change. Normal ankle-brachial indices at rest. EXAM: CT ANGIOGRAPHY OF THE CHEST CT ANGIOGRAPHY OF ABDOMINAL AORTA WITH ILIOFEMORAL RUNOFF TECHNIQUE: Multidetector CT imaging of the chest, abdomen, pelvis and lower extremities was performed using the standard protocol during bolus administration of intravenous contrast. Multiplanar CT image reconstructions and MIPs were  obtained to evaluate the vascular anatomy. CONTRAST:  125 mL Isovue 370 COMPARISON:  CT 08/31/2009.  CT report from 01/17/1999 FINDINGS: VASCULAR Aorta: Motion artifact of the ascending thoracic aorta without evidence aneurysm or dissection. There appears to be a bovine type arch but there is a large amount of motion artifact at the great vessels area. Normal caliber of the abdominal aorta without aneurysm or dissection. The abdominal aorta is mildly tortuous. Pulmonary arteries:  Main pulmonary arteries are patent. Celiac: Separate origins of the splenic artery and common hepatic artery from the aorta. These branches are widely patent without atherosclerotic disease. Left gastric artery originates directly from the aorta. SMA: Patent without evidence of  aneurysm, dissection, vasculitis or significant stenosis. Renals: Left renal artery appears to be atrophic and associated with a chronically atrophic left kidney. There is a single right renal artery which is patent without atherosclerotic disease or stenosis. IMA: IMA is patent. RIGHT Lower Extremity Inflow: Minimal atherosclerotic disease in the right internal iliac artery. The right common and right external iliac artery are widely patent without significant atherosclerotic disease. Outflow: Right common femoral artery is patent. The right deep femoral arteries are patent. SFA is widely patent without atherosclerotic disease. Popliteal artery is widely patent without atherosclerotic disease. Runoff: Three-vessel runoff in the right calf. LEFT Lower Extremity Inflow: Minimal atherosclerotic disease in left common iliac artery without significant stenosis. Left internal iliac artery is patent. Left external iliac artery is patent without significant stenosis. Outflow: Minimal disease in left common femoral artery. Left profunda femoral arteries are patent. Left SFA is patent. Left popliteal artery is patent. Runoff: Large amount of venous contamination in left calf but there appears to be three-vessel runoff in the left calf. However, there may be a short-segment occlusion in the distal anterior tibial artery/proximal dorsalis pedis artery region. Veins: Venous varicosities along the bilateral medial thighs. These varicosities are associated with the great saphenous vein bilaterally. Varicosities are larger on the left than the right. Varicosities along the anterior and medial left calf. Left calf varicosities are associated with the great saphenous vein. Few varicosities along the medial right calf. Review of the MIP images confirms the above findings. NON-VASCULAR Mediastinum/Nodes: Thyroid tissue is heterogeneous with bilateral low-density nodules. There is a large low-density structure in the prevascular space  along the left anterior mediastinum. This structure is low density and measures 5.3 x 3.8 x 3.5 cm. Margins of this structure are well-defined except for the anterior aspect. Chest CT report from 01/17/1999 described a large left anterior mediastinal mass. These old comparison images are not available but suspect this is referring to the same structure. This probably represents a benign etiology such as a pericardial cyst. Otherwise, there is no significant chest lymphadenopathy. No axillary lymphadenopathy. Moderate sized hiatal hernia. Lungs/Pleura: Trachea and mainstem bronchi are patent. Lungs are clear without airspace disease or pulmonary edema. Small amount atelectasis or volume loss in left lower lobe. Negative for pneumothorax. Hepatobiliary: Mild dilatation of the common bile duct measuring up to 1.1 cm but no significant change from the previous examination. Normal appearance of the liver, gallbladder portal venous system. Pancreas: Configuration of the pancreas is slightly unusual and may be related to the hiatal hernia. No evidence for acute inflammation or significant duct dilatation. Spleen: Normal appearance of spleen without enlargement. Adrenals/Urinary Tract: Again noted is a low-density left adrenal lesion. This is compatible with an adrenal adenoma on the previous noncontrast examination and measures up to 3.6 cm in size. Again noted  is chronic atrophy of the left kidney. Normal appearance of the right kidney and right adrenal gland. Normal appearance of the urinary bladder. Stomach/Bowel: Moderate size hiatal hernia. Diverticulosis in the proximal sigmoid colon without acute inflammation. Diverticulosis in the descending colon. No evidence for bowel obstruction or acute bowel inflammation. Lymphatic: No significant lymph node enlargement in the abdomen or pelvis. Reproductive: Uterus has been removed. Normal appearance of the bilateral adnexal/ovarian tissue. Other: No ascites or free air.  Musculoskeletal: Small amount of fluid in left knee joint. Small low-density or cystic structure along the inferior right calf musculature and probably an incidental finding. Degenerative endplate changes involving T10, T11 and T12. Disc space narrowing at C5-C6. IMPRESSION: VASCULAR Minimal atherosclerotic disease in the chest, abdomen, pelvis and lower extremities. No significant inflow disease or outflow disease. Three-vessel runoff in both lower extremities. There is only one area that is concerning for significant disease or stenosis. Cannot exclude a short-segment occlusion in the distal left anterior tibial artery near the origin of the dorsalis pedis artery. However, there appears to be reconstitution of the dorsalis pedis artery. Varicosities in both lower extremities, left side greater than right. These varicosities appear to be associated with the great saphenous veins bilaterally. Findings compatible with superficial venous insufficiency and could be contributing to the patient's symptoms. NON-VASCULAR 5.3 cm low-density structure along the left side of the mediastinum. Based on a report from 2000, this is probably a chronic finding and morphology would be compatible with a benign etiology such as a pericardial cyst. Moderate sized hiatal hernia. Left adrenal adenoma. Chronic atrophy of the left kidney. Colonic diverticulosis. Multinodular goiter. This could be further characterized with ultrasound. Electronically Signed   By: Markus Daft M.D.   On: 12/24/2016 13:50    Procedures Procedures (including critical care time)  Medications Ordered in ED Medications  morphine 4 MG/ML injection 4 mg (4 mg Intravenous Given 12/24/16 0836)  morphine 4 MG/ML injection 4 mg (4 mg Intravenous Given 12/24/16 1107)  iopamidol (ISOVUE-370) 76 % injection (125 mLs Intravenous Contrast Given 12/24/16 1156)  morphine 4 MG/ML injection 4 mg (4 mg Intravenous Given 12/24/16 1411)  LORazepam (ATIVAN) tablet 1 mg (1 mg  Oral Given 12/24/16 1555)  morphine 4 MG/ML injection 4 mg (4 mg Intravenous Given 12/24/16 1627)     Initial Impression / Assessment and Plan / ED Course  I have reviewed the triage vital signs and the nursing notes.  Pertinent labs & imaging results that were available during my care of the patient were reviewed by me and considered in my medical decision making (see chart for details).     ERIONA KINCHEN is a 51 y.o. female with a past medical history significant for hypertension and gout who presents with bilateral foot pain.   History and exam are seen above. Next  On exam, patient has palpable pulses on the right DP and PT but on the left, were not palpable. Doppler was utilized and pulses were weaker but were found  Both the DP and PT patient had normal sensation and strength in her bilateral lower extremities. No calf tenderness, no knee tenderness, and no thigh tenderness. Patient had no abdominal tenderness and lungs were clear. Chest nontender. No focal neurologic deficits. Patient had decreased capillary refill in the left compared to right.  Given patient's concern for color change and severe pain in the setting of the increased capillary refill and pulses prescription and see, there is clinical concern for a vascular etiology  for pain. Patient denies history of PVD or claudication. Patient will have laboratory testing, ultrasound testing to look at the arteries, and will be given pain medication. Anticipate reassessment following workup.  Chest x-ray showed no edema or consolidation of the lungs. There was a tortuous thoracic aorta.   Diagnostic laboratory testing shows slight leukocytosis of 11.0. Other lab testing grossly unremarkable. ABI arterial ultrasound was reassuring and showed good flow. Given the consistent need concern for a vascular issue causing the bilateral leg pain, color change, and swelling, vascular surgery was called to recommend imitations. They recommended  obtaining a CT of the aorta with runoff into the lower extremity. This was obtained.  CT imaging showed a mediastinum low-density structure that is likely chronic based on prior report.  After imaging, vascular surgery saw the patient and do not feel she has an acute arterial cause of her bilateral lower extremity pain.   Given the report of lower extremity edema, and her pain, bilateral DVT ultrasounds will be ordered. Patient given more pain medication with improvement in symptoms.  Long discussion held with the patient while awaiting her DVT study. Given the bilateral nature and location of the pain, do not feel patient has a gout flare. Patient says it does not feel like her gout. Patient has no history of neuropathies however, this is considered for cause of pain. No traumatic injuries and no fractures or cellulitis were seen on the CT scans.  If DVT study is negative and reassuring, suspect patient will be able to be discharged with pain medication and anxiety medication. Would consider Ativan PO given how anxious she is with her pain.   DVT study negative for dvt.   Patient requests information to follow up with a neurologist for possible neuropathic pain. This number was provided. Patient given pain medication and anxiety medication. Patient understood return precautions as well as PCP follow up instructions.  Patient had no other questions or concerns and was discharged in good condition.   Final Clinical Impressions(s) / ED Diagnoses   Final diagnoses:  Bilateral foot pain  Agitation    New Prescriptions Discharge Medication List as of 12/24/2016  5:10 PM      Clinical Impression: 1. Bilateral foot pain   2. Pain   3. Agitation     Disposition: Discharge  Condition: Good  I have discussed the results, Dx and Tx plan with the pt(& family if present). He/she/they expressed understanding and agree(s) with the plan. Discharge instructions discussed at great length.  Strict return precautions discussed and pt &/or family have verbalized understanding of the instructions. No further questions at time of discharge.    Discharge Medication List as of 12/24/2016  5:10 PM      Follow Up: [place] [id number]  If symptoms worsen  Boyd Kerbs, MD [address], [place] [address] [phone]  Schedule an appointment as soon as possible for a visit  Please follow up for further management of your leg pains.  Way[address]ta [place], [place] [address] [phone]  Schedule an appointment as soon as possible for a visit        Courtney Paris, MD 12/25/16 1058

## 2016-12-24 NOTE — Progress Notes (Signed)
**  Preliminary report by tech**  Bilateral lower extremity venous duplex complete. There is no evidence of deep or superficial vein thrombosis involving the right and left lower extremities. All visualized vessels appear patent and compressible. Prominent varicose veins were noted in the left mid thigh, but appear to be patent. Incidental findings of an anechoic structure in the left popliteal fossa measuring 1.9 cm by 4.3 cm wide by 4.9 cm long. There is no evidence of a Baker's cyst on the right. Results were given to Dr. Sherry Ruffing.  12/24/16 3:46 PM Carlos Levering RVT

## 2016-12-24 NOTE — Consult Note (Signed)
Hospital Consult    Reason for Consult:  Bilateral foot pain  Referring Physician:  [facility] ED) MRN #:  [id number]  History of Present Illness: This is a 51 y.o. female here with 4 days of bilateral foot pain. Denies associated injury. Has never had similar issue. No constitutional symptoms. Pain is severe and constant and non-radiating. No alleviating factors although maybe morphine has helped. Associated subjective edema and skin color changes with both feet turning purple.   Past Medical History:  Diagnosis Date  . Gout   . Hypertension     History reviewed. No pertinent surgical history.  Allergies  Allergen Reactions  . Prednisone Other (See Comments)    Pt reports hyperactivity, "makes me crazy"    Prior to Admission medications   Medication Sig Start Date End Date Taking? Authorizing Provider  ADDERALL XR 30 MG 24 hr capsule Take 30 mg by mouth daily. 12/01/16  Yes Historical Provider, MD  allopurinol (ZYLOPRIM) 300 MG tablet Take 300 mg by mouth daily.   Yes Historical Provider, MD  atenolol (TENORMIN) 50 MG tablet Take 50 mg by mouth daily.   Yes Historical Provider, MD  buPROPion (WELLBUTRIN SR) 150 MG 12 hr tablet Take 150 mg by mouth 2 (two) times daily.   Yes Historical Provider, MD  gabapentin (NEURONTIN) 300 MG capsule Take 300 mg by mouth 3 (three) times daily. 11/28/16  Yes Historical Provider, MD  lisinopril (PRINIVIL,ZESTRIL) 20 MG tablet Take 20 mg by mouth daily.   Yes Historical Provider, MD  sertraline (ZOLOFT) 100 MG tablet Take 100 mg by mouth daily.   Yes Historical Provider, MD  traMADol (ULTRAM) 50 MG tablet Take 100 mg by mouth 3 (three) times daily. 12/12/16  Yes Historical Provider, MD  atenolol (TENORMIN) 25 MG tablet Take 25 mg by mouth daily.    Historical Provider, MD  dicyclomine (BENTYL) 20 MG tablet Take 1 tablet (20 mg total) by mouth 2 (two) times daily. Patient not taking: Reported on 12/24/2016 10/18/15   Helane Gunther Joy, PA-C  predniSONE  (DELTASONE) 20 MG tablet Take 1 tablet (20 mg total) by mouth 2 (two) times daily with a meal. Patient not taking: Reported on 12/24/2016 10/18/15   Shawn C Joy, PA-C  promethazine (PHENERGAN) 25 MG tablet Take 1 tablet (25 mg total) by mouth every 6 (six) hours as needed for nausea or vomiting. Patient not taking: Reported on 12/24/2016 10/18/15   Lorayne Bender, PA-C    Social History   Social History  . Marital status: Widowed    Spouse name: N/A  . Number of children: N/A  . Years of education: N/A   Occupational History  . Not on file.   Social History Main Topics  . Smoking status: Never Smoker  . Smokeless tobacco: Never Used  . Alcohol use No  . Drug use: No  . Sexual activity: Not on file   Other Topics Concern  . Not on file   Social History Narrative  . No narrative on file     History reviewed. No pertinent family history.  ROS: [x]  Positive   [ ]  Negative   [ ]  All sytems reviewed and are negative  Cardiovascular: []  chest pain/pressure []  palpitations []  SOB lying flat []  DOE []  pain in legs while walking [x]  pain in legs at rest []  pain in legs at night []  non-healing ulcers []  hx of DVT [x]  swelling in legs  Pulmonary: []  productive cough []  asthma/wheezing []  home O2  Neurologic: []  weakness in []  arms []  legs []  numbness in []  arms []  legs []  hx of CVA []  mini stroke [] difficulty speaking or slurred speech []  temporary loss of vision in one eye []  dizziness  Hematologic: []  hx of cancer []  bleeding problems []  problems with blood clotting easily  Endocrine:   []  diabetes []  thyroid disease  GI []  vomiting blood []  blood in stool  GU: []  CKD/renal failure []  HD--[]  M/W/F or []  T/T/S []  burning with urination []  blood in urine  Psychiatric: []  anxiety []  depression  Musculoskeletal: []  arthritis []  joint pain  Integumentary: []  rashes []  ulcers  Constitutional: []  fever []  chills   Physical Examination  Vitals:    12/24/16 1112 12/24/16 1414  BP: (!) 136/91 126/78  Pulse: 75 81  Resp: 18 18  Temp:     There is no height or weight on file to calculate BMI.  General:  WDWN in NAD Gait: Not observed HENT: WNL, normocephalic Pulmonary: normal non-labored breathing, without Rales, rhonchi,  wheezing Cardiac: 2+ palpable radial, femoral, popliteal, dp and right pt. Pain limited left pt exam.mulitphasic signals at bilateral dp/pt Abdomen: soft, NT/ND, no masses Skin: no skin changes Extremities: no overt signs of ischemia or injury, varicose veins bilaterally with large cluster at left medial thigh. Capillary refill 2s bilateral great toes Musculoskeletal: no muscle wasting or atrophy  Neurologic: extremely sensitive to palpation in her feet, motor is in tact bilateral upper and lower extremities Psychiatric:  Appropriate mood and affect  CBC    Component Value Date/Time   WBC 11.0 (H) 12/24/2016 0824   RBC 4.37 12/24/2016 0824   HGB 14.0 12/24/2016 0824   HCT 42.1 12/24/2016 0824   PLT 265 12/24/2016 0824   MCV 96.3 12/24/2016 0824   MCH 32.0 12/24/2016 0824   MCHC 33.3 12/24/2016 0824   RDW 13.2 12/24/2016 0824   LYMPHSABS 1.9 12/24/2016 0824   MONOABS 1.0 12/24/2016 0824   EOSABS 0.3 12/24/2016 0824   BASOSABS 0.0 12/24/2016 0824    BMET    Component Value Date/Time   NA 138 12/24/2016 0824   K 4.7 12/24/2016 0824   CL 104 12/24/2016 0824   CO2 27 12/24/2016 0824   GLUCOSE 110 (H) 12/24/2016 0824   BUN 21 (H) 12/24/2016 0824   CREATININE 0.93 12/24/2016 0824   CALCIUM 9.4 12/24/2016 0824   GFRNONAA >60 12/24/2016 0824   GFRAA >60 12/24/2016 0824    COAGS: Lab Results  Component Value Date   INR 0.91 12/24/2016     Non-Invasive Vascular Imaging:   Arterial pressure indices:  +-----------------+---------+--------------+---------+ !Location         !Pressure !Brachial index!Waveform ! +-----------------+---------+--------------+---------+ !Right post tibial!189 mm  Hg!1.1           !Triphasic! +-----------------+---------+--------------+---------+ !Right dorsal ped !180 mm Hg!1.05          !Triphasic! +-----------------+---------+--------------+---------+ !Left post tibial !188 mm Hg!1.09          !Biphasic ! +-----------------+---------+--------------+---------+ !Left dorsal ped  !179 mm Hg!1.04          !Biphasic ! +-----------------+---------+--------------+---------+  ------------------------------------------------------------------- Summary: Right ABI of 1.1 and left ABI of 1.09 are suggestive of arterial flow within normal limits at rest.   IMPRESSION: VASCULAR  Minimal atherosclerotic disease in the chest, abdomen, pelvis and lower extremities. No significant inflow disease or outflow disease. Three-vessel runoff in both lower extremities. There is only one area that is concerning for significant disease or stenosis. Cannot exclude  a short-segment occlusion in the distal left anterior tibial artery near the origin of the dorsalis pedis artery. However, there appears to be reconstitution of the dorsalis pedis artery.  Varicosities in both lower extremities, left side greater than right. These varicosities appear to be associated with the great saphenous veins bilaterally. Findings compatible with superficial venous insufficiency and could be contributing to the patient's symptoms.   ASSESSMENT/PLAN: This is a 51 y.o. female here with 4 days of bilateral foot pain and discoloration. On my exam there is good capillary refill and no discoloration. Her exam is limited in that she limits evaluation of her feet but I can feel her bilateral dp arteries and at least right pt. There is no significant vascular disease by abi or CT angio. No vascular intervention at this time and really no vascular explanation for her pain. She can f/u on a prn basis for varicose vein treatment when the more acute issues has resolved.  Kortnee Bas C. Donzetta Matters,  MD [facility] Office: [phone] Pager: [phone]

## 2016-12-24 NOTE — Discharge Instructions (Signed)
Please take your pain medicine and anxiety medicine to help with your symptoms. Please schedule a follow-up appointment with a primary care physician as well as your neurologist for further management. You may also schedule a follow-up appointment with Dr. Donzetta Matters, the vascular specialist. If any symptoms acutely worsen, please return to the nearest emergency department.

## 2016-12-24 NOTE — ED Triage Notes (Signed)
Pt states she has been on her feet a lot this week.  2 days ago starting having pain in bilateral feet with color change

## 2016-12-24 NOTE — Progress Notes (Signed)
VASCULAR LAB PRELIMINARY  ARTERIAL  ABI completed: Right ABI of 1.1 and left ABI of 1.09 are suggestive of arterial flow within normal limits at rest.   RIGHT    LEFT    PRESSURE WAVEFORM  PRESSURE WAVEFORM  BRACHIAL 172 Triphasic BRACHIAL 166 Triphasic  DP 180 Triphasic DP 179 Biphasic  PT 189 Triphasic PT 188 Biphasic    RIGHT LEFT  ABI 1.1 1.09     Legrand Como, RVT 12/24/2016, 10:10 AM

## 2016-12-25 ENCOUNTER — Emergency Department (HOSPITAL_COMMUNITY)
Admission: EM | Admit: 2016-12-25 | Discharge: 2016-12-25 | Disposition: A | Discharge status: Home / Self Care | Payer: Medicaid Other | Attending: Dermatology | Admitting: Dermatology

## 2016-12-25 ENCOUNTER — Encounter (HOSPITAL_COMMUNITY): Payer: Self-pay | Admitting: *Deleted

## 2016-12-25 DIAGNOSIS — Z79899 Other long term (current) drug therapy: Secondary | ICD-10-CM | POA: Diagnosis not present

## 2016-12-25 DIAGNOSIS — M79671 Pain in right foot: Secondary | ICD-10-CM | POA: Diagnosis not present

## 2016-12-25 DIAGNOSIS — I10 Essential (primary) hypertension: Secondary | ICD-10-CM | POA: Insufficient documentation

## 2016-12-25 DIAGNOSIS — Z5321 Procedure and treatment not carried out due to patient leaving prior to being seen by health care provider: Secondary | ICD-10-CM | POA: Insufficient documentation

## 2016-12-25 DIAGNOSIS — M79672 Pain in left foot: Secondary | ICD-10-CM | POA: Diagnosis not present

## 2016-12-25 NOTE — ED Triage Notes (Signed)
Pt states seen at [facility] ED yesterday for bil foot pain, swelling the turning purple/numb.  States they performed all kinds of tests and finally referred her to a neurologist.  She called neurologist and they stated they can't see her unless the referral is from her primary.  They suggested she come here to see a neurologist.  Pt states cannot walk d/t pain.

## 2016-12-25 NOTE — ED Notes (Signed)
Called pt to obtain vitals, pt did not answer x3.

## 2016-12-25 NOTE — ED Notes (Signed)
Called pt name three times. No response.

## 2017-01-27 ENCOUNTER — Emergency Department (HOSPITAL_COMMUNITY): Payer: Medicaid Other

## 2017-01-27 ENCOUNTER — Emergency Department (HOSPITAL_COMMUNITY)
Admission: EM | Admit: 2017-01-27 | Discharge: 2017-01-27 | Disposition: A | Discharge status: Home / Self Care | Payer: Medicaid Other | Attending: Emergency Medicine | Admitting: Emergency Medicine

## 2017-01-27 ENCOUNTER — Encounter (HOSPITAL_COMMUNITY): Payer: Self-pay | Admitting: Emergency Medicine

## 2017-01-27 DIAGNOSIS — M79604 Pain in right leg: Secondary | ICD-10-CM | POA: Insufficient documentation

## 2017-01-27 DIAGNOSIS — I10 Essential (primary) hypertension: Secondary | ICD-10-CM | POA: Insufficient documentation

## 2017-01-27 DIAGNOSIS — Z79899 Other long term (current) drug therapy: Secondary | ICD-10-CM | POA: Diagnosis not present

## 2017-01-27 MED ORDER — HYDROCODONE-ACETAMINOPHEN 5-325 MG PO TABS: 1.0000 | ORAL_TABLET | ORAL | 0 refills | Status: DC | PRN

## 2017-01-27 MED ORDER — MELOXICAM 7.5 MG PO TABS: 7.5000 mg | ORAL_TABLET | Freq: Every day | ORAL | 0 refills | Status: DC | PRN

## 2017-01-27 MED ORDER — OXYCODONE-ACETAMINOPHEN 5-325 MG PO TABS: 1.0000 | ORAL_TABLET | ORAL | 0 refills | Status: DC | PRN

## 2017-01-27 MED ORDER — OXYCODONE-ACETAMINOPHEN 5-325 MG PO TABS
1.0000 | ORAL_TABLET | Freq: Once | ORAL | Status: AC
  Administered 2017-01-27: 1 via ORAL
  Filled 2017-01-27: qty 1

## 2017-01-27 NOTE — ED Provider Notes (Signed)
[facility] DEPT Provider Note   CSN: [id number] Arrival date & time: 01/27/17  0443     History   Chief Complaint Chief Complaint  Patient presents with  . Leg Pain    HPI Amanda Cardenas is a 51 y.o. female.  HPI   Pt with hx gout, HTN, recent ED visit for bilateral lower extremity pain and discoloration, presents today with 3 days of right leg pain, primarily in her knee.  The pain began in the "back of the knee."  It is aching and shooting, worse towards the ends of the day, exacerbated by palpation and ambulation.  Denies any falls or injury.  Has had pain for the past 6 months that has gotten worse.  Denies fevers, abdominal pain, back pain, bowel or bladder dysfunction.    At last visit pt had bilateral lower extremity pain and discoloration of the left foot.  Negative DVT studies bilaterally, normal ABIs, negative CT aorta with runoff, consult by vascular surgery - thought to be nonvascular, referred to neurology.  She has not yet followed up with neurology.  States since then her left leg is completely back to normal and asymptomatic, and her right leg was too until 3 days ago.    Past Medical History:  Diagnosis Date  . Gout   . Hypertension     There are no active problems to display for this patient.   History reviewed. No pertinent surgical history.  OB History    No data available       Home Medications    Prior to Admission medications   Medication Sig Start Date End Date Taking? Authorizing Provider  allopurinol (ZYLOPRIM) 300 MG tablet Take 300 mg by mouth every morning.    Yes Historical Provider, MD  amphetamine-dextroamphetamine (ADDERALL) 30 MG tablet Take 30 mg by mouth 2 (two) times daily.   Yes Historical Provider, MD  atenolol (TENORMIN) 50 MG tablet Take 50 mg by mouth every morning.    Yes Historical Provider, MD  buPROPion (WELLBUTRIN SR) 150 MG 12 hr tablet Take 150 mg by mouth 2 (two) times daily.   Yes Historical Provider, MD  furosemide  (LASIX) 20 MG tablet Take 20 mg by mouth every morning. 12/25/16  Yes Historical Provider, MD  gabapentin (NEURONTIN) 300 MG capsule Take 300 mg by mouth 3 (three) times daily. 11/28/16  Yes Historical Provider, MD  lisinopril (PRINIVIL,ZESTRIL) 20 MG tablet Take 20 mg by mouth every morning.    Yes Historical Provider, MD  sertraline (ZOLOFT) 100 MG tablet Take 100 mg by mouth every morning.    Yes Historical Provider, MD  HYDROcodone-acetaminophen (NORCO/VICODIN) 5-325 MG tablet Take 1-2 tablets by mouth every 4 (four) hours as needed for moderate pain or severe pain. 01/27/17   Clayton Bibles, PA-C  meloxicam (MOBIC) 7.5 MG tablet Take 1-2 tablets (7.5-15 mg total) by mouth daily as needed for pain. 01/27/17   Clayton Bibles, PA-C  predniSONE (DELTASONE) 20 MG tablet Take 1 tablet (20 mg total) by mouth 2 (two) times daily with a meal. Patient not taking: Reported on 12/24/2016 10/18/15   Shawn C Joy, PA-C  promethazine (PHENERGAN) 25 MG tablet Take 1 tablet (25 mg total) by mouth every 6 (six) hours as needed for nausea or vomiting. Patient not taking: Reported on 12/24/2016 10/18/15   Lorayne Bender, PA-C    Family History History reviewed. No pertinent family history.  Social History Social History  Substance Use Topics  . Smoking status: Never  Smoker  . Smokeless tobacco: Never Used  . Alcohol use No     Allergies   Prednisone   Review of Systems Review of Systems  All other systems reviewed and are negative.    Physical Exam Updated Vital Signs BP (!) 148/101 (BP Location: Right Arm)   Pulse 74   Temp 97.7 F (36.5 C) (Oral)   Resp 18   LMP 10/18/2011   SpO2 99%   Physical Exam  Constitutional: She appears well-developed and well-nourished. No distress.  HENT:  Head: Normocephalic and atraumatic.  Neck: Neck supple.  Pulmonary/Chest: Effort normal.  Abdominal: Soft. She exhibits no distension and no mass. There is no tenderness. There is no guarding.  Musculoskeletal:  Spine  nontender, no crepitus, or stepoffs.   Pt yells in pain with movement of her legs.  Distal pulses - PT and DP are palpable bilaterally.  No discoloration of the skin.  Right knee without erythema, edema, warmth, laxity or pain with stress in any direction.  Legs are symmetric.  No calf edema or tenderness.    Neurological: She is alert.  Skin: She is not diaphoretic.  Nursing note and vitals reviewed.    ED Treatments / Results  Labs (all labs ordered are listed, but only abnormal results are displayed) Labs Reviewed - No data to display  EKG  EKG Interpretation None       Radiology Dg Knee Complete 4 Views Right  Result Date: 01/27/2017 CLINICAL DATA:  Diffuse RIGHT knee pain, worsening tonight. Seen previously for similar symptoms. EXAM: RIGHT KNEE - COMPLETE 4+ VIEW COMPARISON:  None. FINDINGS: No evidence of fracture, dislocation, or joint effusion. No destructive bony lesions. Quadriceps insertional enthesopathy. Mild tricompartmental marginal spurring. Medial knee varicosities. 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 Electronically Signed   By: Elon Alas M.D.   On: 01/27/2017 05:52    Procedures Procedures (including critical care time)  Medications Ordered in ED Medications  oxyCODONE-acetaminophen (PERCOCET/ROXICET) 5-325 MG per tablet 1 tablet (1 tablet Oral Given 01/27/17 1478)     Initial Impression / Assessment and Plan / ED Course  I have reviewed the triage vital signs and the nursing notes.  Pertinent labs & imaging results that were available during my care of the patient were reviewed by me and considered in my medical decision making (see chart for details).  Clinical Course as of Jan 28 707  Sat Jan 27, 2017  0706 Pt complained at discharge about the pain medication prescription that I gave her.  We discussed this at length.  I have advised her that the ED is not the appropriate place to come for pain  management for chronic pain.  I also noted that narcotics are not the best treatment for her.  She will be given #8 percocet.  I have checked her records on the [facility].  She, at discharge, tells me that she went to her PCP yesterday and had xrays of her hips and the PCP declined to give her stronger pain medication, gave her a shot of toradol.  Pt advised that she should contact her PCP and this person is the appropriate person to manage to her pain.  Pt verbalizes understanding.    [EW]    Clinical Course User Index [EW] Clayton Bibles, PA-C    Afebrile, nontoxic patient with right leg, and specifically right knee pain that began 3 days ago without injury.  Neurovascularly intact.  Recent imaging demonstrated lower thoracic degenerative changes, negative  DVT studies, negative arterial studies.  Pt was referred to neurology, has not followed up.  Knee xray demonstrates arthritis.  Clinically doubt DVT, cellulitis, gout, septic joint, cauda equina.   D/C home with #8 percocet, orthopedic, neuro, PCP follow up.  Discussed result, findings, treatment, and follow up  with patient.  Pt given return precautions.  Pt verbalizes understanding and agrees with plan.       Final Clinical Impressions(s) / ED Diagnoses   Final diagnoses:  Right leg pain    New Prescriptions New Prescriptions   HYDROCODONE-ACETAMINOPHEN (NORCO/VICODIN) 5-325 MG TABLET    Take 1-2 tablets by mouth every 4 (four) hours as needed for moderate pain or severe pain.   MELOXICAM (MOBIC) 7.5 MG TABLET    Take 1-2 tablets (7.5-15 mg total) by mouth daily as needed for pain.     Clayton Bibles, PA-C 01/27/17 8599    Orpah Greek, MD 01/27/17 [phone]

## 2017-01-27 NOTE — Discharge Instructions (Signed)

## 2017-01-27 NOTE — ED Triage Notes (Signed)
Pt is c/o right leg pain  Pt states this has been an ongoing issue and has been seen before for same  Pt states her right knee is "killing me"

## 2017-02-08 DIAGNOSIS — R9389 Abnormal findings on diagnostic imaging of other specified body structures: Secondary | ICD-10-CM | POA: Insufficient documentation

## 2017-02-08 HISTORY — DX: Abnormal findings on diagnostic imaging of other specified body structures: R93.89

## 2017-03-16 ENCOUNTER — Encounter: Payer: Self-pay | Admitting: Vascular Surgery

## 2017-03-22 ENCOUNTER — Other Ambulatory Visit: Payer: Self-pay

## 2017-03-22 DIAGNOSIS — I83813 Varicose veins of bilateral lower extremities with pain: Secondary | ICD-10-CM

## 2017-03-28 ENCOUNTER — Ambulatory Visit (HOSPITAL_COMMUNITY): Admission: RE | Admit: 2017-03-28 | Payer: Medicaid Other | Source: Ambulatory Visit

## 2017-03-28 ENCOUNTER — Encounter: Payer: Medicaid Other | Admitting: Vascular Surgery

## 2017-05-08 ENCOUNTER — Encounter: Payer: Self-pay | Admitting: Vascular Surgery

## 2017-05-16 ENCOUNTER — Ambulatory Visit (INDEPENDENT_AMBULATORY_CARE_PROVIDER_SITE_OTHER): Payer: Medicaid Other | Admitting: Vascular Surgery

## 2017-05-16 ENCOUNTER — Encounter: Payer: Self-pay | Admitting: Vascular Surgery

## 2017-05-16 ENCOUNTER — Ambulatory Visit (HOSPITAL_COMMUNITY)
Admission: RE | Admit: 2017-05-16 | Discharge: 2017-05-16 | Disposition: A | Discharge status: Home / Self Care | Payer: Medicaid Other | Source: Ambulatory Visit | Attending: Vascular Surgery | Admitting: Vascular Surgery

## 2017-05-16 VITALS — BP 184/113 | HR 70 | Temp 97.8°F | Resp 16 | Ht 63.0 in | Wt 173.0 lb

## 2017-05-16 DIAGNOSIS — I872 Venous insufficiency (chronic) (peripheral): Secondary | ICD-10-CM | POA: Insufficient documentation

## 2017-05-16 DIAGNOSIS — I83893 Varicose veins of bilateral lower extremities with other complications: Secondary | ICD-10-CM

## 2017-05-16 DIAGNOSIS — I83813 Varicose veins of bilateral lower extremities with pain: Secondary | ICD-10-CM | POA: Diagnosis not present

## 2017-05-16 DIAGNOSIS — I83892 Varicose veins of left lower extremities with other complications: Secondary | ICD-10-CM | POA: Insufficient documentation

## 2017-05-16 NOTE — Progress Notes (Signed)
Requested by:  Dr. Harrell Gave Tegeler [facility] ED)  Reason for consultation: Left leg pain and cyanosis    History of Present Illness   Amanda Cardenas is a 51 y.o. (01-06-1966) female who presents with chief complaint: blue left foot.  This patient was evaluated by Dr. Donzetta Matters on 12/24/16 for reported cyanotic left foot.  On his exam, he felt her arterial perfusion was normal and she most likely had venous disease.  The patient returns today for evaluation of her left leg venous disease.  This patient has longstanding history of left varicosities and leg swelling.  The patient's symptoms include: progressive swelling and pain in left leg.  She denies any bleeding complications or pruritus.  The patient has had mp history of DVT, mp history of pregnancy, lmpwm history of varicose vein, mp history of venous stasis ulcers, mp history of  Lymphedema and mp history of skin changes in lower legs.  There is a family history of venous disorders.  The patient has intermittently used compression stockings in the past.  Past Medical History:  Diagnosis Date  . Arthritis   . Baker cyst   . Gout   . Hypertension     Past Surgical History:  Procedure Laterality Date  . HIATAL HERNIA REPAIR  2000    Social History   Social History  . Marital status: Widowed    Spouse name: N/A  . Number of children: 4  . Years of education: N/A   Occupational History  . unemployed    Social History Main Topics  . Smoking status: Current Some Day Smoker    Packs/day: 0.25    Years: 25.00    Types: Cigarettes  . Smokeless tobacco: Never Used  . Alcohol use No  . Drug use: No  . Sexual activity: Not on file   Other Topics Concern  . Not on file   Social History Narrative  . No narrative on file   Family History  Problem Relation Age of Onset  . Family history unknown: Yes    Current Outpatient Prescriptions  Medication Sig Dispense Refill  . allopurinol (ZYLOPRIM) 300 MG tablet Take 300 mg by  mouth every morning.     Marland Kitchen amphetamine-dextroamphetamine (ADDERALL) 15 MG tablet Take 1 tablet by mouth 2 (two) times daily.  0  . atenolol (TENORMIN) 50 MG tablet Take 50 mg by mouth every morning.     Marland Kitchen buPROPion (WELLBUTRIN SR) 150 MG 12 hr tablet Take 150 mg by mouth 2 (two) times daily.    . furosemide (LASIX) 20 MG tablet Take 20 mg by mouth daily.    Marland Kitchen gabapentin (NEURONTIN) 400 MG capsule TAKE 1 CAPSULE BY MOUTH THREE TIMES A DAY  0  . lisinopril (PRINIVIL,ZESTRIL) 20 MG tablet Take 20 mg by mouth every morning.     Marland Kitchen oxyCODONE-acetaminophen (PERCOCET/ROXICET) 5-325 MG tablet Take 1-2 tablets by mouth every 4 (four) hours as needed for severe pain. 8 tablet 0  . RESTASIS MULTIDOSE 0.05 % ophthalmic emulsion PLACE 1 DROP IN EACH EYE 2 TIMES A DAY  3  . sertraline (ZOLOFT) 100 MG tablet Take 100 mg by mouth every morning.      No current facility-administered medications for this visit.     Allergies  Allergen Reactions  . Prednisone Other (See Comments)    Pt reports hyperactivity, "makes me crazy"    REVIEW OF SYSTEMS (negative unless checked):   Cardiac:  []  Chest pain or chest pressure? []  Shortness  of breath upon activity? []  Shortness of breath when lying flat? []  Irregular heart rhythm?  Vascular:  [x]  Pain in calf, thigh, or hip brought on by walking? [x]  Pain in feet at night that wakes you up from your sleep? []  Blood clot in your veins? [x]  Leg swelling?  Pulmonary:  []  Oxygen at home? []  Productive cough? []  Wheezing?  Neurologic:  []  Sudden weakness in arms or legs? [x]  Sudden numbness in arms or legs? []  Sudden onset of difficult speaking or slurred speech? []  Temporary loss of vision in one eye? []  Problems with dizziness?  Gastrointestinal:  []  Blood in stool? []  Vomited blood?  Genitourinary:  []  Burning when urinating? [x]  Blood in urine?  Psychiatric:  []  Major depression  Hematologic:  []  Bleeding problems? []  Problems with blood  clotting?  Dermatologic:  [x]  Rashes or ulcers?  Constitutional:  []  Fever or chills?  Ear/Nose/Throat:  []  Change in hearing? []  Nose bleeds? []  Sore throat?  Musculoskeletal:  []  Back pain? []  Joint pain? []  Muscle pain?   Physical Examination     Vitals:   05/16/17 1121  BP: (!) 184/113  Pulse: 70  Resp: 16  Temp: 97.8 F (36.6 C)  TempSrc: Oral  SpO2: 96%  Weight: 173 lb (78.5 kg)  Height: 5\' 3"  (1.6 m)   Body mass index is 30.65 kg/m.  General Alert, O x 3, WD, NAD  Head /AT,    Ear/Nose/ Throat Hearing grossly intact, nares without erythema or drainage, oropharynx without Erythema or Exudate, Mallampati score: 3,   Eyes PERRLA, EOMI,    Neck Supple, mid-line trachea,    Pulmonary Sym exp, good B air movt, CTA B  Cardiac RRR, Nl S1, S2, no Murmurs, No rubs, No S3,S4  Vascular Vessel Right Left  Radial Palpable Palpable  Brachial Palpable Palpable  Carotid Palpable, No Bruit Palpable, No Bruit  Aorta Not palpable N/A  Femoral Palpable Palpable  Popliteal Not palpable Not palpable  PT Palpable Palpable  DP Palpable Palpable    Gastro- intestinal soft, non-distended, non-tender to palpation, No guarding or rebound, no HSM, no masses, no CVAT B, No palpable prominent aortic pulse,    Musculo- skeletal M/S 5/5 throughout  , Extremities without ischemic changes  , Non-pitting edema present: R 1+, L 1-2+, Varicosities present: R: small varicosities with spider vein, L: large nest of veins medially routing posteriorly, Lipodermatosclerosis present: mild amount in L calf  Neurologic Cranial nerves 2-12 intact , Pain and light touch intact in extremities , Motor exam as listed above  Psychiatric Judgement intact, Mood & affect appropriate for pt's clinical situation  Dermatologic See M/S exam for extremity exam, No rashes otherwise noted  Lymphatic  Palpable lymph nodes: None    Non-invasive Vascular Imaging   BLE Venous Insufficiency Duplex  (05/16/2017):   RLE:   no DVT and SVT,   + GSV reflux:2.9-4.8 mm  no SSV reflux,  + deep venous reflux: CFV  LLE:  No  DVT and SVT,   + GSV reflux: 3.1-9.3 mm  + SSV reflux: 1.9-3.3 mm  + deep venous reflux: CFV   Outside Studies/Documentation   2 pages of outside documents were reviewed including: ED record.   Medical Decision Making   Amanda Cardenas is a 51 y.o. female who presents with: BLE chronic venous insufficiency (R C2, L C4), varicose veins with complications   Based on the patient's history and examination, I recommend: compressive therapy.  I discussed with the patient  the use of her 20-30 mm thigh high compression stockings and need for 3 month trial of such.  The patient will follow up in 3 months with my partners in the [facility] for evaluation for: EVLA L GSV +/- stab phlebectomy.  Thank you for allowing [place] to participate in this patient's care.   Adele Barthel, MD, FACS [facility] Office: [phone] Pager: [phone]  05/16/2017, 11:58 AM

## 2017-08-20 ENCOUNTER — Ambulatory Visit (INDEPENDENT_AMBULATORY_CARE_PROVIDER_SITE_OTHER): Payer: Medicaid Other | Admitting: Vascular Surgery

## 2017-08-20 ENCOUNTER — Other Ambulatory Visit: Payer: Self-pay

## 2017-08-20 VITALS — BP 182/131 | HR 84 | Temp 97.8°F | Resp 16 | Ht 62.0 in | Wt 170.0 lb

## 2017-08-20 DIAGNOSIS — I83892 Varicose veins of left lower extremities with other complications: Secondary | ICD-10-CM | POA: Diagnosis not present

## 2017-08-20 NOTE — Progress Notes (Signed)
Vitals:   08/20/17 1436  BP: (!) 194/134  Pulse: 84  Resp: 16  Temp: 97.8 F (36.6 C)  TempSrc: Oral  SpO2: 99%  Weight: 170 lb (77.1 kg)  Height: 5\' 2"  (1.575 m)

## 2017-08-20 NOTE — Progress Notes (Signed)
Subjective:     Patient ID: Amanda Cardenas, female   DOB: 1965/10/17, 51 y.o.   MRN: [id number]  HPI This 51 year old female returns for 3 month follow-up regarding her painful varicosities and swelling in the left leg. She was evaluated by Dr. Bridgett Larsson 3 months ago and found to have gross reflux throughout left great saphenous vein which is grossly enlarged. She is tried long-leg elastic compression stockings 20-30 millimeter gradient as well as elevation and ibuprofen but continues to have aching discomfort which is affecting her daily living and she would like treatment.  Past Medical History:  Diagnosis Date  . Arthritis   . Baker cyst   . Gout   . Hypertension     Social History   Tobacco Use  . Smoking status: Current Some Day Smoker    Packs/day: 0.25    Years: 25.00    Pack years: 6.25    Types: Cigarettes  . Smokeless tobacco: Never Used  Substance Use Topics  . Alcohol use: No    Family History  Family history unknown: Yes    Allergies  Allergen Reactions  . Prednisone Other (See Comments)    Pt reports hyperactivity, "makes me crazy"     Current Outpatient Medications:  .  allopurinol (ZYLOPRIM) 300 MG tablet, Take 300 mg by mouth every morning. , Disp: , Rfl:  .  amphetamine-dextroamphetamine (ADDERALL) 15 MG tablet, Take 1 tablet by mouth 2 (two) times daily., Disp: , Rfl: 0 .  atenolol (TENORMIN) 50 MG tablet, Take 50 mg by mouth every morning. , Disp: , Rfl:  .  buPROPion (WELLBUTRIN SR) 150 MG 12 hr tablet, Take 150 mg by mouth 2 (two) times daily., Disp: , Rfl:  .  furosemide (LASIX) 20 MG tablet, Take 20 mg by mouth daily., Disp: , Rfl:  .  gabapentin (NEURONTIN) 400 MG capsule, TAKE 1 CAPSULE BY MOUTH THREE TIMES A DAY, Disp: , Rfl: 0 .  lisinopril (PRINIVIL,ZESTRIL) 20 MG tablet, Take 20 mg by mouth every morning. , Disp: , Rfl:  .  oxyCODONE-acetaminophen (PERCOCET/ROXICET) 5-325 MG tablet, Take 1-2 tablets by mouth every 4 (four) hours as needed for severe  pain., Disp: 8 tablet, Rfl: 0 .  RESTASIS MULTIDOSE 0.05 % ophthalmic emulsion, PLACE 1 DROP IN EACH EYE 2 TIMES A DAY, Disp: , Rfl: 3 .  sertraline (ZOLOFT) 100 MG tablet, Take 100 mg by mouth every morning. , Disp: , Rfl:   Vitals:   08/20/17 1436 08/20/17 1446  BP: (!) 194/134 (!) 182/131  Pulse: 84 84  Resp: 16   Temp: 97.8 F (36.6 C)   TempSrc: Oral   SpO2: 99%   Weight: 170 lb (77.1 kg)   Height: 5\' 2"  (1.575 m)     Body mass index is 31.09 kg/m.         Review of Systems Denies chest pain, dyspnea on exertion, PND, orthopnea, hemoptysis    Objective:   Physical Exam BP (!) 182/131 (BP Location: Left Arm, Patient Position: Sitting, Cuff Size: Normal)   Pulse 84   Temp 97.8 F (36.6 C) (Oral)   Resp 16   Ht 5\' 2"  (1.575 m)   Wt 170 lb (77.1 kg)   LMP 10/18/2011   SpO2 99%   BMI 31.09 kg/m   Gen. well-developed well-nourished female no apparent distress alert and oriented 3 Lungs no rhonchi or wheezing Left leg with large bulging varicosities in the medial thigh extending down into the medial calf with 1+  distal edema. No active ulceration noted.  Today I ordered a venous duplex exam the left leg which I reviewed and interpreted. There is no DVT. There is gross reflux throughout a large left great saphenous vein supplying these painful varicosities     Assessment:     Painful varicosities left leg due to gross reflux left great saphenous vein causing symptoms which are affecting patient's daily living and resistant to conservative measures including long-leg elastic compression stockings 20-30 millimeter gradient, elevation, and ibuprofen    Plan:     Patient needs laser ablation left great saphenous vein plus greater than 20 stab phlebectomy of painful varicosities to be performed as a single procedure We'll proceed with this in the near future to relieve her symptoms

## 2017-08-29 ENCOUNTER — Other Ambulatory Visit: Payer: Self-pay | Admitting: *Deleted

## 2017-08-29 DIAGNOSIS — I83892 Varicose veins of left lower extremities with other complications: Secondary | ICD-10-CM

## 2017-09-27 ENCOUNTER — Encounter: Payer: Self-pay | Admitting: Vascular Surgery

## 2017-09-27 ENCOUNTER — Ambulatory Visit (INDEPENDENT_AMBULATORY_CARE_PROVIDER_SITE_OTHER): Payer: Medicaid Other | Admitting: Vascular Surgery

## 2017-09-27 VITALS — BP 154/100 | HR 74 | Temp 98.2°F | Resp 18 | Ht 62.0 in | Wt 170.0 lb

## 2017-09-27 DIAGNOSIS — I83892 Varicose veins of left lower extremities with other complications: Secondary | ICD-10-CM | POA: Diagnosis not present

## 2017-09-27 HISTORY — PX: ENDOVENOUS ABLATION SAPHENOUS VEIN W/ LASER: SUR449

## 2017-09-27 NOTE — Progress Notes (Signed)
Laser Ablation Procedure    Date: 09/27/2017   Amanda Cardenas DOB:[birth year]  Consent signed: Yes    Surgeon:  Dr. Nelda Severe. Kellie Simmering  Procedure: Laser Ablation: left Greater Saphenous Vein  BP (!) 154/100 (BP Location: Left Arm, Patient Position: Sitting, Cuff Size: Normal)   Pulse 74   Temp 98.2 F (36.8 C) (Oral)   Resp 18   Ht 5\' 2"  (1.575 m)   Wt 170 lb (77.1 kg)   LMP 10/18/2011   SpO2 97%   BMI 31.09 kg/m   Tumescent Anesthesia: 500 cc 0.9% NaCl with 50 cc Lidocaine HCL 1%  and 15 cc 8.4% NaHCO3  Local Anesthesia: 11 cc Lidocaine HCL and NaHCO3 (ratio 2:1)  Pulsed Mode: 15 watts, 557ms delay, 1.0 duration  Total Energy:   1479  Joules         Total Pulses: 99               Total Time: 1:38   Stab Phlebectomy: >20 Sites: Thigh and Calf  Patient tolerated procedure well    Description of Procedure:  After marking the course of the secondary varicosities, the patient was placed on the operating table in the supine position, and the left leg was prepped and draped in sterile fashion.   Local anesthetic was administered and under ultrasound guidance the saphenous vein was accessed with a micro needle and guide wire; then the mirco puncture sheath was placed.  A guide wire was inserted saphenofemoral junction , followed by a 5 french sheath.  The position of the sheath and then the laser fiber below the junction was confirmed using the ultrasound.  Tumescent anesthesia was administered along the course of the saphenous vein using ultrasound guidance. The patient was placed in Trendelenburg position and protective laser glasses were placed on patient and staff, and the laser was fired at 15 watts continuous mode advancing 1-51mm/second for a total of 1479 joules.   For stab phlebectomies, local anesthetic was administered at the previously marked varicosities, and tumescent anesthesia was administered around the vessels.  Greater than 20 stab wounds were made using the tip of an 11  blade. And using the vein hook, the phlebectomies were performed using a hemostat to avulse the varicosities.  Adequate hemostasis was achieved.     Steri strips were applied to the stab wounds and ABD pads and thigh high compression stockings were applied.  Ace wrap bandages were applied over the phlebectomy sites and at the top of the saphenofemoral junction. Blood loss was less than 15 cc.  The patient ambulated out of the operating room having tolerated the procedure well.

## 2017-09-27 NOTE — Progress Notes (Signed)
Subjective:     Patient ID: Amanda Cardenas, female   DOB: 1965/11/28, 51 y.o.   MRN: [id number]  HPI  This 51 year old female had laser ablation left great saphenous vein from the distal thigh to near the saphenofemoral junction plus greater than 20 stab phlebectomy of painful varicosities performed under local tumescent anesthesia. She tolerated both procedures well. A total of 1479 J of energy was utilized.  Review of Systems     Objective:   Physical Exam BP (!) 154/100 (BP Location: Left Arm, Patient Position: Sitting, Cuff Size: Normal)   Pulse 74   Temp 98.2 F (36.8 C) (Oral)   Resp 18   Ht 5\' 2"  (1.575 m)   Wt 170 lb (77.1 kg)   LMP 10/18/2011   SpO2 97%   BMI 31.09 kg/m       Assessment:     Well-tolerated laser ablation left great saphenous vein plus greater than 20 stab phlebectomy of painful varicosities performed under local tumescent anesthesia    Plan:     Return January 7 for venous duplex exam confirmed closure left great saphenous vein

## 2017-10-03 ENCOUNTER — Encounter: Payer: Self-pay | Admitting: Vascular Surgery

## 2017-10-08 ENCOUNTER — Ambulatory Visit (HOSPITAL_COMMUNITY)
Admission: RE | Admit: 2017-10-08 | Discharge: 2017-10-08 | Disposition: A | Discharge status: Home / Self Care | Payer: Medicaid Other | Source: Ambulatory Visit | Attending: Vascular Surgery | Admitting: Vascular Surgery

## 2017-10-08 ENCOUNTER — Ambulatory Visit (INDEPENDENT_AMBULATORY_CARE_PROVIDER_SITE_OTHER): Payer: Medicaid Other | Admitting: Vascular Surgery

## 2017-10-08 ENCOUNTER — Encounter: Payer: Self-pay | Admitting: Vascular Surgery

## 2017-10-08 ENCOUNTER — Other Ambulatory Visit: Payer: Self-pay

## 2017-10-08 VITALS — BP 152/112 | HR 83 | Temp 97.7°F | Resp 16 | Ht 62.0 in | Wt 165.0 lb

## 2017-10-08 DIAGNOSIS — I83892 Varicose veins of left lower extremities with other complications: Secondary | ICD-10-CM | POA: Diagnosis not present

## 2017-10-08 NOTE — Progress Notes (Signed)
Subjective:     Patient ID: Amanda Cardenas, female   DOB: [birth year], 52 y.o.   MRN: [id number]  HPI  This 52 year old female is one week post-laser ablation left great saphenous vein with multiple stab phlebectomy of painful varicosities. She returns today with no complaints. She has minimal discomfort in the left thigh. She's had no distal edema. She has worn her elastic compression stocking and taken ibuprofen as instructed. She denies chest pain dyspnea on exertion or hemoptysis.    Review of Systems     Objective:   Physical Exam BP (!) 152/112 (BP Location: Left Arm, Patient Position: Sitting, Cuff Size: Normal)   Pulse 83   Temp 97.7 F (36.5 C) (Oral)   Resp 16   Ht 5\' 2"  (1.575 m)   Wt 165 lb (74.8 kg)   LMP 10/18/2011   BMI 30.18 kg/m   Gen. well-developed well-nourished female no apparent distress alert nor in 3 Lungs no rhonchi or wheezing Left leg with minimal discomfort to palpation of great saphenous vein stab phlebectomy sites healing nicely. No distal edema noted. 3+ dorsalis pedis pulse palpable.  Today or venous duplex exam the left leg which I reviewed and interpreted. There is no DVT. There is total closure of the left great saphenous vein up to near the saphenofemoral junction    Assessment:     Successful laser ablation left great saphenous vein with multiple stab phlebectomy of painful varicosities with excellent early results    Plan:       Return to see me on a when necessary basis

## 2017-10-08 NOTE — Progress Notes (Signed)
Vitals:   10/08/17 1330  BP: (!) 175/108  Pulse: 85  Resp: 16  Temp: 97.7 F (36.5 C)  TempSrc: Oral  Weight: 165 lb (74.8 kg)  Height: 5\' 2"  (1.575 m)

## 2017-10-25 ENCOUNTER — Encounter (HOSPITAL_COMMUNITY)
Admission: EM | Disposition: A | Discharge status: Home / Self Care | Payer: Self-pay | Source: Home / Self Care | Attending: Cardiology

## 2017-10-25 ENCOUNTER — Emergency Department (HOSPITAL_COMMUNITY): Payer: Medicaid Other

## 2017-10-25 ENCOUNTER — Inpatient Hospital Stay (HOSPITAL_COMMUNITY)
Admission: EM | Admit: 2017-10-25 | Discharge: 2017-10-26 | DRG: 282 | Disposition: A | Discharge status: Home / Self Care | Payer: Medicaid Other | Attending: Cardiology | Admitting: Cardiology

## 2017-10-25 ENCOUNTER — Other Ambulatory Visit: Payer: Self-pay

## 2017-10-25 ENCOUNTER — Encounter (HOSPITAL_COMMUNITY): Payer: Self-pay | Admitting: *Deleted

## 2017-10-25 DIAGNOSIS — I872 Venous insufficiency (chronic) (peripheral): Secondary | ICD-10-CM | POA: Diagnosis present

## 2017-10-25 DIAGNOSIS — I2510 Atherosclerotic heart disease of native coronary artery without angina pectoris: Secondary | ICD-10-CM | POA: Diagnosis present

## 2017-10-25 DIAGNOSIS — E781 Pure hyperglyceridemia: Secondary | ICD-10-CM | POA: Diagnosis present

## 2017-10-25 DIAGNOSIS — M109 Gout, unspecified: Secondary | ICD-10-CM | POA: Diagnosis present

## 2017-10-25 DIAGNOSIS — M1990 Unspecified osteoarthritis, unspecified site: Secondary | ICD-10-CM | POA: Diagnosis present

## 2017-10-25 DIAGNOSIS — Z79899 Other long term (current) drug therapy: Secondary | ICD-10-CM

## 2017-10-25 DIAGNOSIS — Z888 Allergy status to other drugs, medicaments and biological substances status: Secondary | ICD-10-CM | POA: Diagnosis not present

## 2017-10-25 DIAGNOSIS — F17210 Nicotine dependence, cigarettes, uncomplicated: Secondary | ICD-10-CM | POA: Diagnosis present

## 2017-10-25 DIAGNOSIS — I10 Essential (primary) hypertension: Secondary | ICD-10-CM | POA: Diagnosis present

## 2017-10-25 DIAGNOSIS — I214 Non-ST elevation (NSTEMI) myocardial infarction: Principal | ICD-10-CM | POA: Diagnosis present

## 2017-10-25 DIAGNOSIS — Z9889 Other specified postprocedural states: Secondary | ICD-10-CM

## 2017-10-25 DIAGNOSIS — I25111 Atherosclerotic heart disease of native coronary artery with angina pectoris with documented spasm: Secondary | ICD-10-CM

## 2017-10-25 HISTORY — PX: LEFT HEART CATH AND CORONARY ANGIOGRAPHY: CATH118249

## 2017-10-25 HISTORY — DX: Other specified postprocedural states: Z98.890

## 2017-10-25 HISTORY — DX: Atherosclerotic heart disease of native coronary artery with angina pectoris with documented spasm: I25.111

## 2017-10-25 LAB — BASIC METABOLIC PANEL
Anion gap: 13 (ref 5–15)
BUN: 20 mg/dL (ref 6–20)
CO2: 20 mmol/L — ABNORMAL LOW (ref 22–32)
CREATININE: 0.79 mg/dL (ref 0.44–1.00)
Calcium: 9 mg/dL (ref 8.9–10.3)
Chloride: 105 mmol/L (ref 101–111)
GFR calc Af Amer: >60 mL/min (ref >60)
Glucose, Bld: 111 mg/dL — ABNORMAL HIGH (ref 65–99)
Potassium: 4.1 mmol/L (ref 3.5–5.1)
SODIUM: 138 mmol/L (ref 135–145)

## 2017-10-25 LAB — I-STAT TROPONIN, ED: TROPONIN I, POC: 0.09 ng/mL — AB (ref 0.00–0.08)

## 2017-10-25 LAB — TROPONIN I
Troponin I: 0.09 ng/mL (ref <0.03)
Troponin I: 0.08 ng/mL (ref <0.03)
Troponin I: 0.08 ng/mL (ref <0.03)
Troponin I: 0.09 ng/mL (ref <0.03)

## 2017-10-25 LAB — CBC
HCT: 44.5 % (ref 36.0–46.0)
Hemoglobin: 14.6 g/dL (ref 12.0–15.0)
MCH: 31.5 pg (ref 26.0–34.0)
MCHC: 32.8 g/dL (ref 30.0–36.0)
MCV: 96.1 fL (ref 78.0–100.0)
PLATELETS: 286 10*3/uL (ref 150–400)
RBC: 4.63 MIL/uL (ref 3.87–5.11)
RDW: 13.7 % (ref 11.5–15.5)
WBC: 10 10*3/uL (ref 4.0–10.5)

## 2017-10-25 LAB — TSH: TSH: 0.031 u[IU]/mL — ABNORMAL LOW (ref 0.350–4.500)

## 2017-10-25 LAB — LIPID PANEL
CHOL/HDL RATIO: 3.5 ratio
CHOLESTEROL: 163 mg/dL (ref 0–200)
HDL: 46 mg/dL (ref >40)
LDL Cholesterol: 74 mg/dL (ref 0–99)
TRIGLYCERIDES: 213 mg/dL — AB (ref <150)
VLDL: 43 mg/dL — AB (ref 0–40)

## 2017-10-25 LAB — PROTIME-INR
INR: 1.05
Prothrombin Time: 13.6 seconds (ref 11.4–15.2)

## 2017-10-25 LAB — HEPARIN LEVEL (UNFRACTIONATED): Heparin Unfractionated: 0.2 IU/mL — ABNORMAL LOW (ref 0.30–0.70)

## 2017-10-25 SURGERY — LEFT HEART CATH AND CORONARY ANGIOGRAPHY
Anesthesia: LOCAL

## 2017-10-25 MED ORDER — HEPARIN (PORCINE) IN NACL 100-0.45 UNIT/ML-% IJ SOLN
850.0000 [IU]/h | INTRAMUSCULAR | Status: DC
  Administered 2017-10-25: 850 [IU]/h via INTRAVENOUS
  Filled 2017-10-25: qty 250

## 2017-10-25 MED ORDER — ALLOPURINOL 300 MG PO TABS
300.0000 mg | ORAL_TABLET | ORAL | Status: DC
  Administered 2017-10-25: 300 mg via ORAL
  Filled 2017-10-25 (×2): qty 1

## 2017-10-25 MED ORDER — OXYCODONE-ACETAMINOPHEN 7.5-325 MG PO TABS
1.0000 | ORAL_TABLET | Freq: Four times a day (QID) | ORAL | Status: DC | PRN
  Administered 2017-10-25 – 2017-10-26 (×5): 1 via ORAL
  Filled 2017-10-25 (×5): qty 1

## 2017-10-25 MED ORDER — AMLODIPINE BESYLATE 5 MG PO TABS
5.0000 mg | ORAL_TABLET | Freq: Every day | ORAL | Status: DC
  Administered 2017-10-25 – 2017-10-26 (×2): 5 mg via ORAL
  Filled 2017-10-25 (×3): qty 1

## 2017-10-25 MED ORDER — HYDROMORPHONE HCL 1 MG/ML IJ SOLN
INTRAMUSCULAR | Status: AC
  Filled 2017-10-25: qty 0.5

## 2017-10-25 MED ORDER — ATORVASTATIN CALCIUM 80 MG PO TABS
80.0000 mg | ORAL_TABLET | Freq: Every day | ORAL | Status: DC
  Administered 2017-10-25: 80 mg via ORAL
  Filled 2017-10-25 (×2): qty 1

## 2017-10-25 MED ORDER — VERAPAMIL HCL 2.5 MG/ML IV SOLN
INTRA_ARTERIAL | Status: DC | PRN
  Administered 2017-10-25: 10 mL via INTRA_ARTERIAL

## 2017-10-25 MED ORDER — BUPROPION HCL ER (SR) 150 MG PO TB12
150.0000 mg | ORAL_TABLET | Freq: Two times a day (BID) | ORAL | Status: DC
  Administered 2017-10-25 – 2017-10-26 (×3): 150 mg via ORAL
  Filled 2017-10-25 (×3): qty 1

## 2017-10-25 MED ORDER — ACETAMINOPHEN 325 MG PO TABS: 650.0000 mg | ORAL_TABLET | ORAL | Status: DC | PRN

## 2017-10-25 MED ORDER — SODIUM CHLORIDE 0.9 % IV SOLN: 250.0000 mL | INTRAVENOUS | Status: DC | PRN

## 2017-10-25 MED ORDER — MIDAZOLAM HCL 2 MG/2ML IJ SOLN
INTRAMUSCULAR | Status: DC | PRN
  Administered 2017-10-25: 2 mg via INTRAVENOUS

## 2017-10-25 MED ORDER — LIDOCAINE HCL (PF) 1 % IJ SOLN
INTRAMUSCULAR | Status: AC
  Filled 2017-10-25: qty 30

## 2017-10-25 MED ORDER — SODIUM CHLORIDE 0.9 % WEIGHT BASED INFUSION
1.0000 mL/kg/h | INTRAVENOUS | Status: AC
  Administered 2017-10-25: 1 mL/kg/h via INTRAVENOUS

## 2017-10-25 MED ORDER — SERTRALINE HCL 100 MG PO TABS
100.0000 mg | ORAL_TABLET | ORAL | Status: DC
  Administered 2017-10-25 – 2017-10-26 (×2): 100 mg via ORAL
  Filled 2017-10-25 (×2): qty 1

## 2017-10-25 MED ORDER — ISOSORBIDE MONONITRATE ER 60 MG PO TB24
60.0000 mg | ORAL_TABLET | Freq: Every day | ORAL | Status: DC
  Administered 2017-10-25 – 2017-10-26 (×2): 60 mg via ORAL
  Filled 2017-10-25 (×3): qty 1

## 2017-10-25 MED ORDER — NITROGLYCERIN IN D5W 200-5 MCG/ML-% IV SOLN
0.0000 ug/min | INTRAVENOUS | Status: DC
  Administered 2017-10-25: 5 ug/min via INTRAVENOUS
  Filled 2017-10-25: qty 250

## 2017-10-25 MED ORDER — IOPAMIDOL (ISOVUE-370) INJECTION 76%
INTRAVENOUS | Status: DC | PRN
  Administered 2017-10-25: 95 mL via INTRA_ARTERIAL

## 2017-10-25 MED ORDER — LISINOPRIL 20 MG PO TABS
20.0000 mg | ORAL_TABLET | ORAL | Status: DC
  Administered 2017-10-25 – 2017-10-26 (×2): 20 mg via ORAL
  Filled 2017-10-25 (×3): qty 1

## 2017-10-25 MED ORDER — HEPARIN SODIUM (PORCINE) 1000 UNIT/ML IJ SOLN
INTRAMUSCULAR | Status: AC
  Filled 2017-10-25: qty 1

## 2017-10-25 MED ORDER — MIDAZOLAM HCL 2 MG/2ML IJ SOLN
INTRAMUSCULAR | Status: AC
  Filled 2017-10-25: qty 2

## 2017-10-25 MED ORDER — SODIUM CHLORIDE 0.9 % WEIGHT BASED INFUSION: 1.0000 mL/kg/h | INTRAVENOUS | Status: DC

## 2017-10-25 MED ORDER — ASPIRIN 81 MG PO CHEW
162.0000 mg | CHEWABLE_TABLET | Freq: Once | ORAL | Status: AC
  Administered 2017-10-25: 162 mg via ORAL
  Filled 2017-10-25: qty 2

## 2017-10-25 MED ORDER — HEPARIN (PORCINE) IN NACL 2-0.9 UNIT/ML-% IJ SOLN
INTRAMUSCULAR | Status: AC | PRN
  Administered 2017-10-25: 1000 mL

## 2017-10-25 MED ORDER — SODIUM CHLORIDE 0.9% FLUSH: 3.0000 mL | INTRAVENOUS | Status: DC | PRN

## 2017-10-25 MED ORDER — SODIUM CHLORIDE 0.9% FLUSH: 3.0000 mL | Freq: Two times a day (BID) | INTRAVENOUS | Status: DC

## 2017-10-25 MED ORDER — IOPAMIDOL (ISOVUE-370) INJECTION 76%
INTRAVENOUS | Status: AC
  Filled 2017-10-25: qty 125

## 2017-10-25 MED ORDER — HEPARIN (PORCINE) IN NACL 2-0.9 UNIT/ML-% IJ SOLN
INTRAMUSCULAR | Status: AC
  Filled 2017-10-25: qty 1000

## 2017-10-25 MED ORDER — NITROGLYCERIN 0.4 MG SL SUBL: 0.4000 mg | SUBLINGUAL_TABLET | SUBLINGUAL | Status: DC | PRN

## 2017-10-25 MED ORDER — ASPIRIN EC 81 MG PO TBEC
81.0000 mg | DELAYED_RELEASE_TABLET | Freq: Every day | ORAL | Status: DC
  Administered 2017-10-26: 81 mg via ORAL
  Filled 2017-10-25: qty 1

## 2017-10-25 MED ORDER — HYDROMORPHONE HCL 1 MG/ML IJ SOLN
INTRAMUSCULAR | Status: DC | PRN
  Administered 2017-10-25 (×3): 0.5 mg via INTRAVENOUS

## 2017-10-25 MED ORDER — METOPROLOL SUCCINATE ER 25 MG PO TB24
25.0000 mg | ORAL_TABLET | Freq: Every day | ORAL | Status: DC
  Administered 2017-10-25: 25 mg via ORAL
  Filled 2017-10-25: qty 1

## 2017-10-25 MED ORDER — LIDOCAINE HCL (PF) 1 % IJ SOLN
INTRAMUSCULAR | Status: DC | PRN
  Administered 2017-10-25: 2 mL via SUBCUTANEOUS

## 2017-10-25 MED ORDER — HEPARIN (PORCINE) IN NACL 100-0.45 UNIT/ML-% IJ SOLN
1050.0000 [IU]/h | INTRAMUSCULAR | Status: DC
  Administered 2017-10-25: 1050 [IU]/h via INTRAVENOUS
  Filled 2017-10-25: qty 250

## 2017-10-25 MED ORDER — HEPARIN BOLUS VIA INFUSION
4000.0000 [IU] | Freq: Once | INTRAVENOUS | Status: AC
  Administered 2017-10-25: 4000 [IU] via INTRAVENOUS
  Filled 2017-10-25: qty 4000

## 2017-10-25 MED ORDER — SODIUM CHLORIDE 0.9 % WEIGHT BASED INFUSION: 3.0000 mL/kg/h | INTRAVENOUS | Status: DC

## 2017-10-25 MED ORDER — GABAPENTIN 400 MG PO CAPS
800.0000 mg | ORAL_CAPSULE | Freq: Three times a day (TID) | ORAL | Status: DC
  Administered 2017-10-25 – 2017-10-26 (×4): 800 mg via ORAL
  Filled 2017-10-25 (×4): qty 2

## 2017-10-25 MED ORDER — HEPARIN SODIUM (PORCINE) 1000 UNIT/ML IJ SOLN
INTRAMUSCULAR | Status: DC | PRN
  Administered 2017-10-25: 5000 [IU] via INTRAVENOUS

## 2017-10-25 MED ORDER — NITROGLYCERIN 1 MG/10 ML FOR IR/CATH LAB
INTRA_ARTERIAL | Status: AC
  Filled 2017-10-25: qty 10

## 2017-10-25 SURGICAL SUPPLY — 12 items
CATH INFINITI 5 FR AR1 MOD (CATHETERS) ×1 IMPLANT
CATH INFINITI 5FR ANG PIGTAIL (CATHETERS) ×1 IMPLANT
CATH INFINITI JR4 5F (CATHETERS) ×1 IMPLANT
CATH OPTITORQUE TIG 4.0 5F (CATHETERS) ×1 IMPLANT
DEVICE RAD COMP TR BAND LRG (VASCULAR PRODUCTS) ×1 IMPLANT
GLIDESHEATH SLEND A-KIT 6F 20G (SHEATH) ×1 IMPLANT
GUIDEWIRE INQWIRE 1.5J.035X260 (WIRE) IMPLANT
INQWIRE 1.5J .035X260CM (WIRE) ×2
KIT HEART LEFT (KITS) ×2 IMPLANT
PACK CARDIAC CATHETERIZATION (CUSTOM PROCEDURE TRAY) ×2 IMPLANT
TRANSDUCER W/STOPCOCK (MISCELLANEOUS) ×2 IMPLANT
TUBING CIL FLEX 10 FLL-RA (TUBING) ×2 IMPLANT

## 2017-10-25 NOTE — Interval H&P Note (Signed)
History and Physical Interval Note:  10/25/2017 4:11 PM  Valita H Mendibles  has presented today for surgery, with the diagnosis of Nstemi  The various methods of treatment have been discussed with the patient and family. After consideration of risks, benefits and other options for treatment, the patient has consented to  Procedure(s): LEFT HEART CATH AND CORONARY ANGIOGRAPHY (N/A)  And possible PCI as a surgical intervention .  The patient's history has been reviewed, patient examined, no change in status, stable for surgery.  I have reviewed the patient's chart and labs.  Questions were answered to the patient's satisfaction.   Cath Lab Visit (complete for each Cath Lab visit)  Clinical Evaluation Leading to the Procedure:   ACS: Yes.    Non-ACS:    Anginal Classification: CCS IV  Anti-ischemic medical therapy: Minimal Therapy (1 class of medications)  Non-Invasive Test Results: No non-invasive testing performed  Prior CABG: No previous CABG        Adrian Prows

## 2017-10-25 NOTE — ED Notes (Signed)
Pt. To xray via stretcher

## 2017-10-25 NOTE — ED Notes (Signed)
Date and time results received: 10/25/17 0745 (use smartphrase ".now" to insert current time)  Test: troponin i Critical Value: 0.09  Name of Provider Notified: Dr. Kathrynn Humble  Orders Received? Or Actions Taken?: NNO. Waiting for Dr. Nadyne Coombes to arrive and see patient.

## 2017-10-25 NOTE — ED Triage Notes (Signed)
Pt has been having left sided chest pain at 0330 that woke her from sleep.Was seen one week ago for similar pain and given nitro. Pt took nitro x3 and 81mg  x 2 with improvement. Also has sob with radiation down bilateral arms .

## 2017-10-25 NOTE — ED Notes (Signed)
Dr. Nadyne Coombes at bedside with patient.

## 2017-10-25 NOTE — ED Notes (Signed)
Dr Kathrynn Humble given a copy of troponin results .09

## 2017-10-25 NOTE — Progress Notes (Signed)
ANTICOAGULATION CONSULT NOTE - Initial Consult  Pharmacy Consult for heparin Indication: chest pain/ACS  Allergies  Allergen Reactions  . Prednisone Other (See Comments)    Pt reports hyperactivity, "makes me crazy"    Patient Measurements: Height: 5\' 2"  (157.5 cm) Weight: 158 lb (71.7 kg) IBW/kg (Calculated) : 50.1 Heparin Dosing Weight: 65.3 kg  Vital Signs: Temp: 98.3 F (36.8 C) (01/24 0510) Temp Source: Oral (01/24 0510) BP: 122/87 (01/24 0545) Pulse Rate: 61 (01/24 0545)  Labs: Recent Labs    10/25/17 0520  HGB 14.6  HCT 44.5  PLT 286    CrCl cannot be calculated (Patient's most recent lab result is older than the maximum 21 days allowed.).   Medical History: Past Medical History:  Diagnosis Date  . Arthritis   . Baker cyst   . Gout   . Hypertension     Assessment: L-sided chest pain with radiation to arms and SOB.  Minimally elevated troponin. Received asa 162 mg in ED and took 162 mg prior to arrival for a total of 324 mg. CBC stable Starting heparin gtt for rule out ACS.  Goal of Therapy:  Heparin level 0.3-0.7 units/ml Monitor platelets by anticoagulation protocol: Yes   Plan:  -Heparin bolus 4000 units x1 then 850 units/hr -Daily HL, CBC -First level in 6 hours   Harvel Quale 10/25/2017,6:02 AM

## 2017-10-25 NOTE — ED Notes (Signed)
Heparin verified with Perpetue, RN

## 2017-10-25 NOTE — Progress Notes (Signed)
ANTICOAGULATION CONSULT NOTE - Follow-up Consult  Pharmacy Consult for heparin Indication: chest pain/ACS  Allergies  Allergen Reactions  . Prednisone Other (See Comments)    Pt reports hyperactivity, "makes me crazy"    Patient Measurements: Height: 5\' 2"  (157.5 cm) Weight: 158 lb (71.7 kg) IBW/kg (Calculated) : 50.1 Heparin Dosing Weight: 65.3 kg  Vital Signs: Temp: 98.3 F (36.8 C) (01/24 0510) Temp Source: Oral (01/24 0510) BP: 123/83 (01/24 1045) Pulse Rate: 59 (01/24 1045)  Labs: Recent Labs    10/25/17 0520 10/25/17 0617 10/25/17 1229 10/25/17 1230  HGB 14.6  --   --   --   HCT 44.5  --   --   --   PLT 286  --   --   --   LABPROT  --   --  13.6  --   INR  --   --  1.05  --   HEPARINUNFRC  --   --   --  0.20*  CREATININE 0.79  --   --   --   TROPONINI  --  0.09*  --   --     Estimated Creatinine Clearance: 75.4 mL/min (by C-G formula based on SCr of 0.79 mg/dL).   Medical History: Past Medical History:  Diagnosis Date  . Arthritis   . Baker cyst   . Gout   . Hypertension     Assessment: L-sided chest pain with radiation to arms and SOB.  Minimally elevated troponin. Started heparin gtt for rule out ACS.  Received asa 162 mg in ED and took 162 mg prior to arrival for a total of 324 mg.  Heparin level subtherapeutic at 0.2 units/mL. CBC stable.  Goal of Therapy:  Heparin level 0.3-0.7 units/ml Monitor platelets by anticoagulation protocol: Yes   Plan:  - Increase heparin rate to 1050 units/hr  - Monitor daily heparin level, CBC, signs/symptoms of bleeding  -Next level in 6 hours (1830)  Thank you for allowing [place] to participate in this patients care.  Mariella Saa, PharmD 10/25/2017 1:33 PM      Gearald Stonebraker C Magin Balbi 10/25/2017,1:28 PM

## 2017-10-25 NOTE — H&P (Signed)
Amanda Cardenas is an 52 y.o. female.   Chief Complaint: Chest pain HPI: Patient with history of hypertension, tobacco use disorder, history of chronic venous insufficiency involving the lower extremity status post left leg ablation 4 weeks ago, was admitted to the hospital with chest pain that started 3 weeks ago.  Chest pain is described as tightness in the middle of the chest with radiation to both arms.  Each episode would last for 5-10 minutes and sometimes up to 15 minutes and would leave itself after she rested for a while.  Since simple activities like also chores would bring on symptoms.  Last night she continue to have severe chest pains, stated that the intensity was much more severe, associated with marked dyspnea even walking within the house, took 3 sublingual nitroglycerin with partial relief and the chest pain Coming back.  Hence she presented to the emergency room.  She was started on IV nitroglycerin along with IV heparin and has not had any further recurrence of chest pain.  She does admit to chronic mild dyspnea that over the past 3-4 weeks her dyspnea has got worse even with minimal activities.  States that this is not usual for her.  No hemoptysis.  No leg edema.  She did undergo left leg vein ablation about 4 weeks ago.  Her husband is present.  Past Medical History:  Diagnosis Date  . Arthritis   . Baker cyst   . Gout   . Hypertension     Past Surgical History:  Procedure Laterality Date  . ENDOVENOUS ABLATION SAPHENOUS VEIN W/ LASER Left 09/27/2017   endovenous laser ablation left greater saphenous vein and stab phlebectomy > 20 incisions left leg by Tinnie Gens MD  . HIATAL HERNIA REPAIR  2000    Family History  Family history unknown: Yes   Social History:  reports that she has been smoking cigarettes.  She has a 6.25 pack-year smoking history. she has never used smokeless tobacco. She reports that she does not drink alcohol or use drugs.  Allergies:  Allergies   Allergen Reactions  . Prednisone Other (See Comments)    Pt reports hyperactivity, "makes me crazy"    Results for orders placed or performed during the hospital encounter of 10/25/17 (from the past 48 hour(s))  Basic metabolic panel     Status: Abnormal   Collection Time: 10/25/17  5:20 AM  Result Value Ref Range   Sodium 138 135 - 145 mmol/L   Potassium 4.1 3.5 - 5.1 mmol/L   Chloride 105 101 - 111 mmol/L   CO2 20 (L) 22 - 32 mmol/L   Glucose, Bld 111 (H) 65 - 99 mg/dL   BUN 20 6 - 20 mg/dL   Creatinine, Ser 0.79 0.44 - 1.00 mg/dL   Calcium 9.0 8.9 - 10.3 mg/dL   GFR calc non Af Amer >60 >60 mL/min   GFR calc Af Amer >60 >60 mL/min    Comment: (NOTE) The eGFR has been calculated using the CKD EPI equation. This calculation has not been validated in all clinical situations. eGFR's persistently <60 mL/min signify possible Chronic Kidney Disease.    Anion gap 13 5 - 15  CBC     Status: None   Collection Time: 10/25/17  5:20 AM  Result Value Ref Range   WBC 10.0 4.0 - 10.5 K/uL   RBC 4.63 3.87 - 5.11 MIL/uL   Hemoglobin 14.6 12.0 - 15.0 g/dL   HCT 44.5 36.0 - 46.0 %  MCV 96.1 78.0 - 100.0 fL   MCH 31.5 26.0 - 34.0 pg   MCHC 32.8 30.0 - 36.0 g/dL   RDW 13.7 11.5 - 15.5 %   Platelets 286 150 - 400 K/uL  I-stat troponin, ED     Status: Abnormal   Collection Time: 10/25/17  5:26 AM  Result Value Ref Range   Troponin i, poc 0.09 (HH) 0.00 - 0.08 ng/mL   Comment NOTIFIED PHYSICIAN    Comment 3            Comment: Due to the release kinetics of cTnI, a negative result within the first hours of the onset of symptoms does not rule out myocardial infarction with certainty. If myocardial infarction is still suspected, repeat the test at appropriate intervals.   Troponin I  (0, 3)     Status: Abnormal   Collection Time: 10/25/17  6:17 AM  Result Value Ref Range   Troponin I 0.09 (HH) <0.03 ng/mL    Comment: CRITICAL RESULT CALLED TO, READ BACK BY AND VERIFIED WITH: Trudee Kuster [phone] [place]   Lipid panel     Status: Abnormal   Collection Time: 10/25/17  6:17 AM  Result Value Ref Range   Cholesterol 163 0 - 200 mg/dL   Triglycerides 213 (H) <150 mg/dL   HDL 46 >40 mg/dL   Total CHOL/HDL Ratio 3.5 RATIO   VLDL 43 (H) 0 - 40 mg/dL   LDL Cholesterol 74 0 - 99 mg/dL    Comment:        Total Cholesterol/HDL:CHD Risk Coronary Heart Disease Risk Table                     Men   Women  1/2 Average Risk   3.4   3.3  Average Risk       5.0   4.4  2 X Average Risk   9.6   7.1  3 X Average Risk  23.4   11.0        Use the calculated Patient Ratio above and the CHD Risk Table to determine the patient's CHD Risk.        ATP III CLASSIFICATION (LDL):  <100     mg/dL   Optimal  100-129  mg/dL   Near or Above                    Optimal  130-159  mg/dL   Borderline  160-189  mg/dL   High  >190     mg/dL   Very High    Dg Chest 2 View  Result Date: 10/25/2017 CLINICAL DATA:  Shortness of breath and chest pain tonight. Chest pain for 2 weeks. EXAM: CHEST  2 VIEW COMPARISON:  12/24/2016 FINDINGS: Normal heart size and pulmonary vascularity. Left aortopulmonic window prominence corresponds to a cystic lesion on previous CT from 12/24/2016. No focal airspace disease or consolidation in the lungs. No blunting of costophrenic angles. No pneumothorax. Mediastinal contours appear intact. Large esophageal hiatal hernia behind the heart. IMPRESSION: No evidence of active pulmonary disease. Esophageal hiatal hernia behind the heart. Left mediastinal lesion along the aortopulmonic window corresponds to a cystic lesion on previous CT. Electronically Signed   By: Lucienne Capers M.D.   On: 10/25/2017 06:24    Review of Systems  Constitutional: Negative.   HENT: Negative.   Eyes: Negative.   Respiratory: Positive for shortness of breath. Negative for cough, sputum production and wheezing.  Cardiovascular: Positive for chest pain. Negative for palpitations,  orthopnea, claudication, leg swelling and PND.  Gastrointestinal: Positive for heartburn. Negative for abdominal pain, blood in stool, nausea and vomiting.  Genitourinary: Negative.   Musculoskeletal: Positive for back pain.  Skin: Negative.   Neurological: Negative.   Endo/Heme/Allergies: Negative.   Psychiatric/Behavioral: Negative.   All other systems reviewed and are negative.   Blood pressure 123/83, pulse (!) 59, temperature 98.3 F (36.8 C), temperature source Oral, resp. rate (!) 21, height '5\' 2"'  (1.575 m), weight 71.7 kg (158 lb), last menstrual period 10/18/2011, SpO2 95 %. Physical Exam  Constitutional: She is oriented to person, place, and time. She appears well-developed and well-nourished.  HENT:  Head: Atraumatic.  Eyes: Conjunctivae are normal.  Neck: Normal range of motion. Neck supple.  Cardiovascular: Normal rate, regular rhythm, normal heart sounds and intact distal pulses. Exam reveals no friction rub.  No murmur heard. Respiratory: Effort normal and breath sounds normal. No respiratory distress. She has no wheezes.  GI: Soft. Bowel sounds are normal.  Musculoskeletal: Normal range of motion. She exhibits no edema.  Neurological: She is alert and oriented to person, place, and time.  Skin: Skin is warm and dry.  Psychiatric: She has a normal mood and affect.    EKG 10/25/2017: Normal sinus rhythm, inferior T-wave invers cannot exclude ischemia  These changes are new c prior EKG. Assessment/Plan 1.  Non-STEMI 2.  Mild hypertriglyceride 3.  Tobacco use disorder 4.  Hypertension  Recommendation: Patient's symptoms are Classic for unstable an  She has positive cardiac Markers for non-ST elevation MI.   She will need left heart catheterization  evaluate further, I have discussed the risks, benefits, tinnitus with the patient and her husband at the bedside.  She is willing to proceed.  Please see orders set.  Discussed risks, benefits and alternatives of  angiogram including but not limited to <1% risk of death, stroke, MI, need for urgent surgical revascularization, renal failure, but not limited to thest. patient is willing to proceed.  Adrian Prows, MD 10/25/2017, 11:48 AM

## 2017-10-26 ENCOUNTER — Inpatient Hospital Stay (HOSPITAL_COMMUNITY): Payer: Medicaid Other

## 2017-10-26 ENCOUNTER — Encounter (HOSPITAL_COMMUNITY): Payer: Self-pay | Admitting: Cardiology

## 2017-10-26 LAB — CBC
HCT: 42 % (ref 36.0–46.0)
Hemoglobin: 13.3 g/dL (ref 12.0–15.0)
MCH: 31.1 pg (ref 26.0–34.0)
MCHC: 31.7 g/dL (ref 30.0–36.0)
MCV: 98.1 fL (ref 78.0–100.0)
PLATELETS: 253 10*3/uL (ref 150–400)
RBC: 4.28 MIL/uL (ref 3.87–5.11)
RDW: 13.8 % (ref 11.5–15.5)
WBC: 8.9 10*3/uL (ref 4.0–10.5)

## 2017-10-26 LAB — ECHOCARDIOGRAM COMPLETE
Height: 62 in
Weight: 2603.2 oz

## 2017-10-26 LAB — HIV ANTIBODY (ROUTINE TESTING W REFLEX): HIV SCREEN 4TH GENERATION: NONREACTIVE

## 2017-10-26 MED ORDER — NITROGLYCERIN 0.4 MG SL SUBL: 0.4000 mg | SUBLINGUAL_TABLET | SUBLINGUAL | 1 refills | Status: DC | PRN

## 2017-10-26 MED ORDER — ISOSORBIDE MONONITRATE ER 60 MG PO TB24: 60.0000 mg | ORAL_TABLET | Freq: Every day | ORAL | 1 refills | Status: DC

## 2017-10-26 MED ORDER — ATORVASTATIN CALCIUM 10 MG PO TABS: 10.0000 mg | ORAL_TABLET | Freq: Every day | ORAL | 1 refills | Status: DC

## 2017-10-26 MED ORDER — AMLODIPINE BESYLATE 5 MG PO TABS: 5.0000 mg | ORAL_TABLET | ORAL | 1 refills | Status: DC

## 2017-10-26 MED ORDER — NICOTINE 14 MG/24HR TD PT24: 14.0000 mg | MEDICATED_PATCH | TRANSDERMAL | 1 refills | Status: DC

## 2017-10-26 NOTE — ED Provider Notes (Signed)
[facility] Provider Note   CSN: [id number] Arrival date & time: 10/25/17  0277     History   Chief Complaint Chief Complaint  Patient presents with  . Chest Pain    HPI Amanda Cardenas is a 52 y.o. female.  HPI  52 year old female comes in with chief complaint of chest pain.  Patient reports a history of atherosclerosis, hypertension, smoking, family history of CAD.  Patient reports that she has had intermittent chest pain, for which she has seen Dr. Einar Gip.  Patient was prescribed nitro and typically her pain response to nitro.  However the current chest pain, which started at 3:30 AM and woke patient up from her sleep, has been fairly constant and has not responded to nitroglycerin. Patient denies any associated back pain, numbness, tingling, weakness in the legs.  Past Medical History:  Diagnosis Date  . Arthritis   . 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 Baker cyst   . Gout   . Hypertension     Patient Active Problem List   Diagnosis Date Noted  . NSTEMI (non-ST elevated myocardial infarction) ([place]) 10/25/2017  . Chronic venous insufficiency 05/16/2017  . Varicose veins of left lower extremity with complications 41/28/7867    Past Surgical History:  Procedure Laterality Date  . ENDOVENOUS ABLATION SAPHENOUS VEIN W/ LASER Left 09/27/2017   endovenous laser ablation left greater saphenous vein and stab phlebectomy > 20 incisions left leg by Tinnie Gens MD  . [place]  2000  . LEFT HEART CATH AND CORONARY ANGIOGRAPHY N/A 10/25/2017   Procedure: LEFT HEART CATH AND CORONARY ANGIOGRAPHY;  Surgeon: Adrian Prows, MD;  Location: [facility];  Service: Cardiovascular;  Laterality: N/A;    OB History    No data available       Home Medications    Prior to Admission medications   Medication Sig Start Date End Date Taking? Authorizing Provider  allopurinol  (ZYLOPRIM) 300 MG tablet Take 300 mg by mouth every morning.    Yes [provider]  amphetamine-dextroamphetamine (ADDERALL) 15 MG tablet Take 15 mg by mouth 2 (two) times daily.  04/03/17  Yes [provider]  buPROPion (WELLBUTRIN SR) 150 MG 12 hr tablet Take 150 mg by mouth 2 (two) times daily.   Yes [provider]  furosemide (LASIX) 20 MG tablet Take 20 mg by mouth daily.   Yes [provider]  gabapentin (NEURONTIN) 800 MG tablet Take 800 mg by mouth 3 (three) times daily.   Yes [provider]  lisinopril (PRINIVIL,ZESTRIL) 20 MG tablet Take 20 mg by mouth every morning.    Yes [provider]  oxyCODONE-acetaminophen (PERCOCET) 7.5-325 MG tablet Take 1 tablet by mouth 4 (four) times daily as needed for moderate pain.  10/01/17  Yes [provider]  sertraline (ZOLOFT) 100 MG tablet Take 100 mg by mouth every morning.    Yes [provider]  amLODipine (NORVASC) 5 MG tablet Take 1 tablet (5 mg total) by mouth daily after supper. 10/26/17   Adrian Prows, MD  atorvastatin (LIPITOR) 10 MG tablet Take 1 tablet (10 mg total) by mouth daily. 10/26/17 10/26/18  Adrian Prows, MD  isosorbide mononitrate (IMDUR) 60 MG 24 hr tablet Take 1 tablet (60 mg total) by mouth daily. 10/27/17   Adrian Prows, MD  nicotine (NICODERM CQ - DOSED IN MG/24 HOURS) 14 mg/24hr patch Place 1 patch (14  mg total) onto the skin daily. 10/26/17 12/25/17  Adrian Prows, MD  nitroGLYCERIN (NITROSTAT) 0.4 MG SL tablet Place 1 tablet (0.4 mg total) under the tongue every 5 (five) minutes x 3 doses as needed for chest pain. 10/26/17   Adrian Prows, MD    Family History Family History  Family history unknown: Yes    Social History Social History   Tobacco Use  . Smoking status: Current Some Day Smoker    Packs/day: 0.25    Years: 25.00    Pack years: 6.25    Types: Cigarettes  . Smokeless tobacco: Never Used  Substance Use Topics  . Alcohol use: No  . Drug use: No      Allergies   Prednisone   Review of Systems Review of Systems  Constitutional: Positive for activity change and diaphoresis.  Respiratory: Positive for shortness of breath.   Cardiovascular: Positive for chest pain.  Gastrointestinal: Negative for abdominal pain.  Hematological: Does not bruise/bleed easily.  All other systems reviewed and are negative.    Physical Exam Updated Vital Signs BP 120/90 (BP Location: Left Arm)   Pulse (!) 59   Temp 97.8 F (36.6 C) (Axillary)   Resp 12   Ht 5\' 2"  (1.575 m)   Wt 73.8 kg (162 lb 11.2 oz)   LMP 10/18/2011   SpO2 98%   BMI 29.76 kg/m   Physical Exam  Constitutional: She is oriented to person, place, and time. She appears well-developed.  HENT:  Head: Normocephalic and atraumatic.  Eyes: EOM are normal.  Neck: Normal range of motion. Neck supple.  Cardiovascular: Normal rate and intact distal pulses.  Pulmonary/Chest: Effort normal.  Abdominal: Bowel sounds are normal.  Musculoskeletal:       Right lower leg: She exhibits no edema.       Left lower leg: She exhibits no edema.  Neurological: She is alert and oriented to person, place, and time.  Skin: Skin is warm and dry.  Nursing note and vitals reviewed.    ED Treatments / Results  Labs (all labs ordered are listed, but only abnormal results are displayed) Labs Reviewed  BASIC METABOLIC PANEL - Abnormal; Notable for the following components:      Result Value   CO2 20 (*)    Glucose, Bld 111 (*)    All other components within normal limits  TROPONIN I - Abnormal; Notable for the following components:   Troponin I 0.09 (*)    All other components within normal limits  TROPONIN I - Abnormal; Notable for the following components:   Troponin I 0.08 (*)    All other components within normal limits  HEPARIN LEVEL (UNFRACTIONATED) - Abnormal; Notable for the following components:   Heparin Unfractionated 0.20 (*)    All other components within normal limits    TSH - Abnormal; Notable for the following components:   TSH 0.031 (*)    All other components within normal limits  TROPONIN I - Abnormal; Notable for the following components:   Troponin I 0.08 (*)    All other components within normal limits  TROPONIN I - Abnormal; Notable for the following components:   Troponin I 0.09 (*)    All other components within normal limits  LIPID PANEL - Abnormal; Notable for the following components:   Triglycerides 213 (*)    VLDL 43 (*)    All other components within normal limits  I-STAT TROPONIN, ED - Abnormal; Notable for the following components:  Troponin i, poc 0.09 (*)    All other components within normal limits  CBC  HIV ANTIBODY (ROUTINE TESTING)  PROTIME-INR  CBC    EKG  EKG Interpretation  Date/Time:  Thursday October 25 2017 06:21:25 EST Ventricular Rate:  60 PR Interval:  166 QRS Duration: 92 QT Interval:  410 QTC Calculation: 410 R Axis:   -6 Text Interpretation:  Sinus rhythm No acute changes Nonspecific ST abnormality No acute changes Nonspecific ST and T wave abnormality Confirmed by Varney Biles [phone]) on 10/25/2017 7:53:01 AM       Radiology Dg Chest 2 View  Result Date: 10/25/2017 CLINICAL DATA:  Shortness of breath and chest pain tonight. Chest pain for 2 weeks. EXAM: CHEST  2 VIEW COMPARISON:  12/24/2016 FINDINGS: Normal heart size and pulmonary vascularity. Left aortopulmonic window prominence corresponds to a cystic lesion on previous CT from 12/24/2016. No focal airspace disease or consolidation in the lungs. No blunting of costophrenic angles. No pneumothorax. Mediastinal contours appear intact. Large esophageal hiatal hernia behind the heart. IMPRESSION: No evidence of active pulmonary disease. Esophageal hiatal hernia behind the heart. Left mediastinal lesion along the aortopulmonic window corresponds to a cystic lesion on previous CT. Electronically Signed   By: Lucienne Capers M.D.   On: 10/25/2017 06:24     Procedures Procedures (including critical care time)  CRITICAL CARE Performed by: Breylen Agyeman   Total critical care time: 38 minutes  Critical care time was exclusive of separately billable procedures and treating other patients.  Critical care was necessary to treat or prevent imminent or life-threatening deterioration.  Critical care was time spent personally by me on the following activities: development of treatment plan with patient and/or surrogate as well as nursing, discussions with consultants, evaluation of patient's response to treatment, examination of patient, obtaining history from patient or surrogate, ordering and performing treatments and interventions, ordering and review of laboratory studies, ordering and review of radiographic studies, pulse oximetry and re-evaluation of patient's condition.  Medications Ordered in ED Medications  0.9% sodium chloride infusion (1 mL/kg/hr  72.4 kg Intravenous New Bag/Given 10/25/17 1832)  aspirin chewable tablet 162 mg (162 mg Oral Given 10/25/17 0611)  heparin bolus via infusion 4,000 Units (4,000 Units Intravenous Bolus from Bag 10/25/17 0629)  heparin infusion 2 units/mL in 0.9 % sodium chloride (1,000 mLs Other New Bag/Given 10/25/17 1636)     Initial Impression / Assessment and Plan / ED Course  I have reviewed the triage vital signs and the nursing notes.  Pertinent labs & imaging results that were available during my care of the patient were reviewed by me and considered in my medical decision making (see chart for details).     52 year old with history of hypertension, smoking history comes in with chief complaint of chest pain.  Chest pain has some typical features for ACS, which is highest in the differential diagnosis.  We considered PE and dissection in the differential diagnosis as well, however this appears to be unlikely.  Patient's EKG has no acute findings, however her troponin is slightly elevated.  Patient  received nitro and heparin in the ER, and her chest pain has resolved with nitroglycerin.  Dr. Einar Gip has been consulted from the cardiology side.  Final Clinical Impressions(s) / ED Diagnoses   Final diagnoses:  NSTEMI (non-ST elevated myocardial infarction) [facility])    ED Discharge Orders        Ordered    isosorbide mononitrate (IMDUR) 60 MG 24 hr tablet  Daily  10/26/17 1107    amLODipine (NORVASC) 5 MG tablet  (Dosepack) After supper     10/26/17 1107    atorvastatin (LIPITOR) 10 MG tablet  Daily     10/26/17 1107    nitroGLYCERIN (NITROSTAT) 0.4 MG SL tablet  Every 5 min x3 PRN     10/26/17 1108    nicotine (NICODERM CQ - DOSED IN MG/24 HOURS) 14 mg/24hr patch  Every 24 hours     10/26/17 [place], Chenika Nevils, MD 10/26/17 1821

## 2017-10-26 NOTE — Discharge Summary (Signed)
Physician Discharge Summary  Patient ID: Amanda Cardenas MRN: [id number] DOB/AGE: [birth year] 52 y.o.  Admit date: 10/25/2017 Discharge date: 10/26/2017  Primary Discharge Diagnosis NSTEMI probably due to coronary spasm Hypertension Tobacco use disorder  Significant Diagnostic Studies:  Coronary angiogram 10/25/2017: Normal LV systolic function without wall motion abnormality. Left dominant circulation, tortuous coronary arteries but no significant disease suggestive of hypertensive heart disease. Small nondominant RCA with anterior origin.  Ascending aortogram reveals presence of 3 aortic valve cusps without aortic regurgitation, no dissection was evident, no aneurysm.  Echocardiogram 10/26/2017: - Left ventricle: The cavity size was normal. Systolic function was   normal. Wall motion was normal; there were no regional wall   motion abnormalities. - Left atrium: The atrium was mildly dilated.   EKG 10/25/2017: Normal sinus rhythm, inferior T-wave invers cannot exclude ischemia  These changes are new compared to prior EKG.  EKG 10/26/2017: Normal sinus rhythm. Previously noted inferior T-wave inversion no longer present.  Hospital Course:  Patient was admitted through the emergency room when she presented with chest tightness on and off, had positive cardiac troponin at 0.8 associated with abnormal EKG with new T-wave inversion in the inferior leads. She was taken to the cardiac catheterization lab which revealed normal coronary arteries however they were very tortuous. She was started on vasodilator therapy, had no further recurrence of chest pain. She was felt stable for discharge. Aspirin was not prescribed as she has normal coronary arteries.  Recommendations on discharge: Patient's lipids were fairly normal, mild elevation in triglycerides. I started her on a low dose of Lipitor at 10 mg. As she has normal coronary arteries, I suspected coronary spasm to be the etiology for her non-STEMI.  Probably this is contributed by tobacco use. Smoking cessation has been discussed. I will discontinue atenolol and add isosorbide mononitrate along with amlodipine for vasodilatory effects. She has an appointment to see me in 10 days which she will keep. If she remains stable, I will see her back on a when necessary basis.  Discharge Exam: Blood pressure 120/90, pulse (!) 59, temperature 97.8 F (36.6 C), temperature source Axillary, resp. rate 12, height 5\' 2"  (1.575 m), weight 73.8 kg (162 lb 11.2 oz), last menstrual period 10/18/2011, SpO2 98 %.   Physical Exam  Constitutional: She is oriented to person, place, and time and well-developed, well-nourished, and in no distress. No distress.  HENT:  Head: Normocephalic.  Eyes: Conjunctivae are normal.  Neck: Normal range of motion. Neck supple.  Cardiovascular: Normal rate, regular rhythm, normal heart sounds and intact distal pulses. Exam reveals no friction rub.  No murmur heard. Right radial access no complications  Pulmonary/Chest: Effort normal.  Abdominal: Soft. Bowel sounds are normal.  Musculoskeletal: Normal range of motion.  Neurological: She is alert and oriented to person, place, and time.  Skin: Skin is warm and dry. She is not diaphoretic.  Psychiatric: Affect normal.    Labs:   Lab Results  Component Value Date   WBC 8.9 10/26/2017   HGB 13.3 10/26/2017   HCT 42.0 10/26/2017   MCV 98.1 10/26/2017   PLT 253 10/26/2017    Recent Labs  Lab 10/25/17 0520  NA 138  K 4.1  CL 105  CO2 20*  BUN 20  CREATININE 0.79  CALCIUM 9.0  GLUCOSE 111*    Lipid Panel     Component Value Date/Time   CHOL 163 10/25/2017 0617   TRIG 213 (H) 10/25/2017 0617   HDL 46 10/25/2017 0617  CHOLHDL 3.5 10/25/2017 0617   VLDL 43 (H) 10/25/2017 0617   LDLCALC 74 10/25/2017 0617   Cardiac Panel (last 3 results) Recent Labs    10/25/17 1229 10/25/17 1425 10/25/17 2028  TROPONINI 0.08* 0.08* 0.09*    Lab Results  Component  Value Date   TROPONINI 0.09 ([place]) 10/25/2017     TSH Recent Labs    10/25/17 1228  TSH 0.031*    Radiology: Dg Chest 2 View  Result Date: 10/25/2017 CLINICAL DATA:  Shortness of breath and chest pain tonight. Chest pain for 2 weeks. EXAM: CHEST  2 VIEW COMPARISON:  12/24/2016 FINDINGS: Normal heart size and pulmonary vascularity. Left aortopulmonic window prominence corresponds to a cystic lesion on previous CT from 12/24/2016. No focal airspace disease or consolidation in the lungs. No blunting of costophrenic angles. No pneumothorax. Mediastinal contours appear intact. Large esophageal hiatal hernia behind the heart. IMPRESSION: No evidence of active pulmonary disease. Esophageal hiatal hernia behind the heart. Left mediastinal lesion along the aortopulmonic window corresponds to a cystic lesion on previous CT. Electronically Signed   By: Lucienne Capers M.D.   On: 10/25/2017 06:24      FOLLOW UP PLANS AND APPOINTMENTS  Allergies as of 10/26/2017      Reactions   Prednisone Other (See Comments)   Pt reports hyperactivity, "makes me crazy"      Medication List    STOP taking these medications   atenolol 50 MG tablet Commonly known as:  TENORMIN     TAKE these medications   allopurinol 300 MG tablet Commonly known as:  ZYLOPRIM Take 300 mg by mouth every morning.   amLODipine 5 MG tablet Commonly known as:  NORVASC Take 1 tablet (5 mg total) by mouth daily after supper.   amphetamine-dextroamphetamine 15 MG tablet Commonly known as:  ADDERALL Take 15 mg by mouth 2 (two) times daily.   atorvastatin 10 MG tablet Commonly known as:  LIPITOR Take 1 tablet (10 mg total) by mouth daily.   buPROPion 150 MG 12 hr tablet Commonly known as:  WELLBUTRIN SR Take 150 mg by mouth 2 (two) times daily.   furosemide 20 MG tablet Commonly known as:  LASIX Take 20 mg by mouth daily.   gabapentin 800 MG tablet Commonly known as:  NEURONTIN Take 800 mg by mouth 3 (three) times  daily.   isosorbide mononitrate 60 MG 24 hr tablet Commonly known as:  IMDUR Take 1 tablet (60 mg total) by mouth daily. Start taking on:  10/27/2017   lisinopril 20 MG tablet Commonly known as:  PRINIVIL,ZESTRIL Take 20 mg by mouth every morning.   nicotine 14 mg/24hr patch Commonly known as:  NICODERM CQ - dosed in mg/24 hours Place 1 patch (14 mg total) onto the skin daily.   nitroGLYCERIN 0.4 MG SL tablet Commonly known as:  NITROSTAT Place 1 tablet (0.4 mg total) under the tongue every 5 (five) minutes x 3 doses as needed for chest pain.   oxyCODONE-acetaminophen 7.5-325 MG tablet Commonly known as:  PERCOCET Take 1 tablet by mouth 4 (four) times daily as needed for moderate pain. What changed:  Another medication with the same name was removed. Continue taking this medication, and follow the directions you see here.   sertraline 100 MG tablet Commonly known as:  ZOLOFT Take 100 mg by mouth every morning.      Follow-up Information    Milford Cage, PA Follow up.   Specialty:  Physician Assistant Contact information: [phone]  [place] [address] [phone]        Adrian Prows, MD Follow up on 11/08/2017.   Specialty:  Cardiology Why:  Keep previously scheduled appointment Contact information: [address] [place] [address] [phone]          Adrian Prows, MD 10/26/2017, 11:10 AM  Pager: [phone] Office: [phone] If no answer: [phone]

## 2017-10-26 NOTE — Progress Notes (Signed)
  Echocardiogram 2D Echocardiogram has been performed.  Jennette Dubin 10/26/2017, 8:49 AM

## 2017-10-26 NOTE — Plan of Care (Signed)
  Progressing Safety: Ability to remain free from injury will improve 10/26/2017 0622 - Progressing by Ardine Eng, RN

## 2017-10-26 NOTE — Progress Notes (Signed)
Reviewed discharge instructions/AVS with patient and patient's daughter.  Answered their questions. Pt is stable and ready for discharge.

## 2018-01-29 DIAGNOSIS — E042 Nontoxic multinodular goiter: Secondary | ICD-10-CM | POA: Insufficient documentation

## 2018-01-29 DIAGNOSIS — J45909 Unspecified asthma, uncomplicated: Secondary | ICD-10-CM | POA: Insufficient documentation

## 2018-01-29 DIAGNOSIS — Q600 Renal agenesis, unilateral: Secondary | ICD-10-CM | POA: Insufficient documentation

## 2018-01-29 DIAGNOSIS — M5430 Sciatica, unspecified side: Secondary | ICD-10-CM | POA: Insufficient documentation

## 2018-01-29 DIAGNOSIS — IMO0002 Reserved for concepts with insufficient information to code with codable children: Secondary | ICD-10-CM | POA: Insufficient documentation

## 2018-01-29 DIAGNOSIS — M5134 Other intervertebral disc degeneration, thoracic region: Secondary | ICD-10-CM | POA: Insufficient documentation

## 2018-01-29 DIAGNOSIS — M5124 Other intervertebral disc displacement, thoracic region: Secondary | ICD-10-CM | POA: Insufficient documentation

## 2018-02-05 DIAGNOSIS — M5441 Lumbago with sciatica, right side: Secondary | ICD-10-CM

## 2018-02-05 DIAGNOSIS — K2270 Barrett's esophagus without dysplasia: Secondary | ICD-10-CM | POA: Insufficient documentation

## 2018-02-05 DIAGNOSIS — K635 Polyp of colon: Secondary | ICD-10-CM | POA: Insufficient documentation

## 2018-02-05 DIAGNOSIS — E559 Vitamin D deficiency, unspecified: Secondary | ICD-10-CM | POA: Insufficient documentation

## 2018-02-05 DIAGNOSIS — E7800 Pure hypercholesterolemia, unspecified: Secondary | ICD-10-CM | POA: Insufficient documentation

## 2018-02-05 DIAGNOSIS — G8929 Other chronic pain: Secondary | ICD-10-CM | POA: Insufficient documentation

## 2018-02-05 DIAGNOSIS — F32A Depression, unspecified: Secondary | ICD-10-CM | POA: Insufficient documentation

## 2018-02-05 DIAGNOSIS — M5442 Lumbago with sciatica, left side: Secondary | ICD-10-CM

## 2018-02-05 HISTORY — DX: Depression, unspecified: F32.A

## 2018-02-11 ENCOUNTER — Encounter (HOSPITAL_COMMUNITY): Payer: Self-pay | Admitting: Emergency Medicine

## 2018-02-11 ENCOUNTER — Emergency Department (HOSPITAL_COMMUNITY): Payer: Medicaid Other

## 2018-02-11 ENCOUNTER — Emergency Department (HOSPITAL_COMMUNITY)
Admission: EM | Admit: 2018-02-11 | Discharge: 2018-02-11 | Disposition: A | Discharge status: Home / Self Care | Payer: Medicaid Other | Attending: Emergency Medicine | Admitting: Emergency Medicine

## 2018-02-11 ENCOUNTER — Other Ambulatory Visit: Payer: Self-pay

## 2018-02-11 DIAGNOSIS — R110 Nausea: Secondary | ICD-10-CM | POA: Diagnosis not present

## 2018-02-11 DIAGNOSIS — I10 Essential (primary) hypertension: Secondary | ICD-10-CM | POA: Diagnosis not present

## 2018-02-11 DIAGNOSIS — R0602 Shortness of breath: Secondary | ICD-10-CM | POA: Diagnosis not present

## 2018-02-11 DIAGNOSIS — R0789 Other chest pain: Secondary | ICD-10-CM

## 2018-02-11 DIAGNOSIS — I252 Old myocardial infarction: Secondary | ICD-10-CM | POA: Insufficient documentation

## 2018-02-11 DIAGNOSIS — E039 Hypothyroidism, unspecified: Secondary | ICD-10-CM | POA: Diagnosis not present

## 2018-02-11 DIAGNOSIS — Z79899 Other long term (current) drug therapy: Secondary | ICD-10-CM | POA: Insufficient documentation

## 2018-02-11 DIAGNOSIS — F17210 Nicotine dependence, cigarettes, uncomplicated: Secondary | ICD-10-CM | POA: Insufficient documentation

## 2018-02-11 LAB — BASIC METABOLIC PANEL
Anion gap: 10 (ref 5–15)
BUN: 13 mg/dL (ref 6–20)
CO2: 25 mmol/L (ref 22–32)
Calcium: 9.4 mg/dL (ref 8.9–10.3)
Chloride: 104 mmol/L (ref 101–111)
Creatinine, Ser: 0.96 mg/dL (ref 0.44–1.00)
GFR calc Af Amer: >60 mL/min (ref >60)
Glucose, Bld: 115 mg/dL — ABNORMAL HIGH (ref 65–99)
POTASSIUM: 3.7 mmol/L (ref 3.5–5.1)
Sodium: 139 mmol/L (ref 135–145)

## 2018-02-11 LAB — T4, FREE: FREE T4: 0.73 ng/dL — AB (ref 0.82–1.77)

## 2018-02-11 LAB — I-STAT TROPONIN, ED
Troponin i, poc: 0 ng/mL (ref 0.00–0.08)
Troponin i, poc: 0.01 ng/mL (ref 0.00–0.08)

## 2018-02-11 LAB — CBC
HEMATOCRIT: 43.2 % (ref 36.0–46.0)
Hemoglobin: 14 g/dL (ref 12.0–15.0)
MCH: 30.8 pg (ref 26.0–34.0)
MCHC: 32.4 g/dL (ref 30.0–36.0)
MCV: 94.9 fL (ref 78.0–100.0)
Platelets: 284 10*3/uL (ref 150–400)
RBC: 4.55 MIL/uL (ref 3.87–5.11)
RDW: 13.5 % (ref 11.5–15.5)
WBC: 9.1 10*3/uL (ref 4.0–10.5)

## 2018-02-11 LAB — TSH: TSH: 0.802 u[IU]/mL (ref 0.350–4.500)

## 2018-02-11 MED ORDER — ONDANSETRON HCL 4 MG/2ML IJ SOLN
4.0000 mg | Freq: Once | INTRAMUSCULAR | Status: AC
  Administered 2018-02-11: 4 mg via INTRAVENOUS
  Filled 2018-02-11: qty 2

## 2018-02-11 MED ORDER — HYDROCODONE-ACETAMINOPHEN 5-325 MG PO TABS
2.0000 | ORAL_TABLET | Freq: Once | ORAL | Status: AC
  Administered 2018-02-11: 2 via ORAL
  Filled 2018-02-11: qty 2

## 2018-02-11 MED ORDER — DIPHENHYDRAMINE HCL 50 MG/ML IJ SOLN
25.0000 mg | Freq: Once | INTRAMUSCULAR | Status: AC
  Administered 2018-02-11: 25 mg via INTRAVENOUS
  Filled 2018-02-11: qty 1

## 2018-02-11 NOTE — ED Provider Notes (Signed)
[facility] EMERGENCY DEPARTMENT Provider Note   CSN: [id number] Arrival date & time: 02/11/18  1305     History   Chief Complaint Chief Complaint  Patient presents with  . Chest Pain    HPI Amanda Cardenas is a 52 y.o. female.  HPI Patient is a 52 year old female with a past medical history of coronary artery disease and hypertension who comes in today complaining of left-sided chest pain.  Patient states she also has history of hyperthyroidism for which she has been prescribed methimazole this past Friday but has not begun taking it.  Patient also states she was diagnosed with hypertension and told to take metoprolol but has also not taken this medication since being prescribed.  Patient states that last night she had intermittent left-sided chest pain which she describes as an ache with some mild associated SOB.  Patient states that it was made worse by lying down and not alleviated by anything.  Patient states she took multiple doses of nitro without relief.  Patient denies any recent illness, fevers, chills.  Endorses nausea without vomiting.  Past Medical History:  Diagnosis Date  . Arthritis   . 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 Baker cyst   . Gout   . Hypertension     Patient Active Problem List   Diagnosis Date Noted  . NSTEMI (non-ST elevated myocardial infarction) ([place]) 10/25/2017  . Chronic venous insufficiency 05/16/2017  . Varicose veins of left lower extremity with complications 01/60/1093    Past Surgical History:  Procedure Laterality Date  . ENDOVENOUS ABLATION SAPHENOUS VEIN W/ LASER Left 09/27/2017   endovenous laser ablation left greater saphenous vein and stab phlebectomy > 20 incisions left leg by Tinnie Gens MD  . [place]  2000  . LEFT HEART CATH AND CORONARY ANGIOGRAPHY N/A 10/25/2017   Procedure: LEFT HEART CATH AND CORONARY ANGIOGRAPHY;  Surgeon:  Adrian Prows, MD;  Location: [facility];  Service: Cardiovascular;  Laterality: N/A;     OB History   None      Home Medications    Prior to Admission medications   Medication Sig Start Date End Date Taking? Authorizing Provider  amLODipine (NORVASC) 5 MG tablet Take 1 tablet (5 mg total) by mouth daily after supper. Patient taking differently: Take 10 mg by mouth daily after supper.  10/26/17  Yes Adrian Prows, MD  amphetamine-dextroamphetamine (ADDERALL) 15 MG tablet Take 15 mg by mouth 2 (two) times daily.  04/03/17  Yes [provider]  atorvastatin (LIPITOR) 10 MG tablet Take 1 tablet (10 mg total) by mouth daily. 10/26/17 10/26/18 Yes Adrian Prows, MD  buPROPion [facility] SR) 150 MG 12 hr tablet Take 150 mg by mouth 2 (two) times daily.   Yes [provider]  gabapentin (NEURONTIN) 800 MG tablet Take 800 mg by mouth 3 (three) times daily.   Yes [provider]  hydrochlorothiazide (HYDRODIURIL) 25 MG tablet Take 25 mg by mouth daily. 12/17/17  Yes [provider]  isosorbide mononitrate (IMDUR) 60 MG 24 hr tablet Take 1 tablet (60 mg total) by mouth daily. 10/27/17  Yes Adrian Prows, MD  methimazole (TAPAZOLE) 5 MG tablet Take 5 mg by mouth daily. 02/06/18  Yes [provider]  metoprolol tartrate (LOPRESSOR) 25 MG tablet Take 25 mg by mouth 2 (two) times daily.   Yes [provider]  nitroGLYCERIN (NITROSTAT) 0.4 MG SL tablet Place 1 tablet (  0.4 mg total) under the tongue every 5 (five) minutes x 3 doses as needed for chest pain. 10/26/17  Yes Adrian Prows, MD  Oxycodone HCl 10 MG TABS Take 10 mg by mouth 4 (four) times daily as needed for pain. 01/22/18  Yes [provider]  sertraline (ZOLOFT) 100 MG tablet Take 150 mg by mouth every morning.    Yes [provider]  nicotine (NICODERM CQ - DOSED IN MG/24 HOURS) 14 mg/24hr patch Place 1 patch (14 mg total) onto the skin daily. 10/26/17 12/25/17  Adrian Prows, MD    Family  History Family History  Family history unknown: Yes    Social History Social History   Tobacco Use  . Smoking status: Current Some Day Smoker    Packs/day: 0.25    Years: 25.00    Pack years: 6.25    Types: Cigarettes  . Smokeless tobacco: Never Used  Substance Use Topics  . Alcohol use: No  . Drug use: No     Allergies   Zofran [ondansetron hcl] and Prednisone   Review of Systems Review of Systems  Constitutional: Negative for fatigue and fever.  Respiratory: Positive for shortness of breath.   Cardiovascular: Positive for chest pain. Negative for palpitations and leg swelling.  Gastrointestinal: Positive for nausea. Negative for vomiting.  All other systems reviewed and are negative.    Physical Exam Updated Vital Signs BP (!) 164/104   Pulse (!) 58   Temp 98.6 F (37 C) (Oral)   Resp 18   LMP 10/18/2011   SpO2 97%   Physical Exam  Constitutional: She appears well-developed and well-nourished. No distress.  HENT:  Head: Normocephalic and atraumatic.  Eyes: Conjunctivae are normal.  Neck: Neck supple.  Cardiovascular: Normal rate, regular rhythm and normal pulses.  No murmur heard. Pulmonary/Chest: Effort normal and breath sounds normal. No respiratory distress.  Abdominal: Soft. There is no tenderness.  Musculoskeletal: She exhibits no edema.       Right lower leg: She exhibits no tenderness and no edema.       Left lower leg: She exhibits no tenderness and no edema.  Neurological: She is alert.  Skin: Skin is warm and dry.  Psychiatric: She has a normal mood and affect.  Nursing note and vitals reviewed.    ED Treatments / Results  Labs (all labs ordered are listed, but only abnormal results are displayed) Labs Reviewed  BASIC METABOLIC PANEL - Abnormal; Notable for the following components:      Result Value   Glucose, Bld 115 (*)    All other components within normal limits  T4, FREE - Abnormal; Notable for the following components:    Free T4 0.73 (*)    All other components within normal limits  CBC  TSH  I-STAT TROPONIN, ED  I-STAT TROPONIN, ED    EKG EKG Interpretation  Date/Time:  Monday Feb 11 2018 13:16:28 EDT Ventricular Rate:  72 PR Interval:  164 QRS Duration: 84 QT Interval:  394 QTC Calculation: 431 R Axis:   -16 Text Interpretation:  Normal sinus rhythm Possible Left atrial enlargement Borderline ECG Confirmed by Davonna Belling [phone]) on 02/11/2018 8:41:48 PM   Radiology Dg Chest 2 View  Result Date: 02/11/2018 CLINICAL DATA:  Intermittent left-sided chest pain. EXAM: CHEST - 2 VIEW COMPARISON:  Chest x-ray dated October 25, 2017. FINDINGS: Normal heart size. Convex contour of the aortopulmonary window corresponds to the pericardial cyst seen on prior CT. No focal consolidation, pleural effusion,  or pneumothorax. No acute osseous abnormality. Unchanged large hiatal hernia. IMPRESSION: No active cardiopulmonary disease. Electronically Signed   By: Titus Dubin M.D.   On: 02/11/2018 14:14    Procedures Procedures (including critical care time)  Medications Ordered in ED Medications  HYDROcodone-acetaminophen (NORCO/VICODIN) 5-325 MG per tablet 2 tablet (2 tablets Oral Given 02/11/18 2216)  ondansetron (ZOFRAN) injection 4 mg (4 mg Intravenous Given 02/11/18 2216)  diphenhydrAMINE (BENADRYL) injection 25 mg (25 mg Intravenous Given 02/11/18 2226)     Initial Impression / Assessment and Plan / ED Course  I have reviewed the triage vital signs and the nursing notes.  Pertinent labs & imaging results that were available during my care of the patient were reviewed by me and considered in my medical decision making (see chart for details).    Patient was hypertensive on initial evaluation and instructed to take her home metoprolol which improved patient's blood pressure.  Patient with normal O2 sat and heart rate.  Patient clear to auscultation bilaterally without abnormality on cardiac or  abdominal exams.  Laboratory work-up largely within normal limits without concerning findings.  Patient with negative troponin x2.  Patient given dose of Zofran for nausea but had an allergic reaction with hives on the right forearm which is the location of the IV used to administer the medication.  Patient given dose of Benadryl with resolution of symptoms.  Pt given appropriate f/u and return precautions. Pt voiced understanding and is agreeable to discharge at this time.     Final Clinical Impressions(s) / ED Diagnoses   Final diagnoses:  Hypertension, unspecified type  Atypical chest pain    ED Discharge Orders    None       Chapman Moss, MD 02/11/18 8325    Davonna Belling, MD 02/11/18 2352

## 2018-02-11 NOTE — ED Triage Notes (Signed)
Pt states left sided chest pain that comes and goes, none at present that started last night before bed. Took 6 nitro throughout the night. Pt states during the episodes she was having shortness of breath. None at present.

## 2018-03-05 ENCOUNTER — Encounter: Payer: Self-pay | Admitting: *Deleted

## 2018-03-05 ENCOUNTER — Other Ambulatory Visit: Payer: Self-pay | Admitting: *Deleted

## 2018-03-05 ENCOUNTER — Other Ambulatory Visit: Payer: Self-pay

## 2018-03-05 ENCOUNTER — Institutional Professional Consult (permissible substitution): Payer: Medicaid Other | Admitting: Thoracic Surgery (Cardiothoracic Vascular Surgery)

## 2018-03-05 VITALS — BP 112/81 | HR 66 | Resp 18 | Ht 62.0 in | Wt 170.0 lb

## 2018-03-05 DIAGNOSIS — K219 Gastro-esophageal reflux disease without esophagitis: Secondary | ICD-10-CM | POA: Insufficient documentation

## 2018-03-05 DIAGNOSIS — J9859 Other diseases of mediastinum, not elsewhere classified: Secondary | ICD-10-CM

## 2018-03-05 DIAGNOSIS — G43909 Migraine, unspecified, not intractable, without status migrainosus: Secondary | ICD-10-CM | POA: Insufficient documentation

## 2018-03-05 NOTE — Progress Notes (Signed)
PCP is Milford Cage, PA Referring Provider is Milford Cage, PA  Chief Complaint  Patient presents with  . Mediastinal Mass    new patient, CT Chest 02/10/2018    HPI: Ms. Amanda Cardenas is sent for consultation regarding an anterior mediastinal mass  Laiba Fuerte is a 52 year old woman with a history significant for hypertension, hiatal hernia, reflux, Barrett's esophagus, chronic pain, arthritis, multinodular goiter, migraines, and gout.  She had a non-ST elevation MI back in January 2019.  At catheterization she had angiographically normal coronaries.  She has a history of tobacco abuse (1 pack/day for 25 years, currently 1/4 pack/day).   She recently was seen in follow-up for her thyroid problems.  She had a chest x-ray which showed a prominent left hilum concerning for lymphadenopathy.  A CT of the chest showed a 4.5 x 3.5 x 6.4 centimeter left sided anterior mediastinal mass.  Other notable findings were a multinodular goiter.  There were no lung nodules.  There was a 3.8 cm left adrenal mass.  A hiatal hernia was present.  There also was a small nodule in the liver 15 mm.  That was too small to characterize.  She presented with chest pain back in January and ruled in for a non-ST elevation MI with a minimal increase in her troponin.  She continues to have chest pain.  She says she usually notices this when she is lying down it tends to be a sharp pain under the left breast that catches when she sits up.  It is of very short duration.  She gets short of breath with exertion and also when lying flat.  She says she can walk up a flight of stairs.  She is unsure how far she can walk on level ground.  Her weight fluctuates.  She does complain of lack of energy and loss of appetite.  She has chronic pain related to her back.  She has numbness in her feet.  Past Medical History:  Diagnosis Date  . Abnormal thyroid ultrasound 02/08/2017   multinodular goiter  . Acid reflux   . Arthritis   .  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 Baker cyst   . Barrett's esophagus   . Colon polyps   . Gout   . H/O colonoscopy 08/2016  . Hx of cardiac cath 10/25/2017   NO INTERVENTION  . Hypertension   . Migraines   . Vitamin D deficiency     Past Surgical History:  Procedure Laterality Date  . ABDOMINAL HYSTERECTOMY    . APPENDECTOMY    . carpal tunnel repair    . ENDOVENOUS ABLATION SAPHENOUS VEIN W/ LASER Left 09/27/2017   endovenous laser ablation left greater saphenous vein and stab phlebectomy > 20 incisions left leg by Tinnie Gens MD  . [place]  2000  . LEFT HEART CATH AND CORONARY ANGIOGRAPHY N/A 10/25/2017   Procedure: LEFT HEART CATH AND CORONARY ANGIOGRAPHY;  Surgeon: Adrian Prows, MD;  Location: [facility];  Service: Cardiovascular;  Laterality: N/A;  . TONSILLECTOMY      Family History  Problem Relation Age of Onset  . Hypertension Mother   . Hyperlipidemia Mother   . Diabetes Mellitus II Mother   . Heart disease Mother   . Depression Mother     Social History Social History   Tobacco Use  . Smoking status: Current Some Day Smoker    Packs/day: 0.25  Years: 25.00    Pack years: 6.25    Types: Cigarettes  . Smokeless tobacco: Never Used  . Tobacco comment: trying to quit  Substance Use Topics  . Alcohol use: No  . Drug use: No    Current Outpatient Medications  Medication Sig Dispense Refill  . albuterol (PROVENTIL HFA;VENTOLIN HFA) 108 (90 Base) MCG/ACT inhaler Inhale into the lungs every 6 (six) hours as needed for wheezing or shortness of breath.    Marland Kitchen amLODipine (NORVASC) 5 MG tablet Take 1 tablet (5 mg total) by mouth daily after supper. (Patient taking differently: Take 10 mg by mouth daily after supper. ) 30 tablet 1  . amphetamine-dextroamphetamine (ADDERALL) 30 MG tablet Take 30 mg by mouth daily. TO A CLEAN DRY FACE    . atorvastatin (LIPITOR) 10 MG tablet Take  1 tablet (10 mg total) by mouth daily. 30 tablet 1  . buPROPion (WELLBUTRIN SR) 150 MG 12 hr tablet Take 150 mg by mouth daily.     . furosemide (LASIX) 20 MG tablet Take 20 mg by mouth daily.    Marland Kitchen gabapentin (NEURONTIN) 800 MG tablet Take 800 mg by mouth 3 (three) times daily.    . hydrochlorothiazide (HYDRODIURIL) 25 MG tablet Take 25 mg by mouth daily.  2  . isosorbide mononitrate (IMDUR) 60 MG 24 hr tablet Take 1 tablet (60 mg total) by mouth daily. 30 tablet 1  . methimazole (TAPAZOLE) 5 MG tablet Take 5 mg by mouth daily.    . metoprolol tartrate (LOPRESSOR) 50 MG tablet Take 50 mg by mouth daily. HASN'T STARTED TAKING YET    . mometasone (ELOCON) 0.1 % ointment Apply 1 application topically daily as needed.    . nitroGLYCERIN (NITROSTAT) 0.4 MG SL tablet Place 1 tablet (0.4 mg total) under the tongue every 5 (five) minutes x 3 doses as needed for chest pain. 25 tablet 1  . Oxycodone HCl 10 MG TABS Take 10 mg by mouth 4 (four) times daily as needed for pain.  0  . sertraline (ZOLOFT) 100 MG tablet Take 150 mg by mouth every morning.     . Tretinoin 0.05 % LOTN Apply 1 application topically at bedtime. TO    . nicotine (NICODERM CQ - DOSED IN MG/24 HOURS) 14 mg/24hr patch Place 1 patch (14 mg total) onto the skin daily. 30 patch 1   No current facility-administered medications for this visit.     Allergies  Allergen Reactions  . Zofran [Ondansetron Hcl] Hives  . Tramadol Nausea Only    *ANALGESICS-OPIOIDS*  . Prednisone Other (See Comments)    Pt reports hyperactivity, "makes me crazy"    Review of Systems  Constitutional: Positive for activity change, appetite change, fatigue and unexpected weight change (Gained 9 pounds in 3 months).  HENT: Positive for hearing loss and trouble swallowing. Negative for voice change.   Eyes: Positive for visual disturbance.  Respiratory: Positive for shortness of breath. Negative for cough and wheezing.   Cardiovascular: Positive for chest pain,  palpitations and leg swelling.  Gastrointestinal: Positive for abdominal pain (Frequent heartburn), constipation and diarrhea.  Endocrine: Negative for polydipsia and polyphagia.  Genitourinary: Positive for hematuria. Negative for dysuria.  Musculoskeletal: Positive for arthralgias, back pain, gait problem and myalgias.  Skin: Positive for rash.       Itching  Neurological: Positive for numbness and headaches. Negative for syncope and speech difficulty.       Chronic pain, memory problems  Psychiatric/Behavioral: Positive for dysphoric mood. The  patient is nervous/anxious.   All other systems reviewed and are negative.   BP 112/81 (BP Location: Left Arm, Patient Position: Sitting, Cuff Size: Normal)   Pulse 66   Resp 18   Ht 5\' 2"  (1.575 m)   Wt 170 lb (77.1 kg)   LMP 10/18/2011   SpO2 98% Comment: RA  BMI 31.09 kg/m  Physical Exam  Constitutional: She is oriented to person, place, and time.  Obese  HENT:  Head: Normocephalic and atraumatic.  Mouth/Throat: No oropharyngeal exudate.  Eyes: Conjunctivae and EOM are normal. No scleral icterus.  Neck: Thyromegaly present.  Cardiovascular: Normal rate, regular rhythm and normal heart sounds. Exam reveals no gallop and no friction rub.  No murmur heard. Pulmonary/Chest: Effort normal and breath sounds normal. No stridor. She has no wheezes.  Abdominal: Soft. She exhibits no distension. There is no tenderness.  Musculoskeletal: She exhibits no edema.  Lymphadenopathy:    She has no cervical adenopathy.  Neurological: She is alert and oriented to person, place, and time. No cranial nerve deficit. She exhibits normal muscle tone. Coordination normal.  Skin: Skin is warm and dry.  Varicose veins both lower extremities  Vitals reviewed.  Diagnostic Tests: I personally reviewed the CT images that were done at [facility] in May 2019.  I agree with the impression from the official reading of a 45 mm x 35 mm x 64 mm left  anterior mediastinal mass.  There was no hilar or mediastinal lymphadenopathy.  Multinodular goiter.  Left adrenal gland mass measuring 38 mm in its greatest dimension.  Hiatal hernia.  Low-density lesion in the right lobe of the liver measuring 15 mm.  Impression: Ms. Hipps is a 52 year old woman with a past history of a non-ST elevation MI, hypertension, tobacco abuse, multinodular goiter, hiatal hernia with recurrence after repair, reflux, arthritis, and chronic pain.  A CT of the chest shows a 6.4 x 4.5 x 3.4 cm anterior mediastinal mass.  This has relatively smooth borders with no apparent invasion of surrounding structures.  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  Lymphoma, ectopic thyroid, and germ cell tumors are also in the differential diagnosis.  We will check alpha-fetoprotein and beta-hCG levels.  I suspect those will be normal.  She needs a PET/CT to further evaluate and guide the initial diagnostic work-up of the mediastinal mass as well as the left adrenal abnormality and possibly the liver lesion as well.  The liver lesion might be too small for resolution.  If there are no surprising findings on the PET CT the next step would be resection.  I described the proposed procedure of median sternotomy for thymectomy with Ms. Vien.  I informed her of the general nature of the procedure, the need for general anesthesia, use of drainage tubes postoperatively, the expected hospital stay, and the overall recovery.  We discussed the indications, risks, benefits, and alternatives.  She understands the risks include, but not limited to death, MI, DVT, PE, bleeding, possible need for transfusion, infection, potential need for resection of surrounding structures if invasion is present, phrenic nerve injury, as well as the possibility of other reversible complications.  She accepts the risks and wishes to proceed.  Non-ST elevation MI.  Current symptoms not consistent with angina.  Normal coronaries  angiographically.  At risk for MI perioperatively but not unusually elevated.  Thyroid disease-multinodular goiter.  TSH normal in May.  Free T4 level slightly low.  Plan: Alpha-fetoprotein and beta hCG PET/CT to guide initial  diagnostic work-up of anterior mediastinal mass Median sternotomy for thymectomy on Monday, 03/18/2018  [name], MD [facility] [phone]

## 2018-03-05 NOTE — Progress Notes (Unsigned)
Beta HCG

## 2018-03-06 LAB — AFP TUMOR MARKER: AFP-Tumor Marker: 1.3 ng/mL

## 2018-03-13 ENCOUNTER — Encounter (HOSPITAL_COMMUNITY): Payer: Self-pay | Admitting: Radiology

## 2018-03-13 ENCOUNTER — Ambulatory Visit (HOSPITAL_COMMUNITY)
Admission: RE | Admit: 2018-03-13 | Discharge: 2018-03-13 | Disposition: A | Discharge status: Home / Self Care | Payer: Medicaid Other | Source: Ambulatory Visit | Attending: Thoracic Surgery (Cardiothoracic Vascular Surgery) | Admitting: Thoracic Surgery (Cardiothoracic Vascular Surgery)

## 2018-03-13 DIAGNOSIS — M899 Disorder of bone, unspecified: Secondary | ICD-10-CM | POA: Diagnosis not present

## 2018-03-13 DIAGNOSIS — J9859 Other diseases of mediastinum, not elsewhere classified: Secondary | ICD-10-CM | POA: Insufficient documentation

## 2018-03-13 LAB — GLUCOSE, CAPILLARY: Glucose-Capillary: 104 mg/dL — ABNORMAL HIGH (ref 65–99)

## 2018-03-13 MED ORDER — FLUDEOXYGLUCOSE F - 18 (FDG) INJECTION
8.5000 | Freq: Once | INTRAVENOUS | Status: AC | PRN
Start: 2018-03-13 — End: 2018-03-13
  Administered 2018-03-13: 8.5 via INTRAVENOUS

## 2018-03-13 NOTE — Pre-Procedure Instructions (Signed)
Amanda Cardenas  03/13/2018      [id number] [address], [place] [address] Phone: [phone] Fax: [phone]    Your procedure is scheduled on March 18, 2018.  Report to [facility] Admitting at 530 AM.  Call this number if you have problems the morning of surgery:  [phone]   Remember:  Do not eat or drink after midnight.  Continue all medications as directed by your physician except follow these medication instructions before surgery below    Take these medicines the morning of surgery with A SIP OF WATER  Metoprolol (lopressor) Albuterol inhaler-if needed (bring inhaler with you) Amlodipine (norvasc) buproprion (wellbutrin) Eye drops-if needed Gabapentin (neurontin) Methimazole (tapazole) Oxycodone-if needed for pain Nitrostat-if needed for chest pain Sertraline (zoloft)  Follow your surgeon's instructions on when to hold/resume aspirin.  If no instructions were given call the office to determine how they would like to you take aspirin  7 days prior to surgery STOP taking any Aleve, Naproxen, Ibuprofen, Motrin, Advil, Goody's, BC's, all herbal medications, fish oil, and all vitamins    Do not wear jewelry, make-up or nail polish.  Do not wear lotions, powders, or perfumes, or deodorant.  Do not shave 48 hours prior to surgery.    Do not bring valuables to the hospital.  [facility] is not responsible for any belongings or valuables.  Contacts, dentures or bridgework may not be worn into surgery.  Leave your suitcase in the car.  After surgery it may be brought to your room.  For patients admitted to the hospital, discharge time will be determined by your treatment team.  Patients discharged the day of surgery will not be allowed to drive home.   [place]- Preparing For Surgery  Before surgery, you can play an important role. Because skin is not sterile, your skin needs to be as  free of germs as possible. You can reduce the number of germs on your skin by washing with CHG (chlorahexidine gluconate) Soap before surgery.  CHG is an antiseptic cleaner which kills germs and bonds with the skin to continue killing germs even after washing.    Oral Hygiene is also important to reduce your risk of infection.  Remember - BRUSH YOUR TEETH THE MORNING OF SURGERY WITH YOUR REGULAR TOOTHPASTE  Please do not use if you have an allergy to CHG or antibacterial soaps. If your skin becomes reddened/irritated stop using the CHG.  Do not shave (including legs and underarms) for at least 48 hours prior to first CHG shower. It is OK to shave your face.  Please follow these instructions carefully.   1. Shower the NIGHT BEFORE SURGERY and the MORNING OF SURGERY with CHG.   2. If you chose to wash your hair, wash your hair first as usual with your normal shampoo.  3. After you shampoo, rinse your hair and body thoroughly to remove the shampoo.  4. Use CHG as you would any other liquid soap. You can apply CHG directly to the skin and wash gently with a scrungie or a clean washcloth.   5. Apply the CHG Soap to your body ONLY FROM THE NECK DOWN.  Do not use on open wounds or open sores. Avoid contact with your eyes, ears, mouth and genitals (private parts). Wash Face and genitals (private parts)  with your normal soap.  6. Wash thoroughly, paying special attention to the area where your  surgery will be performed.  7. Thoroughly rinse your body with warm water from the neck down.  8. DO NOT shower/wash with your normal soap after using and rinsing off the CHG Soap.  9. Pat yourself dry with a CLEAN TOWEL.  10. Wear CLEAN PAJAMAS to bed the night before surgery, wear comfortable clothes the morning of surgery  11. Place CLEAN SHEETS on your bed the night of your first shower and DO NOT SLEEP WITH PETS.  Day of Surgery:  Do not apply any deodorants/lotions.  Please wear clean clothes to  the hospital/surgery center.   Remember to brush your teeth WITH YOUR REGULAR TOOTHPASTE.   Please read over the following fact sheets that you were given. Pain Booklet, Coughing and Deep Breathing, MRSA Information and Surgical Site Infection Prevention

## 2018-03-14 ENCOUNTER — Encounter (HOSPITAL_COMMUNITY): Payer: Self-pay

## 2018-03-14 ENCOUNTER — Other Ambulatory Visit: Payer: Self-pay

## 2018-03-14 ENCOUNTER — Encounter (HOSPITAL_COMMUNITY)
Admission: RE | Admit: 2018-03-14 | Discharge: 2018-03-14 | Disposition: A | Discharge status: Home / Self Care | Payer: Medicaid Other | Source: Ambulatory Visit | Attending: Thoracic Surgery (Cardiothoracic Vascular Surgery) | Admitting: Thoracic Surgery (Cardiothoracic Vascular Surgery)

## 2018-03-14 ENCOUNTER — Telehealth: Payer: Self-pay | Admitting: Thoracic Surgery (Cardiothoracic Vascular Surgery)

## 2018-03-14 DIAGNOSIS — Z01810 Encounter for preprocedural cardiovascular examination: Secondary | ICD-10-CM | POA: Diagnosis present

## 2018-03-14 DIAGNOSIS — J9859 Other diseases of mediastinum, not elsewhere classified: Secondary | ICD-10-CM | POA: Insufficient documentation

## 2018-03-14 DIAGNOSIS — Z01812 Encounter for preprocedural laboratory examination: Secondary | ICD-10-CM | POA: Diagnosis present

## 2018-03-14 HISTORY — DX: Thyrotoxicosis, unspecified without thyrotoxic crisis or storm: E05.90

## 2018-03-14 LAB — URINALYSIS, ROUTINE W REFLEX MICROSCOPIC
Bilirubin Urine: NEGATIVE
Glucose, UA: NEGATIVE mg/dL
KETONES UR: NEGATIVE mg/dL
Leukocytes, UA: NEGATIVE
Nitrite: NEGATIVE
PROTEIN: NEGATIVE mg/dL
Specific Gravity, Urine: 1.011 (ref 1.005–1.030)
pH: 7 (ref 5.0–8.0)

## 2018-03-14 LAB — COMPREHENSIVE METABOLIC PANEL
ALBUMIN: 3.6 g/dL (ref 3.5–5.0)
ALT: 17 U/L (ref 14–54)
ANION GAP: 9 (ref 5–15)
AST: 16 U/L (ref 15–41)
Alkaline Phosphatase: 64 U/L (ref 38–126)
BILIRUBIN TOTAL: 0.5 mg/dL (ref 0.3–1.2)
BUN: 13 mg/dL (ref 6–20)
CO2: 24 mmol/L (ref 22–32)
Calcium: 8.8 mg/dL — ABNORMAL LOW (ref 8.9–10.3)
Chloride: 107 mmol/L (ref 101–111)
Creatinine, Ser: 1 mg/dL (ref 0.44–1.00)
GFR calc non Af Amer: >60 mL/min (ref >60)
GLUCOSE: 114 mg/dL — AB (ref 65–99)
POTASSIUM: 4.1 mmol/L (ref 3.5–5.1)
SODIUM: 140 mmol/L (ref 135–145)
Total Protein: 6.2 g/dL — ABNORMAL LOW (ref 6.5–8.1)

## 2018-03-14 LAB — CBC
HEMATOCRIT: 39.4 % (ref 36.0–46.0)
Hemoglobin: 12.7 g/dL (ref 12.0–15.0)
MCH: 30 pg (ref 26.0–34.0)
MCHC: 32.2 g/dL (ref 30.0–36.0)
MCV: 92.9 fL (ref 78.0–100.0)
Platelets: 254 10*3/uL (ref 150–400)
RBC: 4.24 MIL/uL (ref 3.87–5.11)
RDW: 13.2 % (ref 11.5–15.5)
WBC: 9.2 10*3/uL (ref 4.0–10.5)

## 2018-03-14 LAB — BLOOD GAS, ARTERIAL
Acid-Base Excess: 1.9 mmol/L (ref 0.0–2.0)
BICARBONATE: 25.7 mmol/L (ref 20.0–28.0)
Drawn by: 449841
FIO2: 21
O2 SAT: 94.3 %
PO2 ART: 71.3 mmHg — AB (ref 83.0–108.0)
Patient temperature: 98.6
pCO2 arterial: 38.8 mmHg (ref 32.0–48.0)
pH, Arterial: 7.436 (ref 7.350–7.450)

## 2018-03-14 LAB — SURGICAL PCR SCREEN
MRSA, PCR: POSITIVE — AB
Staphylococcus aureus: POSITIVE — AB

## 2018-03-14 LAB — TYPE AND SCREEN
ABO/RH(D): B POS
ANTIBODY SCREEN: NEGATIVE

## 2018-03-14 LAB — PROTIME-INR
INR: 0.91
Prothrombin Time: 12.1 seconds (ref 11.4–15.2)

## 2018-03-14 LAB — APTT: aPTT: 30 seconds (ref 24–36)

## 2018-03-14 NOTE — Progress Notes (Signed)
Contacted by micobiology and informed patient positive for MRSA and STAPH.  Called in mupirocin prescription to patient pharmacy and left confidential voice message on pt cell phone-per patient request.  Orders entered for contact isolation status

## 2018-03-14 NOTE — Progress Notes (Addendum)
PCP: Docia Barrier, PA  Cardiologist: Adrian Prows, MD  EKG: obtained today per order  Stress test:  ECHO: 10/26/17 in [facility]  Cardiac Cath: 10/25/17  Chest x-ray: will need to be obtained day of surgery per order, (within 72 hours)

## 2018-03-14 NOTE — Telephone Encounter (Signed)
      [address]       [place] [address]             [phone]      I called Ms. Menger to discuss the PET results. The anterior mediastinal abnormality appears to be a simple cyst, it has no tracer uptake at all.  Will cancel her surgery as there is no meed for a sternotomy.  Will have her come back to the office next week to discuss options   NUCLEAR MEDICINE PET SKULL BASE TO THIGH  TECHNIQUE: 8.5 mCi F-18 FDG was injected intravenously. Full-ring PET imaging was performed from the skull base to thigh after the radiotracer. CT data was obtained and used for attenuation correction and anatomic localization.  Fasting blood glucose: 104 mg/dl  COMPARISON:  CT scan 12/24/2016  FINDINGS: Mediastinal blood pool activity: SUV max 1.81  NECK: No hypermetabolic lymph nodes in the neck.  Incidental CT findings: Multinodular thyroid goiter.  CHEST: 4.5 cm cyst is not hypermetabolic. It could be a pericardial cyst or possibly a thymic cyst. No mediastinal or hilar adenopathy. No supraclavicular or axillary adenopathy. Small scattered lymph nodes are stable.  No worrisome pulmonary nodules.  Incidental CT findings: Stable large hiatal hernia with paraesophageal component.  ABDOMEN/PELVIS: No abnormal hypermetabolic activity within the liver, pancreas, adrenal glands, or spleen. No hypermetabolic lymph nodes in the abdomen or pelvis.  Incidental CT findings: Stable benign left adrenal gland adenoma.  Severely chronically atrophied left kidney and compensatory hypertrophy of the right kidney.  SKELETON: No focal hypermetabolic activity to suggest skeletal metastasis.  Incidental CT findings: none  IMPRESSION: 1. 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 No hypermetabolism. No enlarged or hypermetabolic mediastinal or hilar lymph nodes. 2. No significant findings in the neck,  abdomen or pelvis.   Electronically Signed   By: Marijo Sanes M.D.   On: 03/13/2018 10:39 I personally reviewed the PET CT images and concur with the findings noted above  Remo Lipps C. Roxan Hockey, MD [facility] [phone]

## 2018-03-15 ENCOUNTER — Encounter (HOSPITAL_COMMUNITY): Payer: Self-pay | Admitting: Vascular Surgery

## 2018-03-15 ENCOUNTER — Other Ambulatory Visit: Payer: Self-pay | Admitting: *Deleted

## 2018-03-15 NOTE — Progress Notes (Signed)
Anesthesia Chart Review:  Case:  [id number] Date/Time:  03/18/18 0715   Procedures:      MEDIAN STERNOTOMY (N/A )     THYMECTOMY (N/A )   Anesthesia type:  General   Pre-op diagnosis:  ANTERIOR MEDIASTINAL MASS   Location:  [facility] OR ROOM 10 / [place] [facility]   Surgeon:  [name], MD      DISCUSSION: Patient is a 52 year old female scheduled for the above procedure. History includes smoking, HTN, NSTEMI (coronary spasm 10/25/17), multi-nodular goiter with hyperthyroidism (normal TSH 0.802, slightly decreased Free T4 0.73 02/11/18; not currently on Tapazole).   UPDATE: According to telephone encounter by Dr. Roxan Hockey from 03/14/18, "I called Ms. Kallenberger to discuss the PET results. The anterior mediastinal abnormality appears to be a simple cyst, it has no tracer uptake at all. Will cancel her surgery as there is no meed for a sternotomy. Will have her come back to the office next week to discuss options."   VS: BP 137/86   Pulse 66   Temp 36.8 C (Oral)   Resp 18   Ht 5\' 2"  (1.575 m)   Wt 171 lb 4.8 oz (77.7 kg)   LMP 10/18/2011 (LMP Unknown)   SpO2 97%   BMI 31.33 kg/m   PROVIDERS: Milford Cage, PA is listed as PCP. Adrian Prows, MD is cardiologist. According to 10/2017 discharge summary, he was planning to see her in follow-up, but if remained stable would see her back on a when necessary basis.   LABS: Labs reviewed: Acceptable for surgery. (all labs ordered are listed, but only abnormal results are displayed)  Labs Reviewed  SURGICAL PCR SCREEN - Abnormal; Notable for the following components:      Result Value   MRSA, PCR POSITIVE (*)    Staphylococcus aureus POSITIVE (*)    All other components within normal limits  URINALYSIS, ROUTINE W REFLEX MICROSCOPIC - Abnormal; Notable for the following components:   Hgb urine dipstick SMALL (*)    Bacteria, UA RARE (*)    All other components within normal limits  BLOOD GAS, ARTERIAL - Abnormal; Notable for the following  components:   pO2, Arterial 71.3 (*)    All other components within normal limits  COMPREHENSIVE METABOLIC PANEL - Abnormal; Notable for the following components:   Glucose, Bld 114 (*)    Calcium 8.8 (*)    Total Protein 6.2 (*)    All other components within normal limits  APTT  CBC  PROTIME-INR  TYPE AND SCREEN    IMAGES: PET scan 03/13/18: IMPRESSION: 1. 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 No hypermetabolism. No enlarged or hypermetabolic mediastinal or hilar lymph nodes. 2. No significant findings in the neck, abdomen or pelvis.  CT Chest 02/10/18 [facility]): IMPRESSION: Mediastinal mass. Rule out thymoma. Left adrenal gland mass. 15 mm low-density lesion in the liver too small to characterize.   CXR ordered to be within 72 hours of surgery.   EKG: 03/14/18: NSR   CV: Echo 10/26/17:  Study Conclusions - Left ventricle: The cavity size was normal. Systolic function was   normal. Wall motion was normal; there were no regional wall   motion abnormalities. - Left atrium: The atrium was mildly dilated. Impressions: - Normal pulmonary artery pressure.  [facility] 10/25/17 (Dr. Adrian Prows): Coronary angiogram 10/25/2017: Normal LV systolic function without wall motion abnormality. Left dominant circulation, tortuous coronary arteries but no significant disease  suggestive of hypertensive heart disease. Small nondominant RCA with anterior origin. Ascending aortogram reveals presence of 3 aortic valve cusps without aortic regurgitation, no dissection was evident, no aneurysm. Recommendation: Patient has positive cardiac markers with EKG abnormalities suggestive of non-STEMI. She may have had coronary spasm that could have lead to non-STEMI. I will observe the patient tonight and trend the serum troponin and will probably discharge in the morning. She was told to have normal LV function without significant structural  abnormality by outpatient echocardiogram which I do not have the results of.  Past Medical History:  Diagnosis Date  . Abnormal thyroid ultrasound 02/08/2017   multinodular goiter  . Acid reflux   . Arthritis   . 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 Baker cyst   . Barrett's esophagus   . Colon polyps   . Gout   . H/O colonoscopy 08/2016  . Hx of cardiac cath 10/25/2017   NO INTERVENTION  . Hypertension   . Hyperthyroidism   . Migraines   . Vitamin D deficiency     Past Surgical History:  Procedure Laterality Date  . ABDOMINAL HYSTERECTOMY    . APPENDECTOMY    . carpal tunnel repair    . ENDOVENOUS ABLATION SAPHENOUS VEIN W/ LASER Left 09/27/2017   endovenous laser ablation left greater saphenous vein and stab phlebectomy > 20 incisions left leg by Tinnie Gens MD  . [place]  2000  . LEFT HEART CATH AND CORONARY ANGIOGRAPHY N/A 10/25/2017   Procedure: LEFT HEART CATH AND CORONARY ANGIOGRAPHY;  Surgeon: Adrian Prows, MD;  Location: [facility];  Service: Cardiovascular;  Laterality: N/A;  . TONSILLECTOMY      MEDICATIONS: . albuterol (PROVENTIL HFA;VENTOLIN HFA) 108 (90 Base) MCG/ACT inhaler  . amLODipine (NORVASC) 10 MG tablet  . amLODipine (NORVASC) 5 MG tablet  . amphetamine-dextroamphetamine (ADDERALL) 30 MG tablet  . aspirin 325 MG tablet  . atorvastatin (LIPITOR) 10 MG tablet  . buPROPion (WELLBUTRIN XL) 150 MG 24 hr tablet  . cycloSPORINE (RESTASIS) 0.05 % ophthalmic emulsion  . furosemide (LASIX) 20 MG tablet  . gabapentin (NEURONTIN) 800 MG tablet  . isosorbide mononitrate (IMDUR) 60 MG 24 hr tablet  . methimazole (TAPAZOLE) 5 MG tablet  . metoprolol tartrate (LOPRESSOR) 25 MG tablet  . mometasone (ELOCON) 0.1 % ointment  . nicotine (NICODERM CQ - DOSED IN MG/24 HOURS) 14 mg/24hr patch  . nitroGLYCERIN (NITROSTAT) 0.4 MG SL tablet  . Oxycodone HCl 10 MG TABS  . sertraline  (ZOLOFT) 100 MG tablet  . Tretinoin 0.05 % LOTN   No current facility-administered medications for this encounter.    George Hugh [facility] Short Stay Center/Anesthesiology Phone [phone] 03/15/2018 9:53 AM

## 2018-03-18 ENCOUNTER — Encounter: Payer: Self-pay | Admitting: Thoracic Surgery (Cardiothoracic Vascular Surgery)

## 2018-03-18 ENCOUNTER — Other Ambulatory Visit: Payer: Self-pay

## 2018-03-18 ENCOUNTER — Ambulatory Visit: Payer: Medicaid Other | Admitting: Thoracic Surgery (Cardiothoracic Vascular Surgery)

## 2018-03-18 ENCOUNTER — Inpatient Hospital Stay (HOSPITAL_COMMUNITY)
Admission: RE | Admit: 2018-03-18 | Payer: Medicaid Other | Source: Ambulatory Visit | Admitting: Thoracic Surgery (Cardiothoracic Vascular Surgery)

## 2018-03-18 ENCOUNTER — Other Ambulatory Visit: Payer: Self-pay | Admitting: *Deleted

## 2018-03-18 ENCOUNTER — Encounter (HOSPITAL_COMMUNITY): Admission: RE | Payer: Self-pay | Source: Ambulatory Visit

## 2018-03-18 VITALS — BP 126/95 | HR 74 | Resp 16 | Ht 62.0 in | Wt 170.0 lb

## 2018-03-18 DIAGNOSIS — J9859 Other diseases of mediastinum, not elsewhere classified: Secondary | ICD-10-CM | POA: Diagnosis not present

## 2018-03-18 SURGERY — MEDIAN STERNOTOMY
Anesthesia: General

## 2018-03-18 NOTE — Progress Notes (Signed)
[address]       [place] [address]             [phone]     HPI: Amanda Cardenas returns to discuss the results of her PET/CT.  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  A catheterization she had normal coronaries.  She smoked about 1 pack of cigarettes daily for 25 years.  She recently had a chest x-ray which showed a prominent left hilum.  CT of the chest showed a 4.5 x 3.5 x 6.4 cm left-sided anterior mediastinal "mass."  The CT was done at [facility].  She had a CT angio in March 2018, which also showed the abnormality.  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  I sent her for a PET CT to further assess the finding.  It showed the "mass" to be a simple cyst with no metabolic activity.  She continues to have atypical chest pain.  It tends to be sharp pain localized under the left breast.  It is of very short duration and not typical of angina.  Past Medical History:  Diagnosis Date  . Abnormal thyroid ultrasound 02/08/2017   multinodular goiter  . Acid reflux   . Arthritis   . 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 Baker cyst   . Barrett's esophagus   . Colon polyps   . Gout   . H/O colonoscopy 08/2016  . Hx of cardiac cath 10/25/2017   NO INTERVENTION  . Hypertension   . Hyperthyroidism   . Migraines   . Vitamin D deficiency     Current Outpatient Medications  Medication Sig Dispense Refill  . albuterol (PROVENTIL HFA;VENTOLIN HFA) 108 (90 Base) MCG/ACT inhaler Inhale into the lungs every 6 (six) hours as needed for wheezing or shortness of breath.    Marland Kitchen amLODipine (NORVASC) 10 MG tablet Take 10 mg by mouth daily.    Marland Kitchen amphetamine-dextroamphetamine (ADDERALL) 30 MG  tablet Take 15 mg by mouth 2 (two) times daily. IN THE MORNING & IN THE AFTERNOON.    Marland Kitchen aspirin 325 MG tablet Take 325 mg by mouth daily.    Marland Kitchen atorvastatin (LIPITOR) 10 MG tablet Take 1 tablet (10 mg total) by mouth daily. 30 tablet 1  . buPROPion (WELLBUTRIN XL) 150 MG 24 hr tablet Take 150 mg by mouth daily.    . cycloSPORINE (RESTASIS) 0.05 % ophthalmic emulsion Place 1 drop into both eyes 2 (two) times daily as needed (for dry eyes.).    Marland Kitchen furosemide (LASIX) 20 MG tablet Take 20 mg by mouth daily.    Marland Kitchen gabapentin (NEURONTIN) 800 MG tablet Take 800 mg by mouth 3 (three) times daily.    . isosorbide mononitrate (IMDUR) 60 MG 24 hr tablet Take 1 tablet (60 mg total) by mouth daily. 30 tablet 1  . metoprolol tartrate (LOPRESSOR) 25 MG tablet Take 25 mg by mouth daily.    . mometasone (ELOCON) 0.1 % ointment Apply 1 application topically daily as needed (for eczema).     . nitroGLYCERIN (NITROSTAT) 0.4 MG SL tablet Place 1 tablet (0.4 mg total) under the tongue every 5 (five) minutes x 3 doses as needed for chest pain. 25 tablet 1  .  Oxycodone HCl 10 MG TABS Take 15 mg by mouth 4 (four) times daily.   0  . sertraline (ZOLOFT) 100 MG tablet Take 150 mg by mouth daily.     . Tretinoin 0.05 % LOTN Apply 1 application topically at bedtime as needed (for skin blemishes (on face) ACNE.).     Marland Kitchen methimazole (TAPAZOLE) 5 MG tablet Take 5 mg by mouth daily.    . nicotine (NICODERM CQ - DOSED IN MG/24 HOURS) 14 mg/24hr patch Place 1 patch (14 mg total) onto the skin daily. (Patient not taking: Reported on 03/08/2018) 30 patch 1   No current facility-administered medications for this visit.     Physical Exam BP (!) 126/95 (BP Location: Left Arm, Patient Position: Sitting, Cuff Size: Large)   Pulse 74   Resp 16   Ht 5\' 2"  (1.575 m)   Wt 170 lb (77.1 kg)   LMP 10/18/2011 (LMP Unknown)   SpO2 97% Comment: ON RA  BMI 31.48 kg/m  52 year old woman in no acute distress Alert and oriented x3 with no focal  deficits Lungs clear with good breath sounds bilaterally Cardiac regular rate and rhythm  Diagnostic Tests: NUCLEAR MEDICINE PET SKULL BASE TO THIGH  TECHNIQUE: 8.5 mCi F-18 FDG was injected intravenously. Full-ring PET imaging was performed from the skull base to thigh after the radiotracer. CT data was obtained and used for attenuation correction and anatomic localization.  Fasting blood glucose: 104 mg/dl  COMPARISON:  CT scan 12/24/2016  FINDINGS: Mediastinal blood pool activity: SUV max 1.81  NECK: No hypermetabolic lymph nodes in the neck.  Incidental CT findings: Multinodular thyroid goiter.  CHEST: 4.5 cm cyst is not hypermetabolic. It could be a pericardial cyst or possibly a thymic cyst. No mediastinal or hilar adenopathy. No supraclavicular or axillary adenopathy. Small scattered lymph nodes are stable.  No worrisome pulmonary nodules.  Incidental CT findings: Stable large hiatal hernia with paraesophageal component.  ABDOMEN/PELVIS: No abnormal hypermetabolic activity within the liver, pancreas, adrenal glands, or spleen. No hypermetabolic lymph nodes in the abdomen or pelvis.  Incidental CT findings: Stable benign left adrenal gland adenoma.  Severely chronically atrophied left kidney and compensatory hypertrophy of the right kidney.  SKELETON: No focal hypermetabolic activity to suggest skeletal metastasis.  Incidental CT findings: none  IMPRESSION: 1. 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 No hypermetabolism. No enlarged or hypermetabolic mediastinal or hilar lymph nodes. 2. No significant findings in the neck, abdomen or pelvis.   Electronically Signed   By: Marijo Sanes M.D.   On: 03/13/2018 10:39 I personally reviewed the PET/CT images and concur with the findings noted above.  Impression: Amanda Cardenas is a 52 year old woman with a middle mediastinal cyst noted on a CT  done outside of our system recently.  It showed a 4.5 x 3.5 x 6.4 cm left-sided mediastinal abnormality.  It turns out she had a CT angiogram in our system about a year ago and that finding was present at that time.  On PET CT there is no metabolic activity in the findings are consistent with a simple cyst.  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  I discussed options for dealing with the cyst with Amanda Cardenas.  One option would be continued radiographic follow-up.  Another option would be surgical resection.  I think the best option in her case is to attempt a percutaneous drainage.  This will  allow [place] to send the fluid for cytology and also to assess whether or drain the cyst had any impact on her atypical chest pain.  She is in favor of an attempted percutaneous drainage.  I spoke with Dr. Vernard Gambles interventional radiology.  He will attempt the drainage.  Plan: CT-guided percutaneous drainage of mediastinal cyst.  I will see her back in about a month to discuss the results  [name], MD [facility] [phone]

## 2018-03-18 NOTE — H&P (View-Only) (Signed)
[address]       [place] [address]             [phone]     HPI: Amanda Cardenas returns to discuss the results of her PET/CT.  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  A catheterization she had normal coronaries.  She smoked about 1 pack of cigarettes daily for 25 years.  She recently had a chest x-ray which showed a prominent left hilum.  CT of the chest showed a 4.5 x 3.5 x 6.4 cm left-sided anterior mediastinal "mass."  The CT was done at [facility].  She had a CT angio in March 2018, which also showed the abnormality.  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  I sent her for a PET CT to further assess the finding.  It showed the "mass" to be a simple cyst with no metabolic activity.  She continues to have atypical chest pain.  It tends to be sharp pain localized under the left breast.  It is of very short duration and not typical of angina.  Past Medical History:  Diagnosis Date  . Abnormal thyroid ultrasound 02/08/2017   multinodular goiter  . Acid reflux   . Arthritis   . 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 Baker cyst   . Barrett's esophagus   . Colon polyps   . Gout   . H/O colonoscopy 08/2016  . Hx of cardiac cath 10/25/2017   NO INTERVENTION  . Hypertension   . Hyperthyroidism   . Migraines   . Vitamin D deficiency     Current Outpatient Medications  Medication Sig Dispense Refill  . albuterol (PROVENTIL HFA;VENTOLIN HFA) 108 (90 Base) MCG/ACT inhaler Inhale into the lungs every 6 (six) hours as needed for wheezing or shortness of breath.    Marland Kitchen amLODipine (NORVASC) 10 MG tablet Take 10 mg by mouth daily.    Marland Kitchen amphetamine-dextroamphetamine (ADDERALL) 30 MG  tablet Take 15 mg by mouth 2 (two) times daily. IN THE MORNING & IN THE AFTERNOON.    Marland Kitchen aspirin 325 MG tablet Take 325 mg by mouth daily.    Marland Kitchen atorvastatin (LIPITOR) 10 MG tablet Take 1 tablet (10 mg total) by mouth daily. 30 tablet 1  . buPROPion (WELLBUTRIN XL) 150 MG 24 hr tablet Take 150 mg by mouth daily.    . cycloSPORINE (RESTASIS) 0.05 % ophthalmic emulsion Place 1 drop into both eyes 2 (two) times daily as needed (for dry eyes.).    Marland Kitchen furosemide (LASIX) 20 MG tablet Take 20 mg by mouth daily.    Marland Kitchen gabapentin (NEURONTIN) 800 MG tablet Take 800 mg by mouth 3 (three) times daily.    . isosorbide mononitrate (IMDUR) 60 MG 24 hr tablet Take 1 tablet (60 mg total) by mouth daily. 30 tablet 1  . metoprolol tartrate (LOPRESSOR) 25 MG tablet Take 25 mg by mouth daily.    . mometasone (ELOCON) 0.1 % ointment Apply 1 application topically daily as needed (for eczema).     . nitroGLYCERIN (NITROSTAT) 0.4 MG SL tablet Place 1 tablet (0.4 mg total) under the tongue every 5 (five) minutes x 3 doses as needed for chest pain. 25 tablet 1  .  Oxycodone HCl 10 MG TABS Take 15 mg by mouth 4 (four) times daily.   0  . sertraline (ZOLOFT) 100 MG tablet Take 150 mg by mouth daily.     . Tretinoin 0.05 % LOTN Apply 1 application topically at bedtime as needed (for skin blemishes (on face) ACNE.).     Marland Kitchen methimazole (TAPAZOLE) 5 MG tablet Take 5 mg by mouth daily.    . nicotine (NICODERM CQ - DOSED IN MG/24 HOURS) 14 mg/24hr patch Place 1 patch (14 mg total) onto the skin daily. (Patient not taking: Reported on 03/08/2018) 30 patch 1   No current facility-administered medications for this visit.     Physical Exam BP (!) 126/95 (BP Location: Left Arm, Patient Position: Sitting, Cuff Size: Large)   Pulse 74   Resp 16   Ht 5\' 2"  (1.575 m)   Wt 170 lb (77.1 kg)   LMP 10/18/2011 (LMP Unknown)   SpO2 97% Comment: ON RA  BMI 31.54 kg/m  52 year old woman in no acute distress Alert and oriented x3 with no focal  deficits Lungs clear with good breath sounds bilaterally Cardiac regular rate and rhythm  Diagnostic Tests: NUCLEAR MEDICINE PET SKULL BASE TO THIGH  TECHNIQUE: 8.5 mCi F-18 FDG was injected intravenously. Full-ring PET imaging was performed from the skull base to thigh after the radiotracer. CT data was obtained and used for attenuation correction and anatomic localization.  Fasting blood glucose: 104 mg/dl  COMPARISON:  CT scan 12/24/2016  FINDINGS: Mediastinal blood pool activity: SUV max 1.81  NECK: No hypermetabolic lymph nodes in the neck.  Incidental CT findings: Multinodular thyroid goiter.  CHEST: 4.5 cm cyst is not hypermetabolic. It could be a pericardial cyst or possibly a thymic cyst. No mediastinal or hilar adenopathy. No supraclavicular or axillary adenopathy. Small scattered lymph nodes are stable.  No worrisome pulmonary nodules.  Incidental CT findings: Stable large hiatal hernia with paraesophageal component.  ABDOMEN/PELVIS: No abnormal hypermetabolic activity within the liver, pancreas, adrenal glands, or spleen. No hypermetabolic lymph nodes in the abdomen or pelvis.  Incidental CT findings: Stable benign left adrenal gland adenoma.  Severely chronically atrophied left kidney and compensatory hypertrophy of the right kidney.  SKELETON: No focal hypermetabolic activity to suggest skeletal metastasis.  Incidental CT findings: none  IMPRESSION: 1. 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 No hypermetabolism. No enlarged or hypermetabolic mediastinal or hilar lymph nodes. 2. No significant findings in the neck, abdomen or pelvis.   Electronically Signed   By: Marijo Sanes M.D.   On: 03/13/2018 10:39 I personally reviewed the PET/CT images and concur with the findings noted above.  Impression: Amanda Cardenas is a 52 year old woman with a middle mediastinal cyst noted on a CT  done outside of our system recently.  It showed a 4.5 x 3.5 x 6.4 cm left-sided mediastinal abnormality.  It turns out she had a CT angiogram in our system about a year ago and that finding was present at that time.  On PET CT there is no metabolic activity in the findings are consistent with a simple cyst.  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  I discussed options for dealing with the cyst with Amanda Cardenas.  One option would be continued radiographic follow-up.  Another option would be surgical resection.  I think the best option in her case is to attempt a percutaneous drainage.  This will  allow [place] to send the fluid for cytology and also to assess whether or drain the cyst had any impact on her atypical chest pain.  She is in favor of an attempted percutaneous drainage.  I spoke with Dr. Vernard Gambles interventional radiology.  He will attempt the drainage.  Plan: CT-guided percutaneous drainage of mediastinal cyst.  I will see her back in about a month to discuss the results  [name], MD [facility] [phone]

## 2018-03-19 ENCOUNTER — Other Ambulatory Visit: Payer: Self-pay | Admitting: *Deleted

## 2018-03-19 DIAGNOSIS — Q248 Other specified congenital malformations of heart: Secondary | ICD-10-CM

## 2018-03-24 ENCOUNTER — Other Ambulatory Visit: Payer: Self-pay | Admitting: Radiology

## 2018-03-26 ENCOUNTER — Encounter (HOSPITAL_COMMUNITY): Payer: Self-pay

## 2018-03-26 ENCOUNTER — Other Ambulatory Visit: Payer: Self-pay | Admitting: Thoracic Surgery (Cardiothoracic Vascular Surgery)

## 2018-03-26 ENCOUNTER — Ambulatory Visit (HOSPITAL_COMMUNITY)
Admission: RE | Admit: 2018-03-26 | Discharge: 2018-03-26 | Disposition: A | Discharge status: Home / Self Care | Payer: Medicaid Other | Source: Ambulatory Visit | Attending: Thoracic Surgery (Cardiothoracic Vascular Surgery) | Admitting: Thoracic Surgery (Cardiothoracic Vascular Surgery)

## 2018-03-26 DIAGNOSIS — Q248 Other specified congenital malformations of heart: Secondary | ICD-10-CM

## 2018-03-26 DIAGNOSIS — Z86010 Personal history of colonic polyps: Secondary | ICD-10-CM | POA: Diagnosis not present

## 2018-03-26 DIAGNOSIS — M109 Gout, unspecified: Secondary | ICD-10-CM | POA: Insufficient documentation

## 2018-03-26 DIAGNOSIS — K449 Diaphragmatic hernia without obstruction or gangrene: Secondary | ICD-10-CM | POA: Insufficient documentation

## 2018-03-26 DIAGNOSIS — J9859 Other diseases of mediastinum, not elsewhere classified: Secondary | ICD-10-CM | POA: Diagnosis present

## 2018-03-26 DIAGNOSIS — Z888 Allergy status to other drugs, medicaments and biological substances status: Secondary | ICD-10-CM | POA: Diagnosis not present

## 2018-03-26 DIAGNOSIS — Z79899 Other long term (current) drug therapy: Secondary | ICD-10-CM | POA: Diagnosis not present

## 2018-03-26 DIAGNOSIS — N2881 Hypertrophy of kidney: Secondary | ICD-10-CM | POA: Insufficient documentation

## 2018-03-26 DIAGNOSIS — Z7982 Long term (current) use of aspirin: Secondary | ICD-10-CM | POA: Diagnosis not present

## 2018-03-26 DIAGNOSIS — K219 Gastro-esophageal reflux disease without esophagitis: Secondary | ICD-10-CM | POA: Diagnosis not present

## 2018-03-26 DIAGNOSIS — E559 Vitamin D deficiency, unspecified: Secondary | ICD-10-CM | POA: Diagnosis not present

## 2018-03-26 DIAGNOSIS — F17210 Nicotine dependence, cigarettes, uncomplicated: Secondary | ICD-10-CM | POA: Insufficient documentation

## 2018-03-26 DIAGNOSIS — K2270 Barrett's esophagus without dysplasia: Secondary | ICD-10-CM | POA: Diagnosis not present

## 2018-03-26 DIAGNOSIS — R222 Localized swelling, mass and lump, trunk: Secondary | ICD-10-CM | POA: Insufficient documentation

## 2018-03-26 DIAGNOSIS — E0520 Thyrotoxicosis with toxic multinodular goiter without thyrotoxic crisis or storm: Secondary | ICD-10-CM | POA: Insufficient documentation

## 2018-03-26 DIAGNOSIS — I10 Essential (primary) hypertension: Secondary | ICD-10-CM | POA: Insufficient documentation

## 2018-03-26 DIAGNOSIS — I2510 Atherosclerotic heart disease of native coronary artery without angina pectoris: Secondary | ICD-10-CM | POA: Insufficient documentation

## 2018-03-26 LAB — CBC
HCT: 42.8 % (ref 36.0–46.0)
Hemoglobin: 13.7 g/dL (ref 12.0–15.0)
MCH: 30 pg (ref 26.0–34.0)
MCHC: 32 g/dL (ref 30.0–36.0)
MCV: 93.7 fL (ref 78.0–100.0)
Platelets: 301 10*3/uL (ref 150–400)
RBC: 4.57 MIL/uL (ref 3.87–5.11)
RDW: 13 % (ref 11.5–15.5)
WBC: 9.4 10*3/uL (ref 4.0–10.5)

## 2018-03-26 LAB — PROTIME-INR
INR: 0.92
PROTHROMBIN TIME: 12.3 s (ref 11.4–15.2)

## 2018-03-26 MED ORDER — LIDOCAINE-EPINEPHRINE 1 %-1:100000 IJ SOLN
INTRAMUSCULAR | Status: AC
  Filled 2018-03-26: qty 1

## 2018-03-26 MED ORDER — SODIUM CHLORIDE 0.9 % IV SOLN: INTRAVENOUS | Status: DC

## 2018-03-26 MED ORDER — FENTANYL CITRATE (PF) 100 MCG/2ML IJ SOLN
INTRAMUSCULAR | Status: AC
  Filled 2018-03-26: qty 4

## 2018-03-26 MED ORDER — MIDAZOLAM HCL 2 MG/2ML IJ SOLN
INTRAMUSCULAR | Status: AC
  Filled 2018-03-26: qty 4

## 2018-03-26 MED ORDER — FENTANYL CITRATE (PF) 100 MCG/2ML IJ SOLN
INTRAMUSCULAR | Status: AC | PRN
  Administered 2018-03-26 (×2): 50 ug via INTRAVENOUS

## 2018-03-26 MED ORDER — MIDAZOLAM HCL 2 MG/2ML IJ SOLN
INTRAMUSCULAR | Status: AC | PRN
  Administered 2018-03-26 (×2): 1 mg via INTRAVENOUS

## 2018-03-26 NOTE — H&P (Signed)
Chief Complaint: Patient was seen in consultation today for pericardial cyst aspiration at the request of Amanda Cardenas  Referring Physician(s): Amanda Cardenas  Supervising Physician: Amanda Cardenas  Patient Status: [facility] - Out-pt  History of Present Illness: Amanda Cardenas is a 52 y.o. female   +smoker Has had a mild chest pain off and on for months CXR 01/2018 reveals convex contour- thought to correspond to location of left pericardial cyst seen on CTA 11/2016 Referred to Dr Amanda Cardenas for evaluation PET 03/13/18:  IMPRESSION: 1. 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 No hypermetabolism. No enlarged or hypermetabolic mediastinal or hilar lymph nodes. 2. No significant findings in the neck, abdomen or pelvis.  6/17/Dr Amanda Cardenas note: Impression: Amanda Cardenas is a 52 year old woman with a middle mediastinal cyst noted on a CT done outside of our system recently.  It showed a 4.5 x 3.5 x 6.4 cm left-sided mediastinal abnormality.  It turns out she had a CT angiogram in our system about a year ago and that finding was present at that time.  On PET CT there is no metabolic activity in the findings are consistent with a simple cyst.  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 I discussed options for dealing with the cyst with Amanda Cardenas.  One option would be continued radiographic follow-up.  Another option would be surgical resection.  I think the best option in her case is to attempt a percutaneous drainage.  This will allow [place] to send the fluid for cytology and also to assess whether or drain the cyst had any impact on her atypical chest pain.  Now scheduled for pericardial cyst aspiration   Past Medical History:  Diagnosis Date  . Abnormal thyroid ultrasound 02/08/2017   multinodular goiter  . Acid reflux   . Arthritis   . 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 Baker cyst   . Barrett's esophagus   . Colon polyps   . Gout   . H/O colonoscopy 08/2016  . Hx of cardiac cath 10/25/2017   NO INTERVENTION  . Hypertension   . Hyperthyroidism   . Migraines   . Vitamin D deficiency     Past Surgical History:  Procedure Laterality Date  . ABDOMINAL HYSTERECTOMY    . APPENDECTOMY    . carpal tunnel repair    . ENDOVENOUS ABLATION SAPHENOUS VEIN W/ LASER Left 09/27/2017   endovenous laser ablation left greater saphenous vein and stab phlebectomy > 20 incisions left leg by Tinnie Gens MD  . [place]  2000  . LEFT HEART CATH AND CORONARY ANGIOGRAPHY N/A 10/25/2017   Procedure: LEFT HEART CATH AND CORONARY ANGIOGRAPHY;  Surgeon: Amanda Prows, MD;  Location: [place] [facility];  Service: Cardiovascular;  Laterality: N/A;  . TONSILLECTOMY      Allergies: Zofran [ondansetron hcl]; Tramadol; and Prednisone  Medications: Prior to Admission medications   Medication Sig Start Date End Date Taking? Authorizing Provider  albuterol (PROVENTIL HFA;VENTOLIN HFA) 108 (90 Base) MCG/ACT inhaler Inhale into the lungs every 6 (six) hours as needed for wheezing or shortness of breath.   Yes [provider]  amLODipine (NORVASC) 10 MG tablet Take 10 mg by mouth daily.   Yes [provider]  amphetamine-dextroamphetamine (ADDERALL) 30 MG tablet Take 15 mg by mouth 2 (two) times daily. IN THE MORNING & IN  THE AFTERNOON.   Yes [provider]  aspirin 325 MG tablet Take 325 mg by mouth daily.   Yes [provider]  atorvastatin (LIPITOR) 10 MG tablet Take 1 tablet (10 mg total) by mouth daily. 10/26/17 10/26/18 Yes Amanda Prows, MD  buPROPion (WELLBUTRIN XL) 150 MG 24 hr tablet Take 150 mg by mouth daily.   Yes [provider]  furosemide (LASIX) 20 MG tablet Take 20 mg by mouth daily.   Yes [provider]  gabapentin (NEURONTIN) 800 MG tablet Take  800 mg by mouth 3 (three) times daily.   Yes [provider]  isosorbide mononitrate (IMDUR) 60 MG 24 hr tablet Take 1 tablet (60 mg total) by mouth daily. 10/27/17  Yes Amanda Prows, MD  metoprolol tartrate (LOPRESSOR) 25 MG tablet Take 25 mg by mouth daily.   Yes [provider]  mometasone (ELOCON) 0.1 % ointment Apply 1 application topically daily as needed (for eczema).    Yes [provider]  nitroGLYCERIN (NITROSTAT) 0.4 MG SL tablet Place 1 tablet (0.4 mg total) under the tongue every 5 (five) minutes x 3 doses as needed for chest pain. 10/26/17  Yes Amanda Prows, MD  Oxycodone HCl 10 MG TABS Take 15 mg by mouth 4 (four) times daily.  01/22/18  Yes [provider]  sertraline (ZOLOFT) 100 MG tablet Take 150 mg by mouth daily.    Yes [provider]  Tretinoin 0.05 % LOTN Apply 1 application topically at bedtime as needed (for skin blemishes (on face) ACNE.).    Yes [provider]  cycloSPORINE (RESTASIS) 0.05 % ophthalmic emulsion Place 1 drop into both eyes 2 (two) times daily as needed (for dry eyes.).    [provider]  methimazole (TAPAZOLE) 5 MG tablet Take 5 mg by mouth daily. 02/06/18   [provider]  nicotine (NICODERM CQ - DOSED IN MG/24 HOURS) 14 mg/24hr patch Place 1 patch (14 mg total) onto the skin daily. Patient not taking: Reported on 03/08/2018 10/26/17 12/25/17  Amanda Prows, MD     Family History  Problem Relation Age of Onset  . Hypertension Mother   . Hyperlipidemia Mother   . Diabetes Mellitus II Mother   . Heart disease Mother   . Depression Mother     Social History   Socioeconomic History  . Marital status: Single    Spouse name: Not on file  . Number of children: 4  . Years of education: Not on file  . Highest education level: Not on file  Occupational History  . Occupation: unemployed  Social Needs  . Financial resource strain: Not on file  . Food insecurity:    Worry: Not on file     Inability: Not on file  . Transportation needs:    Medical: Not on file    Non-medical: Not on file  Tobacco Use  . Smoking status: Current Some Day Smoker    Packs/day: 1.00    Years: 25.00    Pack years: 25.00    Types: Cigarettes  . Smokeless tobacco: Never Used  . Tobacco comment: trying to quit  Substance and Sexual Activity  . Alcohol use: No  . Drug use: No  . Sexual activity: Not Currently  Lifestyle  . Physical activity:    Days per week: Not on file    Minutes per session: Not on file  . Stress: Not on file  Relationships  . Social connections:    Talks on phone:  Not on file    Gets together: Not on file    Attends religious service: Not on file    Active member of club or organization: Not on file    Attends meetings of clubs or organizations: Not on file    Relationship status: Not on file  Other Topics Concern  . Not on file  Social History Narrative  . Not on file    Review of Systems: A 12 point ROS discussed and pertinent positives are indicated in the HPI above.  All other systems are negative.  Review of Systems  Constitutional: Negative for activity change and fever.  Respiratory: Negative for cough and shortness of breath.   Cardiovascular: Positive for chest pain.  Gastrointestinal: Negative for abdominal pain.  Musculoskeletal: Negative for back pain.  Neurological: Negative for weakness.  Psychiatric/Behavioral: Negative for behavioral problems and confusion.    Vital Signs: BP (!) 129/101   Pulse 68   Resp 20   Ht 5\' 4"  (1.626 m)   Wt 170 lb (77.1 kg)   LMP 10/18/2011 (LMP Unknown)   SpO2 98%   BMI 29.18 kg/m   Physical Exam  Constitutional: She is oriented to person, place, and time.  Cardiovascular: Normal rate, regular rhythm and normal heart sounds.  Pulmonary/Chest: Effort normal and breath sounds normal.  Abdominal: Soft. Bowel sounds are normal.  Musculoskeletal: Normal range of motion.  Neurological: She is alert and  oriented to person, place, and time.  Skin: Skin is warm and dry.  Psychiatric: She has a normal mood and affect. Her behavior is normal. Judgment and thought content normal.  Nursing note and vitals reviewed.   Imaging: Nm Pet Image Initial (pi) Skull Base To Thigh  Result Date: 03/13/2018 CLINICAL DATA:  Initial treatment strategy for mediastinal mass. EXAM: NUCLEAR MEDICINE PET SKULL BASE TO THIGH TECHNIQUE: 8.5 mCi F-18 FDG was injected intravenously. Full-ring PET imaging was performed from the skull base to thigh after the radiotracer. CT data was obtained and used for attenuation correction and anatomic localization. Fasting blood glucose: 104 mg/dl COMPARISON:  CT scan 12/24/2016 FINDINGS: Mediastinal blood pool activity: SUV max 1.81 NECK: No hypermetabolic lymph nodes in the neck. Incidental CT findings: Multinodular thyroid goiter. CHEST: 4.5 cm cyst is not hypermetabolic. It could be a pericardial cyst or possibly a thymic cyst. No mediastinal or hilar adenopathy. No supraclavicular or axillary adenopathy. Small scattered lymph nodes are stable. No worrisome pulmonary nodules. Incidental CT findings: Stable large hiatal hernia with paraesophageal component. ABDOMEN/PELVIS: No abnormal hypermetabolic activity within the liver, pancreas, adrenal glands, or spleen. No hypermetabolic lymph nodes in the abdomen or pelvis. Incidental CT findings: Stable benign left adrenal gland adenoma. Severely chronically atrophied left kidney and compensatory hypertrophy of the right kidney. SKELETON: No focal hypermetabolic activity to suggest skeletal metastasis. Incidental CT findings: none IMPRESSION: 1. 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 No hypermetabolism. No enlarged or hypermetabolic mediastinal or hilar lymph nodes. 2. No significant findings in the neck, abdomen or pelvis. Electronically Signed   By: Marijo Sanes M.D.   On: 03/13/2018 10:39     Labs:  CBC: Recent Labs    10/25/17 0520 10/26/17 0607 02/11/18 1337 03/14/18 0953  WBC 10.0 8.9 9.1 9.2  HGB 14.6 13.3 14.0 12.7  HCT 44.5 42.0 43.2 39.4  PLT 286 253 284 254    COAGS: Recent Labs    10/25/17 1229 03/14/18 0953  INR 1.05 0.91  APTT  --  30    BMP: Recent Labs    10/25/17 0520 02/11/18 1337 03/14/18 0953  NA 138 139 140  K 4.1 3.7 4.1  CL 105 104 107  CO2 20* 25 24  GLUCOSE 111* 115* 114*  BUN 20 13 13   CALCIUM 9.0 9.4 8.8*  CREATININE 0.79 0.96 1.00  GFRNONAA >60 >60 >60  GFRAA >60 >60 >60    LIVER FUNCTION TESTS: Recent Labs    03/14/18 0953  BILITOT 0.5  AST 16  ALT 17  ALKPHOS 64  PROT 6.2*  ALBUMIN 3.6    TUMOR MARKERS: Recent Labs    03/05/18 1416  AFPTM 1.3    Assessment and Plan:  Left pericardial cyst Scheduled for aspiration  Risks and benefits discussed with the patient including, but not limited to bleeding, infection, damage to adjacent structures or low yield requiring additional tests.  All of the patient's questions were answered, patient is agreeable to proceed. Consent signed and in chart.   Thank you for this interesting consult.  I greatly enjoyed meeting Ralph H Wion and look forward to participating in their care.  A copy of this report was sent to the requesting provider on this date.  Electronically Signed: Lavonia Drafts, PA-Cardenas 03/26/2018, 8:12 AM   I spent a total of  30 Minutes   in face to face in clinical consultation, greater than 50% of which was counseling/coordinating care for left pericardial cyst aspiration

## 2018-03-26 NOTE — Discharge Instructions (Signed)
Needle Biopsy, Care After °These instructions give you information about caring for yourself after your procedure. Your doctor may also give you more specific instructions. Call your doctor if you have any problems or questions after your procedure. °Follow these instructions at home: °· Rest as told by your doctor. °· Take medicines only as told by your doctor. °· There are many different ways to close and cover the biopsy site, including stitches (sutures), skin glue, and adhesive strips. Follow instructions from your doctor about: °? How to take care of your biopsy site. °? When and how you should change your bandage (dressing). °? When you should remove your dressing. °? Removing whatever was used to close your biopsy site. °· Check your biopsy site every day for signs of infection. Watch for: °? Redness, swelling, or pain. °? Fluid, blood, or pus. °Contact a doctor if: °· You have a fever. °· You have redness, swelling, or pain at the biopsy site, and it lasts longer than a few days. °· You have fluid, blood, or pus coming from the biopsy site. °· You feel sick to your stomach (nauseous). °· You throw up (vomit). °Get help right away if: °· You are short of breath. °· You have trouble breathing. °· Your chest hurts. °· You feel dizzy or you pass out (faint). °· You have bleeding that does not stop with pressure or a bandage. °· You cough up blood. °· Your belly (abdomen) hurts. °This information is not intended to replace advice given to you by your health care provider. Make sure you discuss any questions you have with your health care provider. °Document Released: 08/31/2008 Document Revised: 02/24/2016 Document Reviewed: 09/14/2014 °Elsevier Interactive Patient Education © 2018 Elsevier Inc. ° °

## 2018-03-26 NOTE — Procedures (Signed)
Pre procedural Dx: Mediastinal Mass  Post procedural Dx: Same  Technically successful CT guided biopsy of anterior mediastinal mass   EBL: None.   Complications: None immediate.   Ronny Bacon, MD Pager #: [phone]

## 2018-04-03 ENCOUNTER — Other Ambulatory Visit: Payer: Self-pay | Admitting: *Deleted

## 2018-04-03 DIAGNOSIS — J9859 Other diseases of mediastinum, not elsewhere classified: Secondary | ICD-10-CM

## 2018-04-05 ENCOUNTER — Encounter: Payer: Self-pay | Admitting: *Deleted

## 2018-04-08 NOTE — Pre-Procedure Instructions (Signed)
Amanda Cardenas  04/08/2018      [id number] [address], [place] [address] Phone: [phone] Fax: [phone]    Your procedure is scheduled on Thursday July 11.  Report to [facility] Admitting at 6:00 A.M.  Call this number if you have problems the morning of surgery:  [phone]   Remember:  Do not eat or drink after midnight.    Take these medicines the morning of surgery with A SIP OF WATER:   Metoprolol (lopressor) Amlodipine (norvasc) Bupropion (wellbutrin) Gabapentin (neurontin) Isosorbide (Imdur) Sertraline (zoloft) Oxycodone if needed Albuterol if needed (please bring to hospital with you)  DO NOT take Adderall the morning of surgery  7 days prior to surgery STOP taking any Aleve, Naproxen, Ibuprofen, Motrin, Advil, Goody's, BC's, all herbal medications, fish oil, and all vitamins     Do not wear jewelry, make-up or nail polish.  Do not wear lotions, powders, or perfumes, or deodorant.  Do not shave 48 hours prior to surgery.  Men may shave face and neck.  Do not bring valuables to the hospital.  [facility] is not responsible for any belongings or valuables.  Contacts, dentures or bridgework may not be worn into surgery.  Leave your suitcase in the car.  After surgery it may be brought to your room.  For patients admitted to the hospital, discharge time will be determined by your treatment team.  Patients discharged the day of surgery will not be allowed to drive home.   Special instructions:    [facility]- Preparing For Surgery  Before surgery, you can play an important role. Because skin is not sterile, your skin needs to be as free of germs as possible. You can reduce the number of germs on your skin by washing with CHG (chlorahexidine gluconate) Soap before surgery.  CHG is an antiseptic cleaner which kills germs and bonds with the skin to continue killing germs even after  washing.    Oral Hygiene is also important to reduce your risk of infection.  Remember - BRUSH YOUR TEETH THE MORNING OF SURGERY WITH YOUR REGULAR TOOTHPASTE  Please do not use if you have an allergy to CHG or antibacterial soaps. If your skin becomes reddened/irritated stop using the CHG.  Do not shave (including legs and underarms) for at least 48 hours prior to first CHG shower. It is OK to shave your face.  Please follow these instructions carefully.   1. Shower the NIGHT BEFORE SURGERY and the MORNING OF SURGERY with CHG.   2. If you chose to wash your hair, wash your hair first as usual with your normal shampoo.  3. After you shampoo, rinse your hair and body thoroughly to remove the shampoo.  4. Use CHG as you would any other liquid soap. You can apply CHG directly to the skin and wash gently with a scrungie or a clean washcloth.   5. Apply the CHG Soap to your body ONLY FROM THE NECK DOWN.  Do not use on open wounds or open sores. Avoid contact with your eyes, ears, mouth and genitals (private parts). Wash Face and genitals (private parts)  with your normal soap.  6. Wash thoroughly, paying special attention to the area where your surgery will be performed.  7. Thoroughly rinse your body with warm water from the neck down.  8. DO NOT shower/wash with your normal soap after using and rinsing off the  CHG Soap.  9. Pat yourself dry with a CLEAN TOWEL.  10. Wear CLEAN PAJAMAS to bed the night before surgery, wear comfortable clothes the morning of surgery  11. Place CLEAN SHEETS on your bed the night of your first shower and DO NOT SLEEP WITH PETS.    Day of Surgery:  Do not apply any deodorants/lotions.  Please wear clean clothes to the hospital/surgery center.   Remember to brush your teeth WITH YOUR REGULAR TOOTHPASTE.    Please read over the following fact sheets that you were given. Coughing and Deep Breathing, MRSA Information and Surgical Site Infection  Prevention

## 2018-04-09 NOTE — Progress Notes (Signed)
Anesthesia Chart Review: same day workup  Case:  [id number] Date/Time:  04/11/18 0745   Procedures:      MEDIAN STERNOTOMY (N/A )     THYMECTOMY (N/A )   Anesthesia type:  General   Pre-op diagnosis:  Anterior Mediastinal Mass   Location:  [facility] OR ROOM 10 / [place] OR   Surgeon:  [name], MD      DISCUSSION: Pt is a 52 yo smoker scheduled for the above procedure. 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 Irven Shelling notes indicate coronary spasm).  A catheterization showed no significant coronary disease, ECHO with normal LV function.    Pt was originally scheduled for median sternotomy on 03/15/2018 for mediastinal mass. Subsequent PET CT showed mass to be simple cyst and surgery was cancelled. The pt continued to have sharp atypical chest pain. She was then sent for percutaneous aspiration of mediastinal mass - surgical pathology report indicates benign thymoma. Pt is now scheduled for median sternotomy with thymectomy.  Anticipate she can proceed with surgery as planned barring any acute status change.  VS: LMP 10/18/2011 (LMP Unknown)   PROVIDERS: Milford Cage, PA is at [facility] is listed as PCP  Adrian Prows, MD is cardiologist. According to 10/2017 discharge summary, he was planning to see her in follow-up, but if remained stable would see her back on a PRN basis.  LABS: Labs reviewed: Acceptable for surgery. Labwork will be repeated DOS (all labs ordered are listed, but only abnormal results are displayed)  Lab Results  Component Value Date   WBC 9.4 03/26/2018   HGB 13.7 03/26/2018   HCT 42.8 03/26/2018   PLT 301 03/26/2018   GLUCOSE 114 (H) 03/14/2018   CHOL 163 10/25/2017   TRIG 213 (H) 10/25/2017   HDL 46 10/25/2017   LDLCALC 74 10/25/2017   ALT 17 03/14/2018   AST 16 03/14/2018   NA 140  03/14/2018   K 4.1 03/14/2018   CL 107 03/14/2018   CREATININE 1.00 03/14/2018   BUN 13 03/14/2018   CO2 24 03/14/2018   TSH 0.802 02/11/2018   INR 0.92 03/26/2018  ABG    Component Value Date/Time   PHART 7.436 03/14/2018 0953   PCO2ART 38.8 03/14/2018 0953   PO2ART 71.3 (L) 03/14/2018 0953   HCO3 25.7 03/14/2018 0953   TCO2 30 04/24/2013 0557   O2SAT 94.3 03/14/2018 0953    IMAGES: PET scan 03/13/18: IMPRESSION: 1. 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 No hypermetabolism. No enlarged or hypermetabolic mediastinal or hilar lymph nodes. 2. No significant findings in the neck, abdomen or pelvis.  CT Chest 02/10/18 [facility]): IMPRESSION: Mediastinal mass. Rule out thymoma. Left adrenal gland mass. 15 mm low-density lesion in the liver too small to characterize.   CHEST - 2 VIEW to be obtained DOS or within 72hrs per order  EKG: 03/14/18: NSR  CV: Echo 10/26/17:  Study Conclusions - Left ventricle: The cavity size was normal. Systolic function was normal. Wall motion was normal; there were no regional wall motion abnormalities. - Left atrium: The atrium was mildly dilated. Impressions: - Normal pulmonary artery pressure.  [facility] 10/25/17 (Dr. Adrian Prows): Coronary angiogram 63/87/5643:PIRJJO LV systolic function without wall motion abnormality. Left dominant circulation, tortuous coronary arteries but no significant disease suggestive of hypertensive heart  disease. Small nondominant RCA with anterior origin. Ascending aortogramreveals presence of 3 aortic valve cusps without aortic regurgitation, no dissection was evident, no aneurysm. Recommendation: Patient has positive cardiac markers with EKG abnormalities suggestive of non-STEMI. She may have had coronary spasm that could have lead to non-STEMI. I will observe the patient tonight and trend the serum troponin and will probably discharge in the  morning. She was told to have normal LV function without significant structural abnormality by outpatient echocardiogram which I do not have the results of.  Past Medical History:  Diagnosis Date  . Abnormal thyroid ultrasound 02/08/2017   multinodular goiter  . Acid reflux   . Arthritis   . Atherosclerosis of native coronary artery with angina pectoris with documented spasm ([place]) 10/25/2017  . Baker cyst   . Barrett's esophagus   . Colon polyps   . Gout   . H/O colonoscopy 08/2016  . Hx of cardiac cath 10/25/2017   NO INTERVENTION  . Hypertension   . Hyperthyroidism   . Migraines   . Vitamin D deficiency     Past Surgical History:  Procedure Laterality Date  . ABDOMINAL HYSTERECTOMY    . APPENDECTOMY    . carpal tunnel repair    . ENDOVENOUS ABLATION SAPHENOUS VEIN W/ LASER Left 09/27/2017   endovenous laser ablation left greater saphenous vein and stab phlebectomy > 20 incisions left leg by Tinnie Gens MD  . [place]  2000  . LEFT HEART CATH AND CORONARY ANGIOGRAPHY N/A 10/25/2017   Procedure: LEFT HEART CATH AND CORONARY ANGIOGRAPHY;  Surgeon: Adrian Prows, MD;  Location: [place] [facility];  Service: Cardiovascular;  Laterality: N/A;  . TONSILLECTOMY      MEDICATIONS: No current facility-administered medications for this encounter.    Marland Kitchen albuterol (PROVENTIL HFA;VENTOLIN HFA) 108 (90 Base) MCG/ACT inhaler  . amLODipine (NORVASC) 10 MG tablet  . amphetamine-dextroamphetamine (ADDERALL) 30 MG tablet  . buPROPion (WELLBUTRIN XL) 150 MG 24 hr tablet  . cycloSPORINE (RESTASIS) 0.05 % ophthalmic emulsion  . furosemide (LASIX) 20 MG tablet  . gabapentin (NEURONTIN) 800 MG tablet  . isosorbide mononitrate (IMDUR) 30 MG 24 hr tablet  . metoprolol tartrate (LOPRESSOR) 25 MG tablet  . mometasone (ELOCON) 0.1 % ointment  . mupirocin ointment (BACTROBAN) 2 %  . nitroGLYCERIN (NITROSTAT) 0.4 MG SL tablet  . oxyCODONE (ROXICODONE) 15 MG immediate release tablet  .  sertraline (ZOLOFT) 100 MG tablet  . sertraline (ZOLOFT) 50 MG tablet  . Tretinoin 0.05 % LOTN  . aspirin 325 MG tablet  . atorvastatin (LIPITOR) 10 MG tablet  . methimazole (TAPAZOLE) 5 MG tablet  . nicotine (NICODERM CQ - DOSED IN MG/24 HOURS) 14 mg/24hr patch    Wynonia Musty [facility]/Anesthesiology Phone [phone] 04/09/2018 9:37 AM

## 2018-04-10 ENCOUNTER — Encounter (HOSPITAL_COMMUNITY): Payer: Self-pay

## 2018-04-10 ENCOUNTER — Other Ambulatory Visit: Payer: Self-pay

## 2018-04-10 ENCOUNTER — Ambulatory Visit (HOSPITAL_COMMUNITY)
Admission: RE | Admit: 2018-04-10 | Discharge: 2018-04-10 | Disposition: A | Discharge status: Home / Self Care | Payer: Medicaid Other | Source: Ambulatory Visit | Attending: Thoracic Surgery (Cardiothoracic Vascular Surgery) | Admitting: Thoracic Surgery (Cardiothoracic Vascular Surgery)

## 2018-04-10 ENCOUNTER — Encounter (HOSPITAL_COMMUNITY)
Admission: RE | Admit: 2018-04-10 | Discharge: 2018-04-10 | Disposition: A | Discharge status: Home / Self Care | Payer: Medicaid Other | Source: Ambulatory Visit | Attending: Thoracic Surgery (Cardiothoracic Vascular Surgery) | Admitting: Thoracic Surgery (Cardiothoracic Vascular Surgery)

## 2018-04-10 DIAGNOSIS — Z0183 Encounter for blood typing: Secondary | ICD-10-CM

## 2018-04-10 DIAGNOSIS — K449 Diaphragmatic hernia without obstruction or gangrene: Secondary | ICD-10-CM

## 2018-04-10 DIAGNOSIS — Z01812 Encounter for preprocedural laboratory examination: Secondary | ICD-10-CM | POA: Insufficient documentation

## 2018-04-10 DIAGNOSIS — J9859 Other diseases of mediastinum, not elsewhere classified: Secondary | ICD-10-CM

## 2018-04-10 DIAGNOSIS — Z01818 Encounter for other preprocedural examination: Secondary | ICD-10-CM

## 2018-04-10 LAB — URINALYSIS, ROUTINE W REFLEX MICROSCOPIC
Bilirubin Urine: NEGATIVE
Glucose, UA: NEGATIVE mg/dL
Ketones, ur: NEGATIVE mg/dL
Leukocytes, UA: NEGATIVE
NITRITE: NEGATIVE
PH: 6 (ref 5.0–8.0)
Protein, ur: NEGATIVE mg/dL
SPECIFIC GRAVITY, URINE: 1.009 (ref 1.005–1.030)

## 2018-04-10 LAB — COMPREHENSIVE METABOLIC PANEL
ALT: 16 U/L (ref 0–44)
ANION GAP: 10 (ref 5–15)
AST: 12 U/L — AB (ref 15–41)
Albumin: 3.7 g/dL (ref 3.5–5.0)
Alkaline Phosphatase: 66 U/L (ref 38–126)
BUN: 17 mg/dL (ref 6–20)
CHLORIDE: 106 mmol/L (ref 98–111)
CO2: 24 mmol/L (ref 22–32)
Calcium: 8.9 mg/dL (ref 8.9–10.3)
Creatinine, Ser: 0.94 mg/dL (ref 0.44–1.00)
GFR calc Af Amer: >60 mL/min (ref >60)
GFR calc non Af Amer: >60 mL/min (ref >60)
GLUCOSE: 82 mg/dL (ref 70–99)
POTASSIUM: 3.9 mmol/L (ref 3.5–5.1)
SODIUM: 140 mmol/L (ref 135–145)
Total Bilirubin: 0.5 mg/dL (ref 0.3–1.2)
Total Protein: 6.5 g/dL (ref 6.5–8.1)

## 2018-04-10 LAB — BLOOD GAS, ARTERIAL
ACID-BASE EXCESS: 2.6 mmol/L — AB (ref 0.0–2.0)
Bicarbonate: 26.4 mmol/L (ref 20.0–28.0)
DRAWN BY: 470591
FIO2: 21
O2 Saturation: 97.8 %
PCO2 ART: 39.6 mmHg (ref 32.0–48.0)
PH ART: 7.44 (ref 7.350–7.450)
Patient temperature: 98.6
pO2, Arterial: 100 mmHg (ref 83.0–108.0)

## 2018-04-10 LAB — PROTIME-INR
INR: 0.88
Prothrombin Time: 11.9 seconds (ref 11.4–15.2)

## 2018-04-10 LAB — CBC
HCT: 42.4 % (ref 36.0–46.0)
HEMOGLOBIN: 13.5 g/dL (ref 12.0–15.0)
MCH: 30.1 pg (ref 26.0–34.0)
MCHC: 31.8 g/dL (ref 30.0–36.0)
MCV: 94.4 fL (ref 78.0–100.0)
Platelets: 276 10*3/uL (ref 150–400)
RBC: 4.49 MIL/uL (ref 3.87–5.11)
RDW: 12.6 % (ref 11.5–15.5)
WBC: 9.5 10*3/uL (ref 4.0–10.5)

## 2018-04-10 LAB — TYPE AND SCREEN
ABO/RH(D): B POS
ANTIBODY SCREEN: NEGATIVE

## 2018-04-10 LAB — APTT: aPTT: 23 seconds — ABNORMAL LOW (ref 24–36)

## 2018-04-10 NOTE — Progress Notes (Signed)
PCP - Docia Barrier, PA Cardiologist - Adrian Prows  Chest x-ray - 04/10/2018  EKG - 03/14/18 ECHO - 10/26/17 Cardiac Cath - 10/25/17  Aspirin Instructions: pt has already stopped ASA  Anesthesia review: sent to anesthesia to review.   Patient denies shortness of breath, fever, cough and chest pain at PAT appointment   Patient verbalized understanding of instructions that were given to them at the PAT appointment. Patient was also instructed that they will need to review over the PAT instructions again at home before surgery.

## 2018-04-10 NOTE — Anesthesia Preprocedure Evaluation (Addendum)
Anesthesia Evaluation  Patient identified by MRN, date of birth, ID band Patient awake    Reviewed: Allergy & Precautions, NPO status , Patient's Chart, lab work & pertinent test results  Airway Mallampati: II  TM Distance: >3 FB Neck ROM: Full    Dental  (+) Teeth Intact   Pulmonary asthma , Current Smoker,    breath sounds clear to auscultation       Cardiovascular hypertension, + Past MI and + Peripheral Vascular Disease   Rhythm:Regular Rate:Normal  Normal coronaries , previous event likely coronary spasm   Neuro/Psych Anxiety  Neuromuscular disease    GI/Hepatic GERD  ,  Endo/Other  Hyperthyroidism   Renal/GU      Musculoskeletal   Abdominal (+) + obese,   Peds  Hematology   Anesthesia Other Findings   Reproductive/Obstetrics                            Anesthesia Physical Anesthesia Plan  ASA: III  Anesthesia Plan:    Post-op Pain Management:    Induction: Intravenous  PONV Risk Score and Plan: 3 and Ondansetron, Dexamethasone and Treatment may vary due to age or medical condition  Airway Management Planned: Oral ETT  Additional Equipment: Arterial line  Intra-op Plan:   Post-operative Plan: Post-operative intubation/ventilation  Informed Consent: I have reviewed the patients History and Physical, chart, labs and discussed the procedure including the risks, benefits and alternatives for the proposed anesthesia with the patient or authorized representative who has indicated his/her understanding and acceptance.   Dental advisory given  Plan Discussed with: CRNA  Anesthesia Plan Comments:        Anesthesia Quick Evaluation

## 2018-04-11 ENCOUNTER — Inpatient Hospital Stay (HOSPITAL_COMMUNITY)
Admission: RE | Admit: 2018-04-11 | Discharge: 2018-04-15 | DRG: 804 | Disposition: A | Discharge status: Home / Self Care | Payer: Medicaid Other | Source: Ambulatory Visit | Attending: Thoracic Surgery (Cardiothoracic Vascular Surgery) | Admitting: Thoracic Surgery (Cardiothoracic Vascular Surgery)

## 2018-04-11 ENCOUNTER — Inpatient Hospital Stay (HOSPITAL_COMMUNITY): Payer: Medicaid Other

## 2018-04-11 ENCOUNTER — Encounter (HOSPITAL_COMMUNITY): Payer: Self-pay | Admitting: Urology

## 2018-04-11 ENCOUNTER — Inpatient Hospital Stay (HOSPITAL_COMMUNITY): Payer: Medicaid Other | Admitting: Physician Assistant

## 2018-04-11 ENCOUNTER — Encounter (HOSPITAL_COMMUNITY)
Admission: RE | Disposition: A | Discharge status: Home / Self Care | Payer: Self-pay | Source: Ambulatory Visit | Attending: Thoracic Surgery (Cardiothoracic Vascular Surgery)

## 2018-04-11 DIAGNOSIS — E7800 Pure hypercholesterolemia, unspecified: Secondary | ICD-10-CM | POA: Diagnosis present

## 2018-04-11 DIAGNOSIS — I2510 Atherosclerotic heart disease of native coronary artery without angina pectoris: Secondary | ICD-10-CM | POA: Diagnosis present

## 2018-04-11 DIAGNOSIS — D150 Benign neoplasm of thymus: Principal | ICD-10-CM | POA: Diagnosis present

## 2018-04-11 DIAGNOSIS — I10 Essential (primary) hypertension: Secondary | ICD-10-CM | POA: Diagnosis present

## 2018-04-11 DIAGNOSIS — I252 Old myocardial infarction: Secondary | ICD-10-CM

## 2018-04-11 DIAGNOSIS — I739 Peripheral vascular disease, unspecified: Secondary | ICD-10-CM | POA: Diagnosis present

## 2018-04-11 DIAGNOSIS — Z9089 Acquired absence of other organs: Secondary | ICD-10-CM

## 2018-04-11 DIAGNOSIS — J9859 Other diseases of mediastinum, not elsewhere classified: Secondary | ICD-10-CM

## 2018-04-11 DIAGNOSIS — Z79899 Other long term (current) drug therapy: Secondary | ICD-10-CM | POA: Diagnosis not present

## 2018-04-11 DIAGNOSIS — Z7982 Long term (current) use of aspirin: Secondary | ICD-10-CM | POA: Diagnosis not present

## 2018-04-11 DIAGNOSIS — K219 Gastro-esophageal reflux disease without esophagitis: Secondary | ICD-10-CM | POA: Diagnosis present

## 2018-04-11 DIAGNOSIS — Z90710 Acquired absence of both cervix and uterus: Secondary | ICD-10-CM

## 2018-04-11 DIAGNOSIS — Z23 Encounter for immunization: Secondary | ICD-10-CM

## 2018-04-11 DIAGNOSIS — E042 Nontoxic multinodular goiter: Secondary | ICD-10-CM | POA: Diagnosis present

## 2018-04-11 DIAGNOSIS — D72828 Other elevated white blood cell count: Secondary | ICD-10-CM | POA: Diagnosis present

## 2018-04-11 DIAGNOSIS — F17210 Nicotine dependence, cigarettes, uncomplicated: Secondary | ICD-10-CM | POA: Diagnosis present

## 2018-04-11 DIAGNOSIS — E0590 Thyrotoxicosis, unspecified without thyrotoxic crisis or storm: Secondary | ICD-10-CM | POA: Diagnosis present

## 2018-04-11 DIAGNOSIS — E320 Persistent hyperplasia of thymus: Secondary | ICD-10-CM | POA: Diagnosis present

## 2018-04-11 HISTORY — PX: THYMECTOMY: SHX6121

## 2018-04-11 HISTORY — PX: MEDIASTERNOTOMY: SHX5084

## 2018-04-11 LAB — GLUCOSE, CAPILLARY: GLUCOSE-CAPILLARY: 135 mg/dL — AB (ref 70–99)

## 2018-04-11 SURGERY — MEDIAN STERNOTOMY
Anesthesia: General | Site: Chest

## 2018-04-11 MED ORDER — LACTATED RINGERS IV SOLN
INTRAVENOUS | Status: DC | PRN
  Administered 2018-04-11: 07:00:00 via INTRAVENOUS

## 2018-04-11 MED ORDER — SUGAMMADEX SODIUM 200 MG/2ML IV SOLN
INTRAVENOUS | Status: DC | PRN
  Administered 2018-04-11: 160 mg via INTRAVENOUS

## 2018-04-11 MED ORDER — MORPHINE SULFATE 2 MG/ML IV SOLN
INTRAVENOUS | Status: DC
  Administered 2018-04-11: 25.5 mg via INTRAVENOUS
  Administered 2018-04-11: 6 mg via INTRAVENOUS
  Administered 2018-04-11: 12:00:00 via INTRAVENOUS
  Administered 2018-04-12: 6 mg via INTRAVENOUS
  Administered 2018-04-12: 22 mg via INTRAVENOUS
  Administered 2018-04-12: 04:00:00 via INTRAVENOUS
  Administered 2018-04-12: 18 mg via INTRAVENOUS
  Filled 2018-04-11: qty 30
  Filled 2018-04-11: qty 25
  Filled 2018-04-11: qty 30

## 2018-04-11 MED ORDER — SODIUM CHLORIDE 0.9% FLUSH: 9.0000 mL | INTRAVENOUS | Status: DC | PRN

## 2018-04-11 MED ORDER — NALOXONE HCL 0.4 MG/ML IJ SOLN
0.4000 mg | INTRAMUSCULAR | Status: DC | PRN
  Filled 2018-04-11: qty 1

## 2018-04-11 MED ORDER — PHENYLEPHRINE HCL 10 MG/ML IJ SOLN: INTRAVENOUS | Status: DC | PRN

## 2018-04-11 MED ORDER — POTASSIUM CHLORIDE IN NACL 20-0.45 MEQ/L-% IV SOLN
INTRAVENOUS | Status: DC
  Administered 2018-04-11 – 2018-04-12 (×2): via INTRAVENOUS
  Filled 2018-04-11 (×2): qty 1000

## 2018-04-11 MED ORDER — MIDAZOLAM HCL 5 MG/5ML IJ SOLN
INTRAMUSCULAR | Status: DC | PRN
  Administered 2018-04-11 (×2): 1 mg via INTRAVENOUS

## 2018-04-11 MED ORDER — PROPOFOL 10 MG/ML IV BOLUS
INTRAVENOUS | Status: AC
  Filled 2018-04-11: qty 20

## 2018-04-11 MED ORDER — HYDROMORPHONE HCL 1 MG/ML IJ SOLN
INTRAMUSCULAR | Status: AC
  Filled 2018-04-11: qty 1

## 2018-04-11 MED ORDER — DIPHENHYDRAMINE HCL 50 MG/ML IJ SOLN
INTRAMUSCULAR | Status: DC | PRN
  Administered 2018-04-11: 12.5 mg via INTRAVENOUS

## 2018-04-11 MED ORDER — AMPHETAMINE-DEXTROAMPHETAMINE 5 MG PO TABS
15.0000 mg | ORAL_TABLET | Freq: Two times a day (BID) | ORAL | Status: DC
  Administered 2018-04-12 – 2018-04-15 (×7): 15 mg via ORAL
  Filled 2018-04-11 (×7): qty 3

## 2018-04-11 MED ORDER — CEFAZOLIN SODIUM-DEXTROSE 2-4 GM/100ML-% IV SOLN
2.0000 g | Freq: Three times a day (TID) | INTRAVENOUS | Status: AC
  Administered 2018-04-11 (×2): 2 g via INTRAVENOUS
  Filled 2018-04-11 (×3): qty 100

## 2018-04-11 MED ORDER — SERTRALINE HCL 100 MG PO TABS: 100.0000 mg | ORAL_TABLET | Freq: Every day | ORAL | Status: DC

## 2018-04-11 MED ORDER — SENNOSIDES-DOCUSATE SODIUM 8.6-50 MG PO TABS
1.0000 | ORAL_TABLET | Freq: Every day | ORAL | Status: DC
  Administered 2018-04-11 – 2018-04-13 (×3): 1 via ORAL
  Filled 2018-04-11 (×3): qty 1

## 2018-04-11 MED ORDER — ROCURONIUM BROMIDE 10 MG/ML (PF) SYRINGE
PREFILLED_SYRINGE | INTRAVENOUS | Status: DC | PRN
  Administered 2018-04-11: 50 mg via INTRAVENOUS

## 2018-04-11 MED ORDER — SERTRALINE HCL 50 MG PO TABS: 50.0000 mg | ORAL_TABLET | Freq: Every day | ORAL | Status: DC

## 2018-04-11 MED ORDER — LIDOCAINE 2% (20 MG/ML) 5 ML SYRINGE
INTRAMUSCULAR | Status: DC | PRN
  Administered 2018-04-11: 100 mg via INTRAVENOUS

## 2018-04-11 MED ORDER — ATORVASTATIN CALCIUM 10 MG PO TABS
10.0000 mg | ORAL_TABLET | Freq: Every day | ORAL | Status: DC
  Administered 2018-04-11 – 2018-04-14 (×4): 10 mg via ORAL
  Filled 2018-04-11 (×4): qty 1

## 2018-04-11 MED ORDER — TRIAMCINOLONE ACETONIDE 0.5 % EX OINT
1.0000 "application " | TOPICAL_OINTMENT | Freq: Every day | CUTANEOUS | Status: DC | PRN
  Filled 2018-04-11: qty 15

## 2018-04-11 MED ORDER — DIPHENHYDRAMINE HCL 50 MG/ML IJ SOLN
12.5000 mg | Freq: Four times a day (QID) | INTRAMUSCULAR | Status: DC | PRN
  Filled 2018-04-11: qty 0.25

## 2018-04-11 MED ORDER — MUPIROCIN 2 % EX OINT
1.0000 "application " | TOPICAL_OINTMENT | Freq: Two times a day (BID) | CUTANEOUS | Status: DC
  Administered 2018-04-11 – 2018-04-14 (×8): 1 via NASAL
  Filled 2018-04-11 (×3): qty 22

## 2018-04-11 MED ORDER — POTASSIUM CHLORIDE 10 MEQ/50ML IV SOLN: 10.0000 meq | Freq: Every day | INTRAVENOUS | Status: DC | PRN

## 2018-04-11 MED ORDER — OXYCODONE HCL 5 MG/5ML PO SOLN: 5.0000 mg | Freq: Once | ORAL | Status: DC | PRN

## 2018-04-11 MED ORDER — GABAPENTIN 400 MG PO CAPS
800.0000 mg | ORAL_CAPSULE | Freq: Three times a day (TID) | ORAL | Status: DC
  Administered 2018-04-11 – 2018-04-15 (×12): 800 mg via ORAL
  Filled 2018-04-11 (×12): qty 2

## 2018-04-11 MED ORDER — PHENYLEPHRINE 40 MCG/ML (10ML) SYRINGE FOR IV PUSH (FOR BLOOD PRESSURE SUPPORT)
PREFILLED_SYRINGE | INTRAVENOUS | Status: DC | PRN
  Administered 2018-04-11: 80 ug via INTRAVENOUS

## 2018-04-11 MED ORDER — AMLODIPINE BESYLATE 10 MG PO TABS
10.0000 mg | ORAL_TABLET | Freq: Every day | ORAL | Status: DC
Start: 2018-04-12 — End: 2018-04-15
  Administered 2018-04-12 – 2018-04-15 (×4): 10 mg via ORAL
  Filled 2018-04-11 (×4): qty 1

## 2018-04-11 MED ORDER — FENTANYL CITRATE (PF) 250 MCG/5ML IJ SOLN
INTRAMUSCULAR | Status: DC | PRN
  Administered 2018-04-11: 75 ug via INTRAVENOUS
  Administered 2018-04-11: 50 ug via INTRAVENOUS
  Administered 2018-04-11: 75 ug via INTRAVENOUS
  Administered 2018-04-11: 50 ug via INTRAVENOUS

## 2018-04-11 MED ORDER — ENOXAPARIN SODIUM 40 MG/0.4ML ~~LOC~~ SOLN
40.0000 mg | Freq: Every day | SUBCUTANEOUS | Status: DC
  Administered 2018-04-12 – 2018-04-14 (×3): 40 mg via SUBCUTANEOUS
  Filled 2018-04-11 (×3): qty 0.4

## 2018-04-11 MED ORDER — CYCLOSPORINE 0.05 % OP EMUL
1.0000 [drp] | Freq: Two times a day (BID) | OPHTHALMIC | Status: DC | PRN
  Filled 2018-04-11: qty 1

## 2018-04-11 MED ORDER — GLYCOPYRROLATE 0.2 MG/ML IJ SOLN
INTRAMUSCULAR | Status: DC | PRN
  Administered 2018-04-11: 0.2 mg via INTRAVENOUS

## 2018-04-11 MED ORDER — DEXMEDETOMIDINE HCL IN NACL 200 MCG/50ML IV SOLN
INTRAVENOUS | Status: DC | PRN
  Administered 2018-04-11: 0.4 ug/kg/h via INTRAVENOUS

## 2018-04-11 MED ORDER — PROPOFOL 10 MG/ML IV BOLUS
INTRAVENOUS | Status: DC | PRN
  Administered 2018-04-11: 150 mg via INTRAVENOUS

## 2018-04-11 MED ORDER — METOPROLOL TARTRATE 25 MG PO TABS
25.0000 mg | ORAL_TABLET | Freq: Every day | ORAL | Status: DC
  Administered 2018-04-12 – 2018-04-15 (×4): 25 mg via ORAL
  Filled 2018-04-11 (×4): qty 1

## 2018-04-11 MED ORDER — DIPHENHYDRAMINE HCL 12.5 MG/5ML PO ELIX
12.5000 mg | ORAL_SOLUTION | Freq: Four times a day (QID) | ORAL | Status: DC | PRN
  Filled 2018-04-11: qty 5

## 2018-04-11 MED ORDER — CEFAZOLIN SODIUM-DEXTROSE 2-4 GM/100ML-% IV SOLN
2.0000 g | INTRAVENOUS | Status: AC
  Administered 2018-04-11: 2 g via INTRAVENOUS
  Filled 2018-04-11: qty 100

## 2018-04-11 MED ORDER — HEMOSTATIC AGENTS (NO CHARGE) OPTIME
TOPICAL | Status: DC | PRN
  Administered 2018-04-11: 1 via TOPICAL

## 2018-04-11 MED ORDER — ALBUTEROL SULFATE (2.5 MG/3ML) 0.083% IN NEBU: 3.0000 mL | INHALATION_SOLUTION | Freq: Four times a day (QID) | RESPIRATORY_TRACT | Status: DC | PRN

## 2018-04-11 MED ORDER — ISOSORBIDE MONONITRATE ER 30 MG PO TB24
30.0000 mg | ORAL_TABLET | Freq: Every day | ORAL | Status: DC
  Administered 2018-04-12 – 2018-04-15 (×4): 30 mg via ORAL
  Filled 2018-04-11 (×4): qty 1

## 2018-04-11 MED ORDER — BUPROPION HCL ER (XL) 150 MG PO TB24
150.0000 mg | ORAL_TABLET | Freq: Every day | ORAL | Status: DC
  Administered 2018-04-12 – 2018-04-15 (×4): 150 mg via ORAL
  Filled 2018-04-11 (×4): qty 1

## 2018-04-11 MED ORDER — FENTANYL CITRATE (PF) 250 MCG/5ML IJ SOLN
INTRAMUSCULAR | Status: AC
  Filled 2018-04-11: qty 5

## 2018-04-11 MED ORDER — PHENYLEPHRINE HCL 10 MG/ML IJ SOLN
INTRAVENOUS | Status: DC | PRN
  Administered 2018-04-11: 30 ug/min via INTRAVENOUS

## 2018-04-11 MED ORDER — ACETAMINOPHEN 160 MG/5ML PO SOLN
1000.0000 mg | Freq: Four times a day (QID) | ORAL | Status: DC
  Administered 2018-04-11: 1000 mg via ORAL
  Filled 2018-04-11 (×2): qty 40.6

## 2018-04-11 MED ORDER — ASPIRIN 325 MG PO TABS
325.0000 mg | ORAL_TABLET | Freq: Every day | ORAL | Status: DC
  Administered 2018-04-12 – 2018-04-15 (×4): 325 mg via ORAL
  Filled 2018-04-11 (×4): qty 1

## 2018-04-11 MED ORDER — OXYCODONE HCL 5 MG PO TABS: 5.0000 mg | ORAL_TABLET | Freq: Once | ORAL | Status: DC | PRN

## 2018-04-11 MED ORDER — BISACODYL 5 MG PO TBEC
10.0000 mg | DELAYED_RELEASE_TABLET | Freq: Every day | ORAL | Status: DC
  Administered 2018-04-12 – 2018-04-15 (×4): 10 mg via ORAL
  Filled 2018-04-11 (×4): qty 2

## 2018-04-11 MED ORDER — SERTRALINE HCL 100 MG PO TABS
150.0000 mg | ORAL_TABLET | Freq: Every day | ORAL | Status: DC
  Administered 2018-04-12 – 2018-04-15 (×4): 150 mg via ORAL
  Filled 2018-04-11 (×4): qty 1

## 2018-04-11 MED ORDER — DEXAMETHASONE SODIUM PHOSPHATE 10 MG/ML IJ SOLN
INTRAMUSCULAR | Status: DC | PRN
  Administered 2018-04-11: 10 mg via INTRAVENOUS

## 2018-04-11 MED ORDER — FUROSEMIDE 20 MG PO TABS
20.0000 mg | ORAL_TABLET | Freq: Every day | ORAL | Status: DC
  Administered 2018-04-12 – 2018-04-15 (×4): 20 mg via ORAL
  Filled 2018-04-11 (×4): qty 1

## 2018-04-11 MED ORDER — NICOTINE 14 MG/24HR TD PT24
14.0000 mg | MEDICATED_PATCH | Freq: Every day | TRANSDERMAL | Status: DC
  Administered 2018-04-12 – 2018-04-15 (×4): 14 mg via TRANSDERMAL
  Filled 2018-04-11 (×4): qty 1

## 2018-04-11 MED ORDER — 0.9 % SODIUM CHLORIDE (POUR BTL) OPTIME
TOPICAL | Status: DC | PRN
  Administered 2018-04-11: 2000 mL

## 2018-04-11 MED ORDER — ACETAMINOPHEN 500 MG PO TABS
1000.0000 mg | ORAL_TABLET | Freq: Four times a day (QID) | ORAL | Status: DC
  Administered 2018-04-11 – 2018-04-15 (×15): 1000 mg via ORAL
  Filled 2018-04-11 (×15): qty 2

## 2018-04-11 MED ORDER — HYDROMORPHONE HCL 1 MG/ML IJ SOLN
0.2500 mg | INTRAMUSCULAR | Status: DC | PRN
  Administered 2018-04-11: 0.5 mg via INTRAVENOUS

## 2018-04-11 MED ORDER — MIDAZOLAM HCL 2 MG/2ML IJ SOLN
INTRAMUSCULAR | Status: AC
  Filled 2018-04-11: qty 2

## 2018-04-11 MED ORDER — PNEUMOCOCCAL VAC POLYVALENT 25 MCG/0.5ML IJ INJ
0.5000 mL | INJECTION | INTRAMUSCULAR | Status: AC
  Administered 2018-04-15: 0.5 mL via INTRAMUSCULAR
  Filled 2018-04-11: qty 0.5

## 2018-04-11 SURGICAL SUPPLY — 51 items
BLADE SURG 11 STRL SS (BLADE) IMPLANT
CANISTER SUCT 3000ML PPV (MISCELLANEOUS) ×3 IMPLANT
CATH THORACIC 28FR (CATHETERS) IMPLANT
CATH THORACIC 28FR RT ANG (CATHETERS) IMPLANT
CLIP VESOCCLUDE MED 24/CT (CLIP) ×2 IMPLANT
CLIP VESOCCLUDE SM WIDE 24/CT (CLIP) ×2 IMPLANT
CONN ST 1/4X3/8  BEN (MISCELLANEOUS) ×2
CONN ST 1/4X3/8 BEN (MISCELLANEOUS) IMPLANT
CONT SPEC 4OZ CLIKSEAL STRL BL (MISCELLANEOUS) ×5 IMPLANT
COVER SURGICAL LIGHT HANDLE (MISCELLANEOUS) ×2 IMPLANT
DRAIN CHANNEL 28F RND 3/8 FF (WOUND CARE) ×2 IMPLANT
DRAPE LAPAROSCOPIC ABDOMINAL (DRAPES) ×3 IMPLANT
DRSG AQUACEL AG ADV 3.5X14 (GAUZE/BANDAGES/DRESSINGS) ×2 IMPLANT
DRSG COVADERM 4X14 (GAUZE/BANDAGES/DRESSINGS) ×2 IMPLANT
ELECT BLADE 4.0 EZ CLEAN MEGAD (MISCELLANEOUS) ×3
ELECT REM PT RETURN 9FT ADLT (ELECTROSURGICAL) ×3
ELECTRODE BLDE 4.0 EZ CLN MEGD (MISCELLANEOUS) IMPLANT
ELECTRODE REM PT RTRN 9FT ADLT (ELECTROSURGICAL) ×1 IMPLANT
GAUZE SPONGE 4X4 12PLY STRL (GAUZE/BANDAGES/DRESSINGS) ×3 IMPLANT
GAUZE SPONGE 4X4 12PLY STRL LF (GAUZE/BANDAGES/DRESSINGS) ×2 IMPLANT
GEL ULTRASOUND 20GR AQUASONIC (MISCELLANEOUS) ×3 IMPLANT
GLOVE SURG SIGNA 7.5 PF LTX (GLOVE) ×3 IMPLANT
GOWN STRL REUS W/ TWL LRG LVL3 (GOWN DISPOSABLE) ×2 IMPLANT
GOWN STRL REUS W/ TWL XL LVL3 (GOWN DISPOSABLE) ×1 IMPLANT
GOWN STRL REUS W/TWL LRG LVL3 (GOWN DISPOSABLE) ×6
GOWN STRL REUS W/TWL XL LVL3 (GOWN DISPOSABLE) ×3
HEMOSTAT ARISTA ABSORB 3G PWDR (MISCELLANEOUS) ×2 IMPLANT
HEMOSTAT POWDER SURGIFOAM 1G (HEMOSTASIS) IMPLANT
KIT BASIN OR (CUSTOM PROCEDURE TRAY) ×3 IMPLANT
KIT SUCTION CATH 14FR (SUCTIONS) IMPLANT
KIT TURNOVER KIT B (KITS) ×3 IMPLANT
NS IRRIG 1000ML POUR BTL (IV SOLUTION) ×6 IMPLANT
PACK CHEST (CUSTOM PROCEDURE TRAY) ×3 IMPLANT
PAD ARMBOARD 7.5X6 YLW CONV (MISCELLANEOUS) ×6 IMPLANT
SPONGE TONSIL 1 RF SGL (DISPOSABLE) ×2 IMPLANT
SUT PROLENE 4 0 RB 1 (SUTURE)
SUT PROLENE 4-0 RB1 .5 CRCL 36 (SUTURE) IMPLANT
SUT SILK  1 MH (SUTURE) ×2
SUT SILK 1 MH (SUTURE) IMPLANT
SUT SILK 2 0 SH CR/8 (SUTURE) ×3 IMPLANT
SUT STEEL 6MS V (SUTURE) ×3 IMPLANT
SUT STEEL SZ 6 DBL 3X14 BALL (SUTURE) ×2 IMPLANT
SUT VIC AB 1 CTX 27 (SUTURE) ×8 IMPLANT
SUT VIC AB 2-0 CTX 36 (SUTURE) ×6 IMPLANT
SUT VIC AB 3-0 X1 27 (SUTURE) ×6 IMPLANT
SYSTEM SAHARA CHEST DRAIN RE-I (WOUND CARE) ×3 IMPLANT
TAPE CLOTH SURG 4X10 WHT LF (GAUZE/BANDAGES/DRESSINGS) ×2 IMPLANT
TOWEL GREEN STERILE (TOWEL DISPOSABLE) ×3 IMPLANT
TOWEL GREEN STERILE FF (TOWEL DISPOSABLE) ×3 IMPLANT
TRAY FOLEY MTR SLVR 14FR STAT (SET/KITS/TRAYS/PACK) ×3 IMPLANT
WATER STERILE IRR 1000ML POUR (IV SOLUTION) ×3 IMPLANT

## 2018-04-11 NOTE — Op Note (Signed)
NAMESHEKELA, GOODRIDGE MEDICAL RECORD [id number] ACCOUNT [id number] DATE OF BIRTH:1966-09-19 FACILITY: [facility] LOCATION: [facility] PHYSICIAN:STEVEN Chaya Jan, MD  OPERATIVE REPORT  DATE OF PROCEDURE:  04/11/2018  PREOPERATIVE DIAGNOSIS:  Anterior mediastinal cyst.  POSTOPERATIVE DIAGNOSIS:  Anterior mediastinal cyst.  PROCEDURE:  Median sternotomy, thymectomy.  SURGEON:  Modesto Charon, MD  ASSISTANT:  Ara Kussmaul, RNFA  ANESTHESIA:  General.  FINDINGS:  Approximately 5 cm thin-walled cyst with adjacent hyperplastic thymus.  CLINICAL NOTE:  The patient is a 52 year old woman who was found to have an abnormal chest x-ray.  A CT of the chest showed a 4.5 x 3.5 x 6.4 cm left-sided anterior mediastinal mass.  This was present on a previous scan from about a year earlier and was  felt to be a simple cyst.  A PET CT showed no activity.  An attempt was made to do a CT-guided aspiration of the cyst, but that was unsuccessful.  There was thymic tissue seen.  The options of continued observation versus surgical resection were  discussed in detail with the patient.  She wished to proceed with removal.  The indications, risks, benefits, and alternatives were discussed in detail with the patient.  She accepted the risks and agreed to proceed.  OPERATIVE NOTE:  The patient was brought to the operating room on 04/11/2018.  She had induction of general anesthesia and was intubated.  A Foley catheter was placed.  Sequential compression devices were placed for DVT prophylaxis.  Intravenous  antibiotics were administered.  The chest, abdomen and legs were prepped and draped in the usual sterile fashion.  A timeout was performed.  A median sternotomy then was performed.  A retractor was placed and gently opened over time.  Dissection was started along the right inferior pole of the anterior mediastinal fat pad.  There was no invasion of the pericardium.   The thymus along this area was relatively  well defined consistent with thymic hyperplasia.  Care was taken not to use cautery in the vicinity of the phrenic nerve.  The dissection was carried upward to the neck to the upper pole of the thymus.  Larger  blood vessels were doubly clipped and divided.  The dissection then was carried out from right to left.  The left upper pole was mobilized, and as the thymus was taken off the innominate vein, the cyst was visualized.  This was a large simple- appearing  cyst.  It was thin-walled.  It extended into the left chest.  The left pleura was not violated.  Care was taken to visualize and preserve the left phrenic nerve, and no cautery was used in the vicinity of the phrenic nerve.  As the final most posterior  aspect of the cyst was being mobilized, there was a small puncture in the cyst and clear fluid was evacuated.  The cyst and thymus then were removed and sent for permanent pathology.  Inspection was made for hemostasis.  The chest was copiously irrigated  with warm saline.  A 28-French Blake drain was placed through a separate subcostal incision and placed into the anterior mediastinum.  The incision then was closed.  The sternum was reapproximated with a combination of single and double interrupted  heavy gauge stainless steel wires.  The pectoralis fascia, subcutaneous tissue, and skin were closed in standard fashion.  The patient was extubated in the operating room and taken to the [facility] in good condition.  LN/NUANCE  D:04/11/2018 T:04/11/2018 JOB:[id number]

## 2018-04-11 NOTE — Transfer of Care (Signed)
Immediate Anesthesia Transfer of Care Note  Patient: Amanda Cardenas  Procedure(s) Performed: MEDIAN STERNOTOMY (N/A Chest) THYMECTOMY (N/A Chest)  Patient Location: PACU  Anesthesia Type:General  Level of Consciousness: drowsy  Airway & Oxygen Therapy: Patient Spontanous Breathing and Patient connected to nasal cannula oxygen  Post-op Assessment: Report given to RN and Post -op Vital signs reviewed and stable  Post vital signs: Reviewed and stable  Last Vitals:  Vitals Value Taken Time  BP 156/93 04/11/2018 10:14 AM  Temp    Pulse 61 04/11/2018 10:15 AM  Resp 14 04/11/2018 10:15 AM  SpO2 96 % 04/11/2018 10:15 AM  Vitals shown include unvalidated device data.  Last Pain:  Vitals:   04/11/18 0634  TempSrc:   PainSc: 0-No pain         Complications: No apparent anesthesia complications

## 2018-04-11 NOTE — Anesthesia Procedure Notes (Addendum)
Procedure Name: Intubation Date/Time: 04/11/2018 8:08 AM Performed by: Imagene Riches, CRNA Pre-anesthesia Checklist: Patient identified, Emergency Drugs available, Suction available and Patient being monitored Patient Re-evaluated:Patient Re-evaluated prior to induction Oxygen Delivery Method: Circle System Utilized Preoxygenation: Pre-oxygenation with 100% oxygen Induction Type: IV induction Ventilation: Mask ventilation without difficulty Laryngoscope Size: Miller and 2 Grade View: Grade I Tube type: Oral Tube size: 7.0 mm Number of attempts: 1 Airway Equipment and Method: Stylet Placement Confirmation: ETT inserted through vocal cords under direct vision,  positive ETCO2 and breath sounds checked- equal and bilateral Secured at: 20 cm Tube secured with: Tape Dental Injury: Teeth and Oropharynx as per pre-operative assessment

## 2018-04-11 NOTE — Anesthesia Procedure Notes (Addendum)
Arterial Line Insertion Start/End7/08/2018 7:05 AM, 04/11/2018 7:20 AM Performed by: Imagene Riches, CRNA, CRNA  Patient location: Pre-op. Preanesthetic checklist: patient identified, IV checked, site marked, risks and benefits discussed, surgical consent, monitors and equipment checked, pre-op evaluation, timeout performed and anesthesia consent Lidocaine 1% used for infiltration and patient sedated Right, radial was placed Catheter size: 20 G Hand hygiene performed , maximum sterile barriers used  and Seldinger technique used Allen's test indicative of satisfactory collateral circulation Attempts: 3 (2 Attempts by NP SRNA) Procedure performed without using ultrasound guided technique. Following insertion, Biopatch and dressing applied. Patient tolerated the procedure well with no immediate complications. Additional procedure comments: Two attempts on left arm, unable to thread wire. Marland Kitchen

## 2018-04-11 NOTE — Brief Op Note (Signed)
04/11/2018  10:10 AM  PATIENT:  Amanda Cardenas  52 y.o. female  PRE-OPERATIVE DIAGNOSIS:  Anterior Mediastinal Mass  POST-OPERATIVE DIAGNOSIS:  Anterior Mediastinal Mass- Thymic Cyst  PROCEDURE:  Procedure(s): MEDIAN STERNOTOMY (N/A) THYMECTOMY (N/A)  SURGEON:  Surgeon(s) and Role:    * [name], MD - Primary  PHYSICIAN ASSISTANT:   ASSISTANTS: Ara Kussmaul, RNFA   ANESTHESIA:   general  EBL:  200 mL   BLOOD ADMINISTERED:none  DRAINS: 1 Chest Tube(s) in the mediastinum'   LOCAL MEDICATIONS USED:  NONE  SPECIMEN:  Thymus  DISPOSITION OF SPECIMEN:  PATHOLOGY  COUNTS:  YES  TOURNIQUET:  * No tourniquets in log *  DICTATION: .Other Dictation: Dictation Number -  PLAN OF CARE: Admit to inpatient   PATIENT DISPOSITION:  PACU - hemodynamically stable.   Delay start of Pharmacological VTE agent (>24hrs) due to surgical blood loss or risk of bleeding: no

## 2018-04-11 NOTE — Interval H&P Note (Signed)
History and Physical Interval Note:  04/11/2018 7:50 AM  Amanda Cardenas  has presented today for surgery, with the diagnosis of Anterior Mediastinal Mass  The various methods of treatment have been discussed with the patient and family. After consideration of risks, benefits and other options for treatment, the patient has consented to  Procedure(s): MEDIAN STERNOTOMY (N/A) THYMECTOMY (N/A) as a surgical intervention .  The patient's history has been reviewed, patient examined, no change in status, stable for surgery.  I have reviewed the patient's chart and labs.  Questions were answered to the patient's satisfaction.     [name]

## 2018-04-12 ENCOUNTER — Encounter (HOSPITAL_COMMUNITY): Payer: Self-pay | Admitting: Thoracic Surgery (Cardiothoracic Vascular Surgery)

## 2018-04-12 ENCOUNTER — Inpatient Hospital Stay (HOSPITAL_COMMUNITY): Payer: Medicaid Other

## 2018-04-12 LAB — BASIC METABOLIC PANEL
ANION GAP: 9 (ref 5–15)
BUN: 13 mg/dL (ref 6–20)
CO2: 27 mmol/L (ref 22–32)
Calcium: 9.4 mg/dL (ref 8.9–10.3)
Chloride: 105 mmol/L (ref 98–111)
Creatinine, Ser: 0.8 mg/dL (ref 0.44–1.00)
GFR calc Af Amer: >60 mL/min (ref >60)
Glucose, Bld: 125 mg/dL — ABNORMAL HIGH (ref 70–99)
POTASSIUM: 4.4 mmol/L (ref 3.5–5.1)
Sodium: 141 mmol/L (ref 135–145)

## 2018-04-12 LAB — CBC
HCT: 44.8 % (ref 36.0–46.0)
Hemoglobin: 14.2 g/dL (ref 12.0–15.0)
MCH: 29.5 pg (ref 26.0–34.0)
MCHC: 31.7 g/dL (ref 30.0–36.0)
MCV: 92.9 fL (ref 78.0–100.0)
PLATELETS: 369 10*3/uL (ref 150–400)
RBC: 4.82 MIL/uL (ref 3.87–5.11)
RDW: 12.6 % (ref 11.5–15.5)
WBC: 21.1 10*3/uL — AB (ref 4.0–10.5)

## 2018-04-12 MED ORDER — OXYCODONE HCL 5 MG PO TABS
20.0000 mg | ORAL_TABLET | ORAL | Status: DC | PRN
  Administered 2018-04-12 – 2018-04-14 (×8): 20 mg via ORAL
  Filled 2018-04-12 (×10): qty 4

## 2018-04-12 NOTE — Anesthesia Postprocedure Evaluation (Signed)
Anesthesia Post Note  Patient: Amanda Cardenas  Procedure(s) Performed: MEDIAN STERNOTOMY (N/A Chest) THYMECTOMY (N/A Chest)     Patient location during evaluation: PACU Anesthesia Type: General Level of consciousness: awake, sedated and patient cooperative Pain management: pain level controlled Vital Signs Assessment: post-procedure vital signs reviewed and stable Respiratory status: spontaneous breathing, nonlabored ventilation, respiratory function stable and patient connected to nasal cannula oxygen Cardiovascular status: blood pressure returned to baseline and stable Postop Assessment: no apparent nausea or vomiting Anesthetic complications: no    Last Vitals:  Vitals:   04/12/18 0820 04/12/18 0823  BP:  (!) 160/117  Pulse:  85  Resp: (!) 22 (!) 21  Temp:  36.5 C  SpO2: 97% 96%    Last Pain:  Vitals:   04/12/18 0823  TempSrc: Oral  PainSc:                  Nevea Spiewak,JAMES TERRILL

## 2018-04-12 NOTE — Progress Notes (Signed)
23mL Morphine from PCA wasted with Van Clines, RN.

## 2018-04-12 NOTE — Care Management Note (Signed)
Case Management Note Marvetta Gibbons RN, BSN Unit 4E-Case Manager-- Cross coverage 2C [phone]  Patient Details  Name: Amanda Cardenas MRN: [id number] Date of Birth: 12/22/1965  Subjective/Objective:  Pt admitted s/p MEDIAN STERNOTOMY, THYMECTOMY                 Action/Plan: PTA pt lived at home, independent, anticipate return home. PCP- Docia Barrier. CM to follow for transition of care needs  Expected Discharge Date:                  Expected Discharge Plan:  Home/Self Care  In-House Referral:     Discharge planning Services  CM Consult  Post Acute Care Choice:    Choice offered to:     DME Arranged:    DME Agency:     HH Arranged:    HH Agency:     Status of Service:  In process, will continue to follow  If discussed at Long Length of Stay Meetings, dates discussed:    Discharge Disposition:   Additional Comments:  Dawayne Patricia, RN 04/12/2018, 10:43 AM

## 2018-04-12 NOTE — Discharge Instructions (Signed)
Do not resume smoking cigarettes or any other tobacco products.  Discharge Instructions:  1. You may shower, please wash incisions daily with soap and water and keep dry.  If you wish to cover wounds with dressing you may do so but please keep clean and change daily.  No tub baths or swimming until incisions have completely healed.  If your incisions become red or develop any drainage please call our office at [phone]  2. No Driving until cleared by surgeons office and you are no longer using narcotic pain medications  3. Monitor your weight daily.. Please use the same scale and weigh at same time... If you gain 5-10 lbs in 48 hours with associated lower extremity swelling, please contact our office at [phone]  4. Fever of 101.5 for at least 24 hours with no source, please contact our office at [phone]  5. Activity- up as tolerated, please walk at least 3 times per day.  Avoid strenuous activity, no lifting, pushing, or pulling with your arms over 8-10 lbs for a minimum of 6 weeks  6. If any questions or concerns arise, please do not hesitate to contact our office at [phone]

## 2018-04-12 NOTE — Progress Notes (Addendum)
[address]       [place] [address]             [phone]      1 Day Post-Op Procedure(s) (LRB): MEDIAN STERNOTOMY (N/A) THYMECTOMY (N/A) Subjective: Feels reasonably well, mildly productive cough  Objective: Vital signs in last 24 hours: Temp:  [97.3 F (36.3 C)-98.5 F (36.9 C)] 97.5 F (36.4 C) (07/12 0257) Pulse Rate:  [58-107] 103 (07/12 0257) Cardiac Rhythm: Sinus tachycardia (07/11 2152) Resp:  [11-24] 12 (07/12 0433) BP: (123-161)/(89-119) 126/110 (07/12 0257) SpO2:  [88 %-96 %] 88 % (07/12 0433) Arterial Line BP: (91-135)/(81-126) 91/81 (07/11 2339) Weight:  [79.3 kg (174 lb 13.2 oz)] 79.3 kg (174 lb 13.2 oz) (07/11 2205)  Hemodynamic parameters for last 24 hours:    Intake/Output from previous day: 07/11 0701 - 07/12 0700 In: 4145.8 [P.O.:1380; I.V.:2563.3; IV Piggyback:182.5] Out: 5437 [Urine:5070; Blood:200; Chest Tube:167] Intake/Output this shift: No intake/output data recorded.  General appearance: alert, cooperative and no distress Heart: regular rate and rhythm Lungs: inspiratory pseudowheeze Abdomen: benign Extremities: no edema Wound: dressing intact  Lab Results: Recent Labs    04/10/18 1035 04/12/18 0228  WBC 9.5 21.1*  HGB 13.5 14.2  HCT 42.4 44.8  PLT 276 369   BMET:  Recent Labs    04/10/18 1035 04/12/18 0228  NA 140 141  K 3.9 4.4  CL 106 105  CO2 24 27  GLUCOSE 82 125*  BUN 17 13  CREATININE 0.94 0.80  CALCIUM 8.9 9.4    PT/INR:  Recent Labs    04/10/18 1035  LABPROT 11.9  INR 0.88   ABG    Component Value Date/Time   PHART 7.440 04/10/2018 1036   HCO3 26.4 04/10/2018 1036   TCO2 30 04/24/2013 0557   O2SAT 97.8 04/10/2018 1036   CBG (last 3)  Recent Labs    04/11/18 1233  GLUCAP 135*    Meds Scheduled Meds: . acetaminophen  1,000 mg Oral Q6H   Or  . acetaminophen (TYLENOL) oral liquid 160 mg/5 mL  1,000 mg Oral Q6H  . amLODipine  10 mg Oral Daily  .  amphetamine-dextroamphetamine  15 mg Oral BID WC  . aspirin  325 mg Oral Daily  . atorvastatin  10 mg Oral q1800  . bisacodyl  10 mg Oral Daily  . buPROPion  150 mg Oral Daily  . enoxaparin (LOVENOX) injection  40 mg Subcutaneous Daily  . furosemide  20 mg Oral Daily  . gabapentin  800 mg Oral TID  . isosorbide mononitrate  30 mg Oral Daily  . metoprolol tartrate  25 mg Oral Daily  . morphine   Intravenous Q4H  . mupirocin ointment  1 application Nasal BID  . nicotine  14 mg Transdermal Daily  . pneumococcal 23 valent vaccine  0.5 mL Intramuscular Tomorrow-1000  . senna-docusate  1 tablet Oral QHS  . sertraline  150 mg Oral Daily   Continuous Infusions: . 0.45 % NaCl with KCl 20 mEq / L 100 mL/hr at 04/12/18 0400  . potassium chloride     PRN Meds:.albuterol, cycloSPORINE, diphenhydrAMINE **OR** diphenhydrAMINE, naloxone **AND** sodium chloride flush, oxyCODONE, potassium chloride, triamcinolone ointment  Xrays Dg Chest 2 View  Result Date: 04/10/2018 CLINICAL DATA:  52 y/o  F; chest surgery. EXAM: CHEST - 2 VIEW COMPARISON:  03/13/2018 PET-CT. FINDINGS: Normal cardiac silhouette. Moderate hiatal hernia. Left anterior mediastinal lesion as characterized on prior PET-CT. Clear lungs. No pleural effusion  or pneumothorax. Bones are unremarkable. IMPRESSION: 1. No acute pulmonary process. 2. Left anterior mediastinum lesion better characterized on prior PET-CT. 3. Moderate hiatal hernia. Electronically Signed   By: Kristine Garbe M.D.   On: 04/10/2018 14:55   Dg Chest Port 1 View  Result Date: 04/11/2018 CLINICAL DATA:  Thymectomy EXAM: PORTABLE CHEST 1 VIEW COMPARISON:  04/10/2018 FINDINGS: Median sternotomy.  Mediastinal drain in place. Hypoventilation with low lung volumes and bibasilar atelectasis. Negative for pneumothorax. Small left pleural effusion. IMPRESSION: Mild bibasilar atelectasis. Small left pleural effusion. Negative for pneumothorax. Electronically Signed   By:  Franchot Gallo M.D.   On: 04/11/2018 10:41    Assessment/Plan: S/P Procedure(s) (LRB): MEDIAN STERNOTOMY (N/A) THYMECTOMY (N/A)  1 overall doing well 2 hypertensive , home meds have been restarted- follow closely 3 sinus rhythm/ sinus tachy- monitor 4 sats good on 2 liters- push IS/pulm toilet per routine, cont nebs 5 likely reactive leukocytosis with WBC 21,000, no fevers- observe clinically 6 not amemic 7 renal fxn normal, excellent UOP 8 min CT drainage, no air leak- d/c tube 9 d/c pca 10 d/c foley/art line 11 lovenox for VTE proph 12 advance diet  LOS: 1 day    John Giovanni 04/12/2018 Patient seen and examined, agree with above Leukocytosis- unknown etiology, likely reactive- follow Dc chest drain Would rather have Po oxycodone than PCA- was on 15 mg Q4 PRN at home. Will increase to 20 mg Q4 PRN for pain while in hospital  Paylin Hailu C. Roxan Hockey, MD [facility] [phone]

## 2018-04-12 NOTE — Discharge Summary (Addendum)
Physician Discharge Summary  Patient ID: Amanda Cardenas MRN: [id number] DOB/AGE: [birth year] 52 y.o.  Admit date: 04/11/2018 Discharge date: 04/15/2018  Admission Diagnoses: Anterior mediastinal cyst  Discharge Diagnoses: Thymic Cyst Active Problems:   S/P thymectomy   Patient Active Problem List   Diagnosis Date Noted  . S/P thymectomy 04/11/2018  . Acid reflux   . Migraines   . Barrett's esophagus 02/05/2018  . Chronic bilateral low back pain with bilateral sciatica 02/05/2018  . Colon polyps 02/05/2018  . Hypercholesterolemia 02/05/2018  . Vitamin D deficiency 02/05/2018  . Asthma 01/29/2018  . Bulging of thoracic intervertebral disc 01/29/2018  . Multinodular goiter 01/29/2018  . Sciatica 01/29/2018  . Solitary kidney 01/29/2018  . NSTEMI (non-ST elevated myocardial infarction) ([facility] [place]) 10/25/2017  . Chronic venous insufficiency 05/16/2017  . Varicose veins of left lower extremity with complications 23/76/2831  . Abnormal thyroid ultrasound 02/08/2017  . H/O colonoscopy 08/02/2016  . Anxiety state 09/23/2009  . Nonspecific (abnormal) findings on radiological and other examination of genitourinary organs 09/23/2009  . Nonspecific (abnormal) findings on radiological and other examination of abdominal area, including retroperitoneum 09/03/2009  . Injury, other and unspecified, finger 08/05/2009   History of present illness:  The patient is a 52 year old female with a history of multiple medical comorbidities who was referred to Dr. Roxan Hockey for surgical opinion regarding a anterior mediastinal mass.  She recently had a chest x-ray which showed a prominent left hilum.  A CT scan of the chest showed a 4.5 x 3.5 x 6.4 cm left sided anterior mediastinal "mass".  The CT was done at [facility].  She had a CT angios in March 2018, which also showed the abnormality.  There was felt on that scan to be a pericardial cyst.  A PET CT scan was obtained to further assess the  finding and it showed a "mass" to be a simple cyst with no metabolic activity.  A CT-guided biopsy was done by radiology and pathology revealed benign thymic mass.  And follow-up with Dr. Roxan Hockey he recommended proceeding with surgical resection and she was admitted this hospitalization for the procedure.   Discharged Condition: good  Hospital Course: The patient was admitted electively for surgical resection and on 04/11/2018 was taken to the operating room where she underwent the below described procedure.  She tolerated well was taken to the postanesthesia care unit in stable condition.  Post operative hospital course:  Patient has remained quite stable.  All routine lines, monitors and drainage devices were discontinued in standard fashion.  He initially did have a acute leukocytosis with white blood cell count of 21,000 but this has returned to 13.4 and she has not had any fevers.  She does not have an anemia postoperatively.  Renal function has remained within normal limits.  Incisions are noted to be healing well without evidence of infection.  She is tolerating gradually increasing activity using standard protocols.  Oxygen was weaned and she maintains good saturations on room air.  She is tolerating diet.  Neurologist noted below.  At the time of discharge the patient is felt to be quite stable.  Consults: None  Significant Diagnostic Studies: Routine postoperative serial chest x-rays and labs.  She has been hypertensive and her home medications have been resumed and this has been followed clinically.  She has required aggressive pulmonary toilet including nebulizers and incentive spirometry.  He initially did have a reactive leukocytosis with a white blood cell count of 21,000.  She has not  had any fevers.  She has been observed clinically in this regard.  She does not have a postoperative anemia.  Renal function has remained normal.  All routine lines, monitors and drainage devices have  been discontinued in the standard fashion.  The chest tube was removed on postoperative day #1.  Incisions are noted to be healing well without evidence of infection.  She is tolerating diet and advancement of activities per usual routines.  At the time of discharge the patient is felt to be quite stable.  Pathology: DIAGNOSIS Diagnosis Thymus, thymectomy - BENIGN MESOTHELIAL LINED CYST. - BENIGN THYMIC TISSUE. - THERE IS NO EVIDENCE OF MALIGNANCY. Enid Cutter MD Pathologist, Electronic Signature (Case signed 04/12/2018) Specimen Gross and Clinical Information Specimen(s) Obtained: Thymus, thymectomy Specimen Clinical Information Anterior Mediastinal mass (nt) Gross Received fresh and consists of a 16.0 x 6.8 x 1.4 cm aggregate of yellow lobulated adipose tissue and tan red fibrous tissue. On one aspect of the specimen, there is a 5.5 x 4.0 x 0.7 cm pink red, collapsed cystic structure. The inner cyst wall is tan pink, hyperemic, and smooth. Sectioning through the remaining specimen reveals tan-red, lobulated cut surfaces. No other masses or lesions are grossly identified. Representative sections are submitted in three cassettes. A,B= Sections of cyst wall. C= grossly uninvolved tissue. (KL:gt, 04/11/18) Report signed out from the following location(s) Technical component and interpretation was performed at [facility] [place], [place], [place] [address]. CLIA #: [id number], 1 of 1    Treatments: surgery:   OPERATIVE REPORT  DATE OF PROCEDURE:  04/11/2018  PREOPERATIVE DIAGNOSIS:  Anterior mediastinal cyst.  POSTOPERATIVE DIAGNOSIS:  Anterior mediastinal cyst.  PROCEDURE:  Median sternotomy, thymectomy.  SURGEON:  Modesto Charon, MD  ASSISTANT:  Ara Kussmaul, RNFA  ANESTHESIA:  General.  FINDINGS:  Approximately 5 cm thin-walled cyst with adjacent hyperplastic thymus.   Discharge Exam: Blood pressure 114/80, pulse 82, temperature  98.4 F (36.9 C), temperature source Oral, resp. rate 13, height 5\' 2"  (1.575 m), weight 79.3 kg (174 lb 13.2 oz), last menstrual period 10/18/2011, SpO2 97 %.   General appearance: alert, cooperative and no distress Heart: regular rate and rhythm Lungs: mildly dim in bases Abdomen: benign exam Extremities: no edema or calf tenderness Wound: incis healing well   Disposition: Discharge disposition: 01-Home or Self Care     Discharge home  Discharge Instructions    Discharge patient   Complete by:  As directed    Discharge disposition:  01-Home or Self Care   Discharge patient date:  04/15/2018     Allergies as of 04/15/2018      Reactions   Zofran [ondansetron Hcl] Hives   Tramadol Nausea Only   *ANALGESICS-OPIOIDS*   Prednisone Other (See Comments)   Pt reports hyperactivity, "makes me crazy"      Medication List    STOP taking these medications   methimazole 5 MG tablet Commonly known as:  TAPAZOLE   mupirocin ointment 2 % Commonly known as:  BACTROBAN     TAKE these medications   acetaminophen 500 MG tablet Commonly known as:  TYLENOL Take 2 tablets (1,000 mg total) by mouth every 8 (eight) hours as needed for 5 days for mild pain, THEN 2 tablets (1,000 mg total) every 8 (eight) hours as needed for up to 5 days for mild pain. Start taking on:  04/15/2018   ADDERALL 30 MG tablet Generic drug:  amphetamine-dextroamphetamine Take 15 mg by mouth 2 (two) times daily  with breakfast and lunch. IN THE MORNING & AT NOON.   albuterol 108 (90 Base) MCG/ACT inhaler Commonly known as:  PROVENTIL HFA;VENTOLIN HFA Inhale 2 puffs into the lungs every 6 (six) hours as needed for wheezing or shortness of breath.   amLODipine 10 MG tablet Commonly known as:  NORVASC Take 10 mg by mouth daily.   aspirin 325 MG tablet Take 325 mg by mouth daily.   atorvastatin 10 MG tablet Commonly known as:  LIPITOR Take 1 tablet (10 mg total) by mouth daily.   buPROPion 150 MG 24 hr  tablet Commonly known as:  WELLBUTRIN XL Take 150 mg by mouth daily.   cycloSPORINE 0.05 % ophthalmic emulsion Commonly known as:  RESTASIS Place 1-2 drops into both eyes 2 (two) times daily as needed (dry eyes).   furosemide 20 MG tablet Commonly known as:  LASIX Take 20 mg by mouth daily.   gabapentin 800 MG tablet Commonly known as:  NEURONTIN Take 800 mg by mouth 3 (three) times daily.   isosorbide mononitrate 30 MG 24 hr tablet Commonly known as:  IMDUR Take 30 mg by mouth daily.   metoprolol tartrate 25 MG tablet Commonly known as:  LOPRESSOR Take 25 mg by mouth daily.   mometasone 0.1 % ointment Commonly known as:  ELOCON Apply 1 application topically daily as needed (for eczema).   nicotine 14 mg/24hr patch Commonly known as:  NICODERM CQ - dosed in mg/24 hours Place 1 patch (14 mg total) onto the skin daily.   nitroGLYCERIN 0.4 MG SL tablet Commonly known as:  NITROSTAT Place 1 tablet (0.4 mg total) under the tongue every 5 (five) minutes x 3 doses as needed for chest pain.   Oxycodone HCl 10 MG Tabs Take 1-2 tablets (10-20 mg total) by mouth every 4 (four) hours as needed for up to 5 days (pain). What changed:    medication strength  how much to take  reasons to take this   sertraline 50 MG tablet Commonly known as:  ZOLOFT Take 50 mg by mouth daily. Takes a 100mg  and a 50mg  tablet daily for total dose of 150mg  daily.   sertraline 100 MG tablet Commonly known as:  ZOLOFT Take 100 mg by mouth daily. Takes a 100mg  and a 50mg  tablet daily for total dose of 150mg  daily.   Tretinoin 0.05 % Lotn Apply 1 application topically at bedtime.      Follow-up Information    [name], MD Follow up.   Specialty:  Cardiothoracic Surgery Why:  Please see discharge paperwork for follow-up appointment with the nurse and Dr. Roxan Hockey.  Obtain a chest x-ray 1/2-hour prior to appointment with Dr. Roxan Hockey from [facility] imaging which is located on  the first floor of the same office complex. Contact information: [address] [place] [address] [phone]           Signed: John Giovanni 04/15/2018, 8:09 AM

## 2018-04-12 NOTE — Progress Notes (Signed)
Pt. Incentive spirometer 1250 goal set was able to accomplish goal will continue to progress as tolerated by patient.  Alert & oriented is encouraged to use PCA for pain as needed.  Assisted up to chair this am tolerating it well.

## 2018-04-12 NOTE — Plan of Care (Signed)
Patient has tolerated having chest tube and foley removed well.  Patient has been walking in the hallway.  Patient has tolerated the transition of pain medication from IV to PO well.

## 2018-04-13 ENCOUNTER — Inpatient Hospital Stay (HOSPITAL_COMMUNITY): Payer: Medicaid Other

## 2018-04-13 LAB — CBC
HCT: 40.2 % (ref 36.0–46.0)
Hemoglobin: 12.5 g/dL (ref 12.0–15.0)
MCH: 29.7 pg (ref 26.0–34.0)
MCHC: 31.1 g/dL (ref 30.0–36.0)
MCV: 95.5 fL (ref 78.0–100.0)
Platelets: 242 10*3/uL (ref 150–400)
RBC: 4.21 MIL/uL (ref 3.87–5.11)
RDW: 12.9 % (ref 11.5–15.5)
WBC: 13.4 10*3/uL — AB (ref 4.0–10.5)

## 2018-04-13 LAB — BASIC METABOLIC PANEL
ANION GAP: 8 (ref 5–15)
BUN: 17 mg/dL (ref 6–20)
CO2: 30 mmol/L (ref 22–32)
CREATININE: 0.87 mg/dL (ref 0.44–1.00)
Calcium: 9.3 mg/dL (ref 8.9–10.3)
Chloride: 103 mmol/L (ref 98–111)
GFR calc Af Amer: >60 mL/min (ref >60)
GLUCOSE: 107 mg/dL — AB (ref 70–99)
Potassium: 4.6 mmol/L (ref 3.5–5.1)
Sodium: 141 mmol/L (ref 135–145)

## 2018-04-13 NOTE — Progress Notes (Signed)
Patient ID: Amanda Cardenas, female   DOB: 1966/01/06, 52 y.o.   MRN: [id number] T[facility] DAILY ICU PROGRESS NOTE                   [address]            [place] [address]          [phone]   2 Days Post-Op Procedure(s) (LRB): MEDIAN STERNOTOMY (N/A) THYMECTOMY (N/A)  Total Length of Stay:  LOS: 2 days   Subjective: Patient up and mobile, respiratory status improved  Objective: Vital signs in last 24 hours: Temp:  [97.9 F (36.6 C)-98.3 F (36.8 C)] 98.2 F (36.8 C) (07/13 0700) Pulse Rate:  [75-89] 89 (07/13 0720) Cardiac Rhythm: Normal sinus rhythm (07/13 0700) Resp:  [11-21] 13 (07/13 0720) BP: (119-151)/(90-132) 119/97 (07/13 0720) SpO2:  [90 %-98 %] 90 % (07/13 0720)  Filed Weights   04/11/18 2205  Weight: 174 lb 13.2 oz (79.3 kg)    Weight change:    Hemodynamic parameters for last 24 hours:    Intake/Output from previous day: 07/12 0701 - 07/13 0700 In: 2073.9 [P.O.:1440; I.V.:633.9] Out: 1486 [Urine:1476; Chest Tube:10]  Intake/Output this shift: Total I/O In: 120 [P.O.:120] Out: -   Current Meds: Scheduled Meds: . acetaminophen  1,000 mg Oral Q6H   Or  . acetaminophen (TYLENOL) oral liquid 160 mg/5 mL  1,000 mg Oral Q6H  . amLODipine  10 mg Oral Daily  . amphetamine-dextroamphetamine  15 mg Oral BID WC  . aspirin  325 mg Oral Daily  . atorvastatin  10 mg Oral q1800  . bisacodyl  10 mg Oral Daily  . buPROPion  150 mg Oral Daily  . enoxaparin (LOVENOX) injection  40 mg Subcutaneous Daily  . furosemide  20 mg Oral Daily  . gabapentin  800 mg Oral TID  . isosorbide mononitrate  30 mg Oral Daily  . metoprolol tartrate  25 mg Oral Daily  . mupirocin ointment  1 application Nasal BID  . nicotine  14 mg Transdermal Daily  . pneumococcal 23 valent vaccine  0.5 mL Intramuscular Tomorrow-1000  . senna-docusate  1 tablet Oral QHS  . sertraline  150 mg Oral Daily   Continuous Infusions: . 0.45 % NaCl with KCl 20 mEq / L Stopped  (04/13/18 0529)  . potassium chloride     PRN Meds:.albuterol, cycloSPORINE, oxyCODONE, potassium chloride, triamcinolone ointment  General appearance: alert and cooperative Neurologic: intact Heart: regular rate and rhythm, S1, S2 normal, no murmur, click, rub or gallop Lungs: diminished breath sounds bibasilar Abdomen: soft, non-tender; bowel sounds normal; no masses,  no organomegaly Extremities: extremities normal, atraumatic, no cyanosis or edema and Homans sign is negative, no sign of DVT Wound: Dressing removed sternal incision stable bone is stable  Lab Results: CBC: Recent Labs    04/12/18 0228 04/13/18 0249  WBC 21.1* 13.4*  HGB 14.2 12.5  HCT 44.8 40.2  PLT 369 242   BMET:  Recent Labs    04/12/18 0228 04/13/18 0249  NA 141 141  K 4.4 4.6  CL 105 103  CO2 27 30  GLUCOSE 125* 107*  BUN 13 17  CREATININE 0.80 0.87  CALCIUM 9.4 9.3    CMET: Lab Results  Component Value Date   WBC 13.4 (H) 04/13/2018   HGB 12.5 04/13/2018   HCT 40.2 04/13/2018   PLT 242 04/13/2018   GLUCOSE 107 (H) 04/13/2018   CHOL 163 10/25/2017   TRIG  213 (H) 10/25/2017   HDL 46 10/25/2017   LDLCALC 74 10/25/2017   ALT 16 04/10/2018   AST 12 (L) 04/10/2018   NA 141 04/13/2018   K 4.6 04/13/2018   CL 103 04/13/2018   CREATININE 0.87 04/13/2018   BUN 17 04/13/2018   CO2 30 04/13/2018   TSH 0.802 02/11/2018   INR 0.88 04/10/2018      PT/INR:  Recent Labs    04/10/18 1035  LABPROT 11.9  INR 0.88   Radiology: Dg Chest 2 View  Result Date: 04/13/2018 CLINICAL DATA:  Status post thymectomy EXAM: CHEST - 2 VIEW COMPARISON:  April 12, 2018 FINDINGS: Moderate hiatal hernia. The heart, hila, mediastinum, lungs, and pleura are otherwise unremarkable. IMPRESSION: No acute interval change.  Moderate hiatal hernia. Electronically Signed   By: Dorise Bullion III M.D   On: 04/13/2018 07:50     Assessment/Plan: S/P Procedure(s) (LRB): MEDIAN STERNOTOMY (N/A) THYMECTOMY  (N/A) Mobilize Progressing following recent sternotomy for removal of mediastinal cyst Working on pulmonary toilet Renal function stable white count decreasing   Grace Isaac 04/13/2018 9:12 AM

## 2018-04-13 NOTE — Progress Notes (Signed)
Patient had an uneventful night some tenderness to incison site with activity pain medication given with effectiveness voiced by patient.  Patient ambulated the entire unit x3 with nurse.  Patient down to radiology for chest x-ray.  Will continue to monitor.

## 2018-04-14 MED ORDER — OXYCODONE HCL 5 MG PO TABS
10.0000 mg | ORAL_TABLET | ORAL | Status: DC | PRN
  Administered 2018-04-14: 10 mg via ORAL
  Administered 2018-04-14 (×2): 20 mg via ORAL
  Administered 2018-04-14: 10 mg via ORAL
  Administered 2018-04-15 (×2): 20 mg via ORAL
  Filled 2018-04-14: qty 4
  Filled 2018-04-14: qty 2
  Filled 2018-04-14 (×3): qty 4

## 2018-04-14 NOTE — Progress Notes (Addendum)
      [address]       [place] [address]             [phone]      3 Days Post-Op Procedure(s) (LRB): MEDIAN STERNOTOMY (N/A) THYMECTOMY (N/A) Subjective: She states that she is feeling worse than she has felt and she is having a lot of incisional pain.   Objective: Vital signs in last 24 hours: Temp:  [98.2 F (36.8 C)-98.4 F (36.9 C)] 98.3 F (36.8 C) (07/14 0400) Pulse Rate:  [70-77] 77 (07/13 1452) Cardiac Rhythm: Sinus tachycardia (07/14 0700) Resp:  [13-14] 13 (07/14 0400) BP: (116-155)/(84-106) 155/106 (07/14 0400) SpO2:  [96 %-97 %] 96 % (07/13 1452)     Intake/Output from previous day: 07/13 0701 - 07/14 0700 In: 1200 [P.O.:1200] Out: 0  Intake/Output this shift: No intake/output data recorded.  General appearance: alert, cooperative and no distress Heart: regular rate and rhythm, S1, S2 normal, no murmur, click, rub or gallop Lungs: clear to auscultation bilaterally Abdomen: soft, non-tender; bowel sounds normal; no masses,  no organomegaly Extremities: extremities normal, atraumatic, no cyanosis or edema Wound: clean and dry  Lab Results: Recent Labs    04/12/18 0228 04/13/18 0249  WBC 21.1* 13.4*  HGB 14.2 12.5  HCT 44.8 40.2  PLT 369 242   BMET:  Recent Labs    04/12/18 0228 04/13/18 0249  NA 141 141  K 4.4 4.6  CL 105 103  CO2 27 30  GLUCOSE 125* 107*  BUN 13 17  CREATININE 0.80 0.87  CALCIUM 9.4 9.3    PT/INR: No results for input(s): LABPROT, INR in the last 72 hours. ABG    Component Value Date/Time   PHART 7.440 04/10/2018 1036   HCO3 26.4 04/10/2018 1036   TCO2 30 04/24/2013 0557   O2SAT 97.8 04/10/2018 1036   CBG (last 3)  Recent Labs    04/11/18 1233  GLUCAP 135*    Assessment/Plan: S/P Procedure(s) (LRB): MEDIAN STERNOTOMY (N/A) THYMECTOMY (N/A)  1. CV-hypertensive. On home agents, may need to add additional antihypertensive. ST in the low 100s 2. Pulm-tolerating room air with good  oxygen saturation. CXR yesterday stable. Chest tube removed. 3. Renal-creatinine 0.87, electrolytes okay 4. H and H stable, platelets 242k 5. Endo-blood glucose well controlled  Plan: Doing well post-op. Continue to work on pain control. Work on ambulation in [employer]. May need an additional hypertensive agent however patient states that the pain causes her BP to increase.     LOS: 3 days    Elgie Collard 04/14/2018  Poss home tomorrow  Follow up xray in am I have seen and examined Tevis H Jaskulski and agree with the above assessment  and plan.  Grace Isaac MD [facility] [phone] Office [phone] 04/14/2018 11:02 AM

## 2018-04-15 MED ORDER — OXYCODONE HCL 10 MG PO TABS: 10.0000 mg | ORAL_TABLET | ORAL | 0 refills | Status: AC | PRN

## 2018-04-15 MED ORDER — ACETAMINOPHEN 500 MG PO TABS: ORAL_TABLET | ORAL | 0 refills | Status: AC

## 2018-04-15 NOTE — Progress Notes (Signed)
Patient discharged home with son, paperwork and discharge instructions reviewed- questions answered. Personal belongings gathered, extra supplies for cleaning incision provided. Steri strips applied after suture removal, per order. Patient wheeled to front entrance.

## 2018-04-15 NOTE — Progress Notes (Signed)
      [address]       [place] [address]             [phone]      4 Days Post-Op Procedure(s) (LRB): MEDIAN STERNOTOMY (N/A) THYMECTOMY (N/A) Subjective: Sore, but tolerable  Objective: Vital signs in last 24 hours: Temp:  [98 F (36.7 C)-98.4 F (36.9 C)] 98.4 F (36.9 C) (07/15 0745) Pulse Rate:  [82-90] 82 (07/14 2000) Cardiac Rhythm: Normal sinus rhythm (07/15 0401) Resp:  [13-22] 13 (07/15 0745) BP: (114-149)/(80-106) 114/80 (07/15 0745) SpO2:  [97 %-100 %] 97 % (07/14 2000)  Hemodynamic parameters for last 24 hours:    Intake/Output from previous day: 07/14 0701 - 07/15 0700 In: 1120 [P.O.:1120] Out: 2 [Urine:1; Stool:1] Intake/Output this shift: No intake/output data recorded.  General appearance: alert, cooperative and no distress Heart: regular rate and rhythm Lungs: mildly dim in bases Abdomen: benign exam Extremities: no edema or calf tenderness Wound: incis healing well  Lab Results: Recent Labs    04/13/18 0249  WBC 13.4*  HGB 12.5  HCT 40.2  PLT 242   BMET:  Recent Labs    04/13/18 0249  NA 141  K 4.6  CL 103  CO2 30  GLUCOSE 107*  BUN 17  CREATININE 0.87  CALCIUM 9.3    PT/INR: No results for input(s): LABPROT, INR in the last 72 hours. ABG    Component Value Date/Time   PHART 7.440 04/10/2018 1036   HCO3 26.4 04/10/2018 1036   TCO2 30 04/24/2013 0557   O2SAT 97.8 04/10/2018 1036   CBG (last 3)  No results for input(s): GLUCAP in the last 72 hours.  Meds Scheduled Meds: . acetaminophen  1,000 mg Oral Q6H   Or  . acetaminophen (TYLENOL) oral liquid 160 mg/5 mL  1,000 mg Oral Q6H  . amLODipine  10 mg Oral Daily  . amphetamine-dextroamphetamine  15 mg Oral BID WC  . aspirin  325 mg Oral Daily  . atorvastatin  10 mg Oral q1800  . bisacodyl  10 mg Oral Daily  . buPROPion  150 mg Oral Daily  . enoxaparin (LOVENOX) injection  40 mg Subcutaneous Daily  . furosemide  20 mg Oral Daily  . gabapentin   800 mg Oral TID  . isosorbide mononitrate  30 mg Oral Daily  . metoprolol tartrate  25 mg Oral Daily  . mupirocin ointment  1 application Nasal BID  . nicotine  14 mg Transdermal Daily  . pneumococcal 23 valent vaccine  0.5 mL Intramuscular Tomorrow-1000  . senna-docusate  1 tablet Oral QHS  . sertraline  150 mg Oral Daily   Continuous Infusions: . 0.45 % NaCl with KCl 20 mEq / L Stopped (04/13/18 0529)  . potassium chloride     PRN Meds:.albuterol, cycloSPORINE, oxyCODONE, potassium chloride, triamcinolone ointment  Xrays No results found.  Assessment/Plan: S/P Procedure(s) (LRB): MEDIAN STERNOTOMY (N/A) THYMECTOMY (N/A) Plan for discharge: see discharge orders hemodyn stable in sinus, SBP somewhat variable- cont current meds she was on at home Routine postop management, disscussed discharge instructions  LOS: 4 days    John Giovanni 04/15/2018

## 2018-04-26 ENCOUNTER — Other Ambulatory Visit: Payer: Self-pay | Admitting: Thoracic Surgery (Cardiothoracic Vascular Surgery)

## 2018-04-26 DIAGNOSIS — Z9089 Acquired absence of other organs: Secondary | ICD-10-CM

## 2018-04-30 ENCOUNTER — Encounter: Payer: Medicaid Other | Admitting: Thoracic Surgery (Cardiothoracic Vascular Surgery)

## 2018-04-30 ENCOUNTER — Ambulatory Visit (INDEPENDENT_AMBULATORY_CARE_PROVIDER_SITE_OTHER): Payer: Self-pay | Admitting: Thoracic Surgery (Cardiothoracic Vascular Surgery)

## 2018-04-30 ENCOUNTER — Ambulatory Visit
Admission: RE | Admit: 2018-04-30 | Discharge: 2018-04-30 | Disposition: A | Discharge status: Home / Self Care | Payer: Medicaid Other | Source: Ambulatory Visit | Attending: Thoracic Surgery (Cardiothoracic Vascular Surgery) | Admitting: Thoracic Surgery (Cardiothoracic Vascular Surgery)

## 2018-04-30 VITALS — BP 118/88 | HR 79 | Resp 20 | Ht 62.0 in | Wt 173.0 lb

## 2018-04-30 DIAGNOSIS — Z9089 Acquired absence of other organs: Secondary | ICD-10-CM

## 2018-04-30 DIAGNOSIS — Z09 Encounter for follow-up examination after completed treatment for conditions other than malignant neoplasm: Secondary | ICD-10-CM

## 2018-04-30 DIAGNOSIS — Q341 Congenital cyst of mediastinum: Secondary | ICD-10-CM

## 2018-04-30 NOTE — Progress Notes (Signed)
[address]       ,[place] [address]             [phone]     HPI: Ms. Botkin returns for a scheduled postoperative follow-up  Raela Decoursey is a 52 year old woman with a history of hypertension, hiatal hernia, reflux, Barrett's esophagus, arthritis, multinodular goiter, gout, migraines, chronic pain, and peripheral neuropathy.  She had an ST elevation MI in January 2019.  A chest x-ray showed a prominent left hilum.  CT of the chest showed a 4.5 x 3.5 x 6.4 cm left sided anterior mediastinal "mass.".  A PET/CT suggested this was a simple cyst.  We referred her to radiology for needle aspiration.  They did not get any fluid, cytology showed thymic cells.  We discussed continued observation versus surgical resection.  She favored surgical resection.  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  She did well postoperatively and went home on day 4.  She is not having much incisional pain.  She is having a lot of back pain.  She denies shortness of breath.  She has some peripheral neuropathy in her hands and is noted that she is been dropping things.  This predated the surgery.  Past Medical History:  Diagnosis Date  . Abnormal thyroid ultrasound 02/08/2017   multinodular goiter  . Acid reflux   . Arthritis   . 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 Baker cyst   . Barrett's esophagus   . Colon polyps   . Gout   . H/O colonoscopy 08/2016  . Hx of cardiac cath 10/25/2017   NO INTERVENTION  . Hypertension   . Hyperthyroidism   . Migraines   . Vitamin D deficiency     Current Outpatient Medications  Medication Sig Dispense Refill  . albuterol (PROVENTIL HFA;VENTOLIN HFA) 108 (90 Base) MCG/ACT inhaler Inhale 2 puffs into the lungs every 6 (six) hours as needed for wheezing or shortness of breath.     Marland Kitchen amLODipine (NORVASC) 10 MG tablet Take 10 mg by mouth daily.    Marland Kitchen  amphetamine-dextroamphetamine (ADDERALL) 30 MG tablet Take 15 mg by mouth 2 (two) times daily with breakfast and lunch. IN THE MORNING & AT NOON.    Marland Kitchen aspirin 325 MG tablet Take 325 mg by mouth daily.    Marland Kitchen atorvastatin (LIPITOR) 10 MG tablet Take 1 tablet (10 mg total) by mouth daily. 30 tablet 1  . buPROPion (WELLBUTRIN XL) 150 MG 24 hr tablet Take 150 mg by mouth daily.    . cycloSPORINE (RESTASIS) 0.05 % ophthalmic emulsion Place 1-2 drops into both eyes 2 (two) times daily as needed (dry eyes).     . furosemide (LASIX) 20 MG tablet Take 20 mg by mouth daily.    Marland Kitchen gabapentin (NEURONTIN) 800 MG tablet Take 800 mg by mouth 3 (three) times daily.    . isosorbide mononitrate (IMDUR) 30 MG 24 hr tablet Take 30 mg by mouth daily.  3  . metoprolol tartrate (LOPRESSOR) 25 MG tablet Take 25 mg by mouth daily.    . mometasone (ELOCON) 0.1 % ointment Apply 1 application topically daily as needed (for eczema).     . nitroGLYCERIN (NITROSTAT) 0.4 MG SL tablet Place 1 tablet (0.4 mg total) under the tongue every 5 (five) minutes x 3 doses as needed for chest pain. 25 tablet 1  . sertraline (ZOLOFT) 100 MG  tablet Take 100 mg by mouth daily. Takes a 100mg  and a 50mg  tablet daily for total dose of 150mg  daily.    . sertraline (ZOLOFT) 50 MG tablet Take 50 mg by mouth daily. Takes a 100mg  and a 50mg  tablet daily for total dose of 150mg  daily.  5  . Tretinoin 0.05 % LOTN Apply 1 application topically at bedtime.     . nicotine (NICODERM CQ - DOSED IN MG/24 HOURS) 14 mg/24hr patch Place 1 patch (14 mg total) onto the skin daily. (Patient not taking: Reported on 03/08/2018) 30 patch 1   No current facility-administered medications for this visit.     Physical Exam BP 118/88   Pulse 79   Resp 20   Ht 5\' 2"  (1.575 m)   Wt 173 lb (78.5 kg)   LMP 10/18/2011 (LMP Unknown)   SpO2 98% Comment: RA  BMI 31.72 kg/m  52 year old woman in no acute distress Alert and oriented x3 with no focal deficits Sternal incision  healing well, sternum stable Cardiac regular rate and rhythm Lungs clear with equal breath sounds bilaterally  Diagnostic Tests: CHEST - 2 VIEW  COMPARISON:  04/13/2018  FINDINGS: Median sternotomy. Negative for heart failure. Improved aeration lung bases. Small left effusion is unchanged.  Moderately large hiatal hernia with air-fluid level.  IMPRESSION: Improved aeration in the lung bases.  Small left effusion unchanged.   Electronically Signed   By: Franchot Gallo M.D.   On: 04/30/2018 15:27 I personally reviewed the chest x-ray images and concur with the findings noted above  Impression: Erine Phenix is a 52 year old woman who was incidentally noted to have an anterior mediastinal cyst on a chest x-ray was done back in January.  CT showed this to be 4.5 x 3.5 x 6.4 cm.  On PET CT it was photopenic.  IR attempted percutaneous drainage.  They were unable to access any fluid but did see thymic cells on the cytology.  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  I did a sternotomy and resected the thymus and cyst on 04/11/2018.  It turned out to be a benign simple cyst.  No tumor was seen.  She is recovering well from surgery.  She is having a lot of back pain.  That is a chronic problem for her.  Is also complaining of dropping things which is also a chronic problem that predated her surgery.  Recommend that she follow-up with her primary regarding that issue.  She may need an MRI of the cervical spine or something along those lines.  Surgical standpoint she is doing well.  I recommended that she not lift anything over 10 pounds for another month.  She may begin driving.  Appropriate precautions were discussed.  Since this was a simple cyst she does not need any follow-up for that.  He knows to call if she has any issues regarding her surgery or incision.  Plan:  I will be happy to see Mrs. Wessler back anytime in the future if I can be of any further assistance with her  care.  [name], MD [facility] [phone]

## 2019-01-13 ENCOUNTER — Telehealth: Payer: Self-pay | Admitting: Plastic Surgery

## 2019-01-13 NOTE — Telephone Encounter (Signed)
Called patient to confirm appointment scheduled for tomorrow. Patient answered the following questions: °1.Has the patient traveled outside of the state of [place] at all within the past 6 weeks? No °2.Does the patient have a fever or cough at all? No °3.Has the patient been tested for COVID? Had a positive COVID test? No °4. Has the patient been in contact with anyone who has tested positive? No ° °

## 2019-01-14 ENCOUNTER — Encounter: Payer: Self-pay | Admitting: Plastic Surgery

## 2019-01-14 ENCOUNTER — Other Ambulatory Visit: Payer: Self-pay

## 2019-01-14 ENCOUNTER — Institutional Professional Consult (permissible substitution): Payer: Medicaid Other | Admitting: Plastic Surgery

## 2019-01-14 ENCOUNTER — Ambulatory Visit (INDEPENDENT_AMBULATORY_CARE_PROVIDER_SITE_OTHER): Payer: Medicaid Other | Admitting: Plastic Surgery

## 2019-01-14 VITALS — BP 145/97 | Temp 97.6°F | Ht 62.0 in | Wt 176.4 lb

## 2019-01-14 DIAGNOSIS — M546 Pain in thoracic spine: Secondary | ICD-10-CM

## 2019-01-14 DIAGNOSIS — M549 Dorsalgia, unspecified: Secondary | ICD-10-CM | POA: Insufficient documentation

## 2019-01-14 DIAGNOSIS — M542 Cervicalgia: Secondary | ICD-10-CM

## 2019-01-14 DIAGNOSIS — N62 Hypertrophy of breast: Secondary | ICD-10-CM | POA: Diagnosis not present

## 2019-01-14 DIAGNOSIS — G8929 Other chronic pain: Secondary | ICD-10-CM | POA: Diagnosis not present

## 2019-01-14 NOTE — Progress Notes (Signed)
Patient ID: Amanda Cardenas, female    DOB: [birth year], 53 y.o.   MRN: [id number]   Chief Complaint  Patient presents with  . Advice Only    for breast reduction    Mammary Hyperplasia: The patient is a 53 y.o. female with a history of mammary hyperplasia for several years.  She has extremely large breasts causing symptoms that include the following: Back pain (upper and lower) and neck pain. She frequently pins bra cups higher on straps for better lift and relief. Notices relief when holding breast up in her hands. Shoulder straps causing grooves, pain occasionally requiring padding. Pain medication is sometimes required with motrin and tylenol.  Activities that are hindered by enlarged breasts include: Running and exercise.  Severe pain in the back and the neck.  Grooving of the shoulders from the bra.  She does not have any family history of breast cancer that she is aware of.  Her breasts are extremely large and fairly symmetric.  She has hyperpigmentation of the inframammary area on both sides.  The sternal to nipple distance on the right is 32 cm and the left is 33 cm.  The IMF distance is 10 cm.  She is 5 feet 2 inches tall and weighs 176 pounds.  Preoperative bra size = 38DDD cup.  The estimated excess breast tissue to be removed at the time of surgery = 900 grams on the left and 900 grams on the right.  Mammogram history: 2 years ago and was negative.  She will need an updated one.   Review of Systems  Constitutional: Positive for activity change. Negative for appetite change.  HENT: Negative.   Eyes: Negative.   Respiratory: Negative.  Negative for cough and shortness of breath.   Cardiovascular: Negative.  Negative for leg swelling.  Gastrointestinal: Negative.   Endocrine: Negative.   Genitourinary: Negative.   Musculoskeletal: Positive for back pain and neck pain.  Skin: Negative.   Hematological: Negative.   Psychiatric/Behavioral: Negative.     Past Medical History:   Diagnosis Date  . Abnormal thyroid ultrasound 02/08/2017   multinodular goiter  . Acid reflux   . Arthritis   . 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 Baker cyst   . Barrett's esophagus   . Colon polyps   . Gout   . H/O colonoscopy 08/2016  . Hx of cardiac cath 10/25/2017   NO INTERVENTION  . Hypertension   . Hyperthyroidism   . Migraines   . Vitamin D deficiency     Past Surgical History:  Procedure Laterality Date  . ABDOMINAL HYSTERECTOMY    . APPENDECTOMY    . carpal tunnel repair    . ENDOVENOUS ABLATION SAPHENOUS VEIN W/ LASER Left 09/27/2017   endovenous laser ablation left greater saphenous vein and stab phlebectomy > 20 incisions left leg by Tinnie Gens MD  . [place]  2000  . LEFT HEART CATH AND CORONARY ANGIOGRAPHY N/A 10/25/2017   Procedure: LEFT HEART CATH AND CORONARY ANGIOGRAPHY;  Surgeon: Adrian Prows, MD;  Location: [place] [facility];  Service: Cardiovascular;  Laterality: N/A;  . MEDIASTERNOTOMY N/A 04/11/2018   Procedure: MEDIAN STERNOTOMY;  Surgeon: [name], MD;  Location: [place];  Service: Thoracic;  Laterality: N/A;  . THYMECTOMY N/A 04/11/2018   Procedure: THYMECTOMY;  Surgeon: [name], MD;  Location: [place];  Service: Thoracic;  Laterality: N/A;  . TONSILLECTOMY  Current Outpatient Medications:  .  albuterol (PROVENTIL HFA;VENTOLIN HFA) 108 (90 Base) MCG/ACT inhaler, Inhale 2 puffs into the lungs every 6 (six) hours as needed for wheezing or shortness of breath. , Disp: , Rfl:  .  amLODipine (NORVASC) 10 MG tablet, Take 10 mg by mouth daily., Disp: , Rfl:  .  amphetamine-dextroamphetamine (ADDERALL) 30 MG tablet, Take 15 mg by mouth 2 (two) times daily with breakfast and lunch. IN THE MORNING & AT NOON., Disp: , Rfl:  .  aspirin 325 MG tablet, Take 325 mg by mouth daily., Disp: , Rfl:  .  atorvastatin (LIPITOR) 10 MG tablet, Take 1 tablet (10 mg  total) by mouth daily., Disp: 30 tablet, Rfl: 1 .  buPROPion (WELLBUTRIN XL) 150 MG 24 hr tablet, Take 150 mg by mouth daily., Disp: , Rfl:  .  furosemide (LASIX) 20 MG tablet, Take 20 mg by mouth daily., Disp: , Rfl:  .  isosorbide mononitrate (IMDUR) 30 MG 24 hr tablet, Take 30 mg by mouth daily., Disp: , Rfl: 3 .  metoprolol tartrate (LOPRESSOR) 25 MG tablet, Take 25 mg by mouth daily., Disp: , Rfl:  .  mometasone (ELOCON) 0.1 % ointment, Apply 1 application topically daily as needed (for eczema). , Disp: , Rfl:  .  sertraline (ZOLOFT) 100 MG tablet, Take 100 mg by mouth daily. Takes a 100mg  and a 50mg  tablet daily for total dose of 150mg  daily., Disp: , Rfl:  .  sertraline (ZOLOFT) 50 MG tablet, Take 50 mg by mouth daily. Takes a 100mg  and a 50mg  tablet daily for total dose of 150mg  daily., Disp: , Rfl: 5 .  Tretinoin 0.05 % LOTN, Apply 1 application topically at bedtime. , Disp: , Rfl:  .  cycloSPORINE (RESTASIS) 0.05 % ophthalmic emulsion, Place 1-2 drops into both eyes 2 (two) times daily as needed (dry eyes). , Disp: , Rfl:  .  fluticasone (FLONASE) 50 MCG/ACT nasal spray, fluticasone propionate 50 mcg/actuation nasal spray,suspension  INSTILL 1 SPRAY TWICE A DAY AS DIRECTED, Disp: , Rfl:  .  gabapentin (NEURONTIN) 800 MG tablet, Take 800 mg by mouth 3 (three) times daily., Disp: , Rfl:    Objective:   Vitals:   01/14/19 1323  BP: (!) 145/97  Temp: 97.6 F (36.4 C)  SpO2: 93%    Physical Exam Vitals signs and nursing note reviewed.  Constitutional:      Appearance: Normal appearance.  HENT:     Head: Normocephalic and atraumatic.     Nose: Nose normal.     Mouth/Throat:     Mouth: Mucous membranes are moist.  Eyes:     Extraocular Movements: Extraocular movements intact.  Cardiovascular:     Rate and Rhythm: Normal rate.  Pulmonary:     Effort: Pulmonary effort is normal.  Abdominal:     General: Abdomen is flat. There is no distension.     Tenderness: There is no  abdominal tenderness. There is no guarding.  Musculoskeletal:        General: Tenderness present. No swelling.  Skin:    General: Skin is warm.  Neurological:     General: No focal deficit present.     Mental Status: She is alert and oriented to person, place, and time.  Psychiatric:        Behavior: Behavior normal.     Assessment & Plan:  Neck pain  Symptomatic mammary hypertrophy  Chronic bilateral thoracic back pain  And bilateral breast reduction.  Most likely can  have superior medial pedicle technique.  Will need to have a mammogram prior to the surgery.  [place], DO

## 2019-01-16 ENCOUNTER — Other Ambulatory Visit: Payer: Self-pay | Admitting: Physician Assistant

## 2019-01-16 DIAGNOSIS — Z1231 Encounter for screening mammogram for malignant neoplasm of breast: Secondary | ICD-10-CM

## 2019-03-12 ENCOUNTER — Other Ambulatory Visit: Payer: Self-pay

## 2019-03-12 ENCOUNTER — Ambulatory Visit
Admission: RE | Admit: 2019-03-12 | Discharge: 2019-03-12 | Disposition: A | Discharge status: Home / Self Care | Payer: Medicaid Other | Source: Ambulatory Visit | Attending: Physician Assistant | Admitting: Physician Assistant

## 2019-03-12 DIAGNOSIS — Z1231 Encounter for screening mammogram for malignant neoplasm of breast: Secondary | ICD-10-CM

## 2019-04-23 DIAGNOSIS — M7051 Other bursitis of knee, right knee: Secondary | ICD-10-CM | POA: Insufficient documentation

## 2019-05-23 ENCOUNTER — Other Ambulatory Visit: Payer: Self-pay | Admitting: General Surgery

## 2019-05-23 DIAGNOSIS — K439 Ventral hernia without obstruction or gangrene: Secondary | ICD-10-CM

## 2019-06-02 ENCOUNTER — Encounter: Payer: Self-pay | Admitting: Cardiology

## 2019-06-13 ENCOUNTER — Ambulatory Visit
Admission: RE | Admit: 2019-06-13 | Discharge: 2019-06-13 | Disposition: A | Discharge status: Home / Self Care | Payer: Medicaid Other | Source: Ambulatory Visit | Attending: General Surgery | Admitting: General Surgery

## 2019-06-13 DIAGNOSIS — K439 Ventral hernia without obstruction or gangrene: Secondary | ICD-10-CM

## 2019-06-13 MED ORDER — IOPAMIDOL (ISOVUE-300) INJECTION 61%
100.0000 mL | Freq: Once | INTRAVENOUS | Status: AC | PRN
  Administered 2019-06-13: 100 mL via INTRAVENOUS

## 2019-06-16 ENCOUNTER — Other Ambulatory Visit: Payer: Self-pay

## 2019-06-16 ENCOUNTER — Encounter: Payer: Self-pay | Admitting: Cardiology

## 2019-06-16 ENCOUNTER — Ambulatory Visit (INDEPENDENT_AMBULATORY_CARE_PROVIDER_SITE_OTHER): Payer: Medicaid Other | Admitting: Cardiology

## 2019-06-16 VITALS — BP 146/95 | HR 77 | Ht 62.0 in | Wt 175.4 lb

## 2019-06-16 DIAGNOSIS — Z01810 Encounter for preprocedural cardiovascular examination: Secondary | ICD-10-CM | POA: Diagnosis not present

## 2019-06-16 DIAGNOSIS — F17200 Nicotine dependence, unspecified, uncomplicated: Secondary | ICD-10-CM

## 2019-06-16 DIAGNOSIS — E7800 Pure hypercholesterolemia, unspecified: Secondary | ICD-10-CM | POA: Diagnosis not present

## 2019-06-16 DIAGNOSIS — I10 Essential (primary) hypertension: Secondary | ICD-10-CM

## 2019-06-16 NOTE — Progress Notes (Deleted)
p 

## 2019-06-16 NOTE — Progress Notes (Signed)
Primary Physician/Referring:  Milford Cage, PA  Patient ID: Amanda Cardenas, female    DOB: 22-Mar-1966, 53 y.o.   MRN: [id number]  Chief Complaint  Patient presents with  . Hyperlipidemia  . Follow-up    pre op for hernia and wisdom tooth extraction   HPI:      HPI: Amanda Cardenas  is a 53 y.o. hypertension,s/p thymectomy with removal of the mediastinal cyst on 04/11/2018,  coronary artery disease status post non-STEMI 10/2017. Coronary angiogram showed normal coronary arteries. She was thought to have possible coronary spasm.   Last seen by [place] in May 2019, doing well since last seen by [place]. She has pending wisdom tooth extraction and ventral hernia repair, date TBD. Denies any chest pain since being on medical therapy. She does not do much physical activity, but denies angina with walking. She reports that she continues to have issues with her thyroid and hoping to establish with endocrinology soon.  She has quit smoking as of a few weeks ago.   Past Medical History:  Diagnosis Date  . Abnormal thyroid ultrasound 02/08/2017   multinodular goiter  . Acid reflux   . Arthritis   . 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 Baker cyst   . Barrett's esophagus   . Colon polyps   . Gout   . H/O colonoscopy 08/2016  . Hx of cardiac cath 10/25/2017   NO INTERVENTION  . Hypertension   . Hyperthyroidism   . Migraines   . Vitamin D deficiency    Past Surgical History:  Procedure Laterality Date  . ABDOMINAL HYSTERECTOMY    . APPENDECTOMY    . carpal tunnel repair    . ENDOVENOUS ABLATION SAPHENOUS VEIN W/ LASER Left 09/27/2017   endovenous laser ablation left greater saphenous vein and stab phlebectomy > 20 incisions left leg by Tinnie Gens MD  . [place]  2000  . LEFT HEART CATH AND CORONARY ANGIOGRAPHY N/A 10/25/2017   Procedure: LEFT HEART CATH AND CORONARY ANGIOGRAPHY;  Surgeon: Adrian Prows, MD;   Location: [place] [facility];  Service: Cardiovascular;  Laterality: N/A;  . MEDIASTERNOTOMY N/A 04/11/2018   Procedure: MEDIAN STERNOTOMY;  Surgeon: [name], MD;  Location: [place];  Service: Thoracic;  Laterality: N/A;  . THYMECTOMY N/A 04/11/2018   Procedure: THYMECTOMY;  Surgeon: [name], MD;  Location: [facility];  Service: Thoracic;  Laterality: N/A;  . TONSILLECTOMY     Social History   Socioeconomic History  . Marital status: Single    Spouse name: Not on file  . Number of children: 4  . Years of education: Not on file  . Highest education level: Not on file  Occupational History  . Occupation: unemployed  Social Needs  . Financial resource strain: Not on file  . Food insecurity    Worry: Not on file    Inability: Not on file  . Transportation needs    Medical: Not on file    Non-medical: Not on file  Tobacco Use  . Smoking status: Former Smoker    Packs/day: 0.25    Years: 25.00    Pack years: 6.25    Types: E-cigarettes, Cigarettes    Quit date: 05/2019    Years since quitting: 0.1  . Smokeless tobacco: Never Used  . Tobacco comment: trying to quit, 3 days/week  Substance and Sexual Activity  . Alcohol use: No  . Drug use:  No  . Sexual activity: Not Currently  Lifestyle  . Physical activity    Days per week: Not on file    Minutes per session: Not on file  . Stress: Not on file  Relationships  . Social Herbalist on phone: Not on file    Gets together: Not on file    Attends religious service: Not on file    Active member of club or organization: Not on file    Attends meetings of clubs or organizations: Not on file    Relationship status: Not on file  . Intimate partner violence    Fear of current or ex partner: Not on file    Emotionally abused: Not on file    Physically abused: Not on file    Forced sexual activity: Not on file  Other Topics Concern  . Not on file  Social History Narrative  . Not on file   ROS   Review of Systems  Constitution: Negative for decreased appetite, malaise/fatigue, weight gain and weight loss.  Eyes: Negative for visual disturbance.  Cardiovascular: Negative for chest pain, claudication, dyspnea on exertion, leg swelling, orthopnea, palpitations and syncope.  Respiratory: Negative for hemoptysis and wheezing.   Endocrine: Negative for cold intolerance and heat intolerance.  Hematologic/Lymphatic: Does not bruise/bleed easily.  Skin: Negative for nail changes.  Musculoskeletal: Negative for muscle weakness and myalgias.  Gastrointestinal: Negative for abdominal pain, change in bowel habit, nausea and vomiting.  Neurological: Negative for difficulty with concentration, dizziness, focal weakness and headaches.  Psychiatric/Behavioral: Negative for altered mental status and suicidal ideas.  All other systems reviewed and are negative.  Objective  Blood pressure (!) 146/95, pulse 77, height 5\' 2"  (1.575 m), weight 175 lb 6.4 oz (79.6 kg), last menstrual period 10/18/2011, SpO2 97 %. Body mass index is 32.08 kg/m.   Physical Exam  Constitutional: She is oriented to person, place, and time. Vital signs are normal. She appears well-developed and well-nourished.  HENT:  Head: Normocephalic and atraumatic.  Neck: Normal range of motion.  Cardiovascular: Normal rate, regular rhythm, normal heart sounds and intact distal pulses.  Pulmonary/Chest: Effort normal and breath sounds normal. No accessory muscle usage. No respiratory distress.  Abdominal: Soft. Bowel sounds are normal.  Musculoskeletal: Normal range of motion.  Neurological: She is alert and oriented to person, place, and time.  Skin: Skin is warm and dry.  Vitals reviewed.  Radiology: No results found.  Laboratory examination:   No results for input(s): NA, K, CL, CO2, GLUCOSE, BUN, CREATININE, CALCIUM, GFRNONAA, GFRAA in the last 8760 hours. CMP Latest Ref Rng & Units 04/13/2018 04/12/2018 04/10/2018  Glucose  70 - 99 mg/dL 107(H) 125(H) 82  BUN 6 - 20 mg/dL 17 13 17   Creatinine 0.44 - 1.00 mg/dL 0.87 0.80 0.94  Sodium 135 - 145 mmol/L 141 141 140  Potassium 3.5 - 5.1 mmol/L 4.6 4.4 3.9  Chloride 98 - 111 mmol/L 103 105 106  CO2 22 - 32 mmol/L 30 27 24   Calcium 8.9 - 10.3 mg/dL 9.3 9.4 8.9  Total Protein 6.5 - 8.1 g/dL - - 6.5  Total Bilirubin 0.3 - 1.2 mg/dL - - 0.5  Alkaline Phos 38 - 126 U/L - - 66  AST 15 - 41 U/L - - 12(L)  ALT 0 - 44 U/L - - 16   CBC Latest Ref Rng & Units 04/13/2018 04/12/2018 04/10/2018  WBC 4.0 - 10.5 K/uL 13.4(H) 21.1(H) 9.5  Hemoglobin 12.0 - 15.0  g/dL 12.5 14.2 13.5  Hematocrit 36.0 - 46.0 % 40.2 44.8 42.4  Platelets 150 - 400 K/uL 242 369 276   Lipid Panel     Component Value Date/Time   CHOL 163 10/25/2017 0617   TRIG 213 (H) 10/25/2017 0617   HDL 46 10/25/2017 0617   CHOLHDL 3.5 10/25/2017 0617   VLDL 43 (H) 10/25/2017 0617   LDLCALC 74 10/25/2017 0617   HEMOGLOBIN A1C No results found for: HGBA1C, MPG TSH No results for input(s): TSH in the last 8760 hours. Medications   Current Outpatient Medications  Medication Instructions  . albuterol (PROVENTIL HFA;VENTOLIN HFA) 108 (90 Base) MCG/ACT inhaler 2 puffs, Inhalation, Every 6 hours PRN  . amLODipine (NORVASC) 10 mg, Oral, Daily  . amphetamine-dextroamphetamine (ADDERALL) 30 MG tablet 15 mg, Oral, 2 times daily with breakfast and lunch, IN THE MORNING & AT NOON.  Marland Kitchen aspirin 81 mg, Oral, Daily  . atorvastatin (LIPITOR) 10 mg, Oral, Daily  . buPROPion (WELLBUTRIN XL) 150 mg, Oral, Daily  . cycloSPORINE (RESTASIS) 0.05 % ophthalmic emulsion 1-2 drops, Both Eyes, 2 times daily PRN  . fluticasone (FLONASE) 50 MCG/ACT nasal spray fluticasone propionate 50 mcg/actuation nasal spray,suspension  INSTILL 1 SPRAY TWICE A DAY AS DIRECTED  . furosemide (LASIX) 20 mg, Oral, Daily  . gabapentin (NEURONTIN) 400 mg, Oral, 3 times daily  . isosorbide mononitrate (IMDUR) 30 mg, Oral, Daily  . mometasone (ELOCON)  0.1 % ointment 1 application, Topical, Daily PRN  . nicotine (NICODERM CQ - DOSED IN MG/24 HOURS) 14 mg, Transdermal, Daily  . propranolol (INDERAL) 10 mg, Oral, Daily  . sertraline (ZOLOFT) 100 mg, Oral, Daily, Takes a 100mg  and a 50mg  tablet daily for total dose of 150mg  daily.  . sertraline (ZOLOFT) 50 mg, Oral, Daily, Takes a 100mg  and a 50mg  tablet daily for total dose of 150mg  daily.  . Tretinoin [id number] % LOTN 1 application, Apply externally, Daily at bedtime    Cardiac Studies:   Carotid duplex at [facility] 05/22/2019: No hemodynamically significant stenosis in either th right or left carotid arterial systems.  Coronary angiogram 10/25/2017: Normal LV systolic function without wall motion abnormality. Left dominant circulation, tortuous coronary arteries but no significant disease suggestive of hypertensive heart disease. Small nondominant RCA with anterior origin. Ascending aortogram reveals presence of 3 aortic valve cusps without aortic regurgitation, no dissection was evident, no aneurysm.  Echocardiogram 10/26/2017: - Left ventricle: The cavity size was normal. Systolic function was normal. Wall motion was normal; there were no regional wall motion abnormalities. - Left atrium: The atrium was mildly dilated. No significant valvular abnormalities.  Assessment     ICD-10-CM   1. Preoperative cardiovascular examination  Z01.810   2. Essential hypertension  I10   3. Hypercholesterolemia  E78.00 EKG 12-Lead  4. Tobacco use disorder  F17.200     EKG 06/16/2019: Normal sinus rhythm at 72 bpm, normal axis, no evidence of ischemia.   Recommendations:   Patient is presently doing well without any complaints today.  She is scheduled for upcoming wisdom tooth extraction and ventral hernia repair.  She has had normal coronary angiogram in January 2019 and is currently without any symptoms.  Do not see any contraindication for her to proceed with surgery.  She can proceed with low risk  for perioperative CV complications.  She is on appropriate medical therapy.  Her blood pressure is slightly elevated today, but generally well controlled.  She will continue to follow-up with her PCP regarding this.  Discussed importance of continued primary and secondary prevention measures. Congratulated her on recently quitting smoking, and urged her to continue with this. We will see her back on a as needed basis for any new or worsening problems.   *I have discussed this case with Dr. Einar Gip and he personally examined the patient and participated in formulating the plan.*   Miquel Dunn, MSN, APRN, FNP-C [facility] Cardiovascular. [place] Office: [phone] Fax: [phone]

## 2019-06-30 ENCOUNTER — Ambulatory Visit: Payer: Self-pay | Admitting: Cardiology

## 2019-07-08 ENCOUNTER — Other Ambulatory Visit: Payer: Self-pay

## 2019-07-10 ENCOUNTER — Ambulatory Visit (INDEPENDENT_AMBULATORY_CARE_PROVIDER_SITE_OTHER): Payer: Medicaid Other | Admitting: Endocrinology

## 2019-07-10 ENCOUNTER — Other Ambulatory Visit: Payer: Self-pay

## 2019-07-10 ENCOUNTER — Encounter: Payer: Self-pay | Admitting: Endocrinology

## 2019-07-10 DIAGNOSIS — E0590 Thyrotoxicosis, unspecified without thyrotoxic crisis or storm: Secondary | ICD-10-CM | POA: Diagnosis not present

## 2019-07-10 NOTE — Progress Notes (Signed)
Subjective:    Patient ID: Amanda Cardenas, female    DOB: [birth year], 53 y.o.   MRN: [id number]  HPI Pt is referred by Docia Barrier, PA, for hyperthyroidism.  Pt reports he was dx'ed with hyperthyroidism in 2010.  she took an uncertain type of thyroid medication in approx 2019, but she stopped due to weight gain.  she has never had XRT to the anterior neck, or thyroid surgery.  she does not consume kelp or any other non-prescribed thyroid medication.  she has never been on amiodarone.  She reports moderate anxiety, and assoc difficulty with concentration.  Past Medical History:  Diagnosis Date  . Abnormal thyroid ultrasound 02/08/2017   multinodular goiter  . Acid reflux   . Arthritis   . 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 Baker cyst   . Barrett's esophagus   . Colon polyps   . Gout   . H/O colonoscopy 08/2016  . Hx of cardiac cath 10/25/2017   NO INTERVENTION  . Hypertension   . Hyperthyroidism   . Migraines   . Vitamin D deficiency     Past Surgical History:  Procedure Laterality Date  . ABDOMINAL HYSTERECTOMY    . APPENDECTOMY    . carpal tunnel repair    . ENDOVENOUS ABLATION SAPHENOUS VEIN W/ LASER Left 09/27/2017   endovenous laser ablation left greater saphenous vein and stab phlebectomy > 20 incisions left leg by Tinnie Gens MD  . [place]  2000  . LEFT HEART CATH AND CORONARY ANGIOGRAPHY N/A 10/25/2017   Procedure: LEFT HEART CATH AND CORONARY ANGIOGRAPHY;  Surgeon: Adrian Prows, MD;  Location: [facility];  Service: Cardiovascular;  Laterality: N/A;  . MEDIASTERNOTOMY N/A 04/11/2018   Procedure: MEDIAN STERNOTOMY;  Surgeon: [name], MD;  Location: [facility];  Service: Thoracic;  Laterality: N/A;  . THYMECTOMY N/A 04/11/2018   Procedure: THYMECTOMY;  Surgeon: [name], MD;  Location: [facility];  Service: Thoracic;  Laterality: N/A;  . TONSILLECTOMY      Social  History   Socioeconomic History  . Marital status: Single    Spouse name: Not on file  . Number of children: 4  . Years of education: Not on file  . Highest education level: Not on file  Occupational History  . Occupation: unemployed  Social Needs  . Financial resource strain: Not on file  . Food insecurity    Worry: Not on file    Inability: Not on file  . Transportation needs    Medical: Not on file    Non-medical: Not on file  Tobacco Use  . Smoking status: Former Smoker    Packs/day: 0.25    Years: 25.00    Pack years: 6.25    Types: E-cigarettes, Cigarettes    Quit date: 05/2019    Years since quitting: 0.1  . Smokeless tobacco: Never Used  . Tobacco comment: trying to quit, 3 days/week  Substance and Sexual Activity  . Alcohol use: No  . Drug use: No  . Sexual activity: Not Currently  Lifestyle  . Physical activity    Days per week: Not on file    Minutes per session: Not on file  . Stress: Not on file  Relationships  . Social Herbalist on phone: Not on file    Gets together: Not on file    Attends religious service: Not on file  Active member of club or organization: Not on file    Attends meetings of clubs or organizations: Not on file    Relationship status: Not on file  . Intimate partner violence    Fear of current or ex partner: Not on file    Emotionally abused: Not on file    Physically abused: Not on file    Forced sexual activity: Not on file  Other Topics Concern  . Not on file  Social History Narrative  . Not on file    Current Outpatient Medications on File Prior to Visit  Medication Sig Dispense Refill  . albuterol (PROVENTIL HFA;VENTOLIN HFA) 108 (90 Base) MCG/ACT inhaler Inhale 2 puffs into the lungs every 6 (six) hours as needed for wheezing or shortness of breath.     Marland Kitchen amLODipine (NORVASC) 10 MG tablet Take 10 mg by mouth daily.    Marland Kitchen amphetamine-dextroamphetamine (ADDERALL) 30 MG tablet Take 15 mg by mouth 2 (two) times  daily with breakfast and lunch. IN THE MORNING & AT NOON.    Marland Kitchen aspirin 81 MG chewable tablet Chew 81 mg by mouth daily.    Marland Kitchen buPROPion (WELLBUTRIN XL) 150 MG 24 hr tablet Take 150 mg by mouth 2 (two) times daily.     . cycloSPORINE (RESTASIS) 0.05 % ophthalmic emulsion Place 1-2 drops into both eyes 2 (two) times daily as needed (dry eyes).     . fluticasone (FLONASE) 50 MCG/ACT nasal spray fluticasone propionate 50 mcg/actuation nasal spray,suspension  INSTILL 1 SPRAY TWICE A DAY AS DIRECTED    . furosemide (LASIX) 20 MG tablet Take 20 mg by mouth daily.    Marland Kitchen gabapentin (NEURONTIN) 800 MG tablet Take 400 mg by mouth 3 (three) times daily.     . isosorbide mononitrate (IMDUR) 30 MG 24 hr tablet Take 30 mg by mouth daily.  3  . mometasone (ELOCON) 0.1 % ointment Apply 1 application topically daily as needed (for eczema).     . nicotine (NICODERM CQ - DOSED IN MG/24 HOURS) 14 mg/24hr patch Place 14 mg onto the skin daily.    . propranolol (INDERAL) 10 MG tablet Take 20 mg by mouth daily.     . sertraline (ZOLOFT) 50 MG tablet Take 150 mg by mouth daily.    . Tretinoin 0.05 % LOTN Apply 1 application topically at bedtime.     Marland Kitchen atorvastatin (LIPITOR) 10 MG tablet Take 1 tablet (10 mg total) by mouth daily. 30 tablet 1   No current facility-administered medications on file prior to visit.     Allergies  Allergen Reactions  . Zofran [Ondansetron Hcl] Hives  . Tramadol Nausea Only    *ANALGESICS-OPIOIDS*  . Prednisone Other (See Comments)    Pt reports hyperactivity, "makes me crazy"    Family History  Problem Relation Age of Onset  . Hypertension Mother   . Hyperlipidemia Mother   . Diabetes Mellitus II Mother   . Heart disease Mother   . Depression Mother   . Thyroid disease Cousin     BP (!) 148/92 (BP Location: Left Arm, Patient Position: Sitting, Cuff Size: Large)   Pulse 69   Ht 5\' 2"  (1.575 m)   Wt 176 lb (79.8 kg)   LMP 10/18/2011 (LMP Unknown)   SpO2 93%   BMI 32.19  kg/m   Review of Systems denies fever, hoarseness, visual loss, sob, nocturia, muscle weakness, edema, tremor, easy bruising, and rhinorrhea. She has intermitt headache, diaphoresis, tremor, mood swings, heat intolerance,  and palpitations.  She has intermitt C and D.       Objective:   Physical Exam VS: see vs page GEN: no distress HEAD: head: no deformity eyes: no periorbital swelling, no proptosis external nose and ears are normal NECK: bilat thyroid nodules are palpable (both 1-2 cm) CHEST WALL: no deformity LUNGS: clear to auscultation CV: reg rate and rhythm, no murmur ABD: abdomen is soft, nontender.  no hepatosplenomegaly.  not distended.  no hernia MUSCULOSKELETAL: muscle bulk and strength are grossly normal.  no obvious joint swelling.  gait is normal and steady EXTEMITIES: no deformity.  no edema PULSES: no carotid bruit NEURO:  cn 2-12 grossly intact.   readily moves all 4's.  sensation is intact to touch on all 4's.  No tremor.  SKIN:  Normal texture and temperature.  No rash or suspicious lesion is visible.  Not diaphoretic. NODES:  None palpable at the neck PSYCH: alert, well-oriented.  Does not appear anxious nor depressed. [place] (2014): Multiple bilateral thyroid nodules with a single dominant nodules in each lobe. Although likely representing multinodular goiter, fine needle aspiration is recommended.  Thyroid bilat bxs (2014): BENIGN FOLLICULAR NODULE.  I have reviewed outside records, and summarized: Pt was noted to have hyperthyrodism, and referred here.  She was seen at [facility].  She had multiple sxs of hyperthyroidism, and TSH was low.    outside test results are reviewed: TSH=0.27    Assessment & Plan:  HTN: is noted today Hyperthyroidism, new to me: we discussed rx options.  She chooses radioactive iodine treatment  Patient Instructions  Your blood pressure is high today.  Please see your primary care provider soon, to have it rechecked.  let's check  a thyroid "scan" (a special, but easy and painless type of thyroid x ray).  It works like this: you go to the x-ray department of the hospital to swallow a pill, which contains a miniscule amount of radiation.  You will not notice any symptoms from this.  You will go back to the x-ray department the next day, to lie down in front of a camera.  The results of this will be sent to me.   Based on the results, i hope to order for you a treatment pill of radioactive iodine.  Although it is a larger amount of radiation, you will again notice no symptoms from this.  The pill is gone from your body in a few days (during which you should stay away from other people), but takes several months to work.  Therefore, please return here approximately 6-8 weeks after the treatment.  This treatment has been available for many years, and the only known side-effect is an underactive thyroid.  It is possible that i would eventually prescribe for you a thyroid hormone pill, which is very inexpensive.  You don't have to worry about side-effects of this thyroid hormone pill, because it is the same molecule your thyroid makes.

## 2019-07-10 NOTE — Patient Instructions (Addendum)
Your blood pressure is high today.  Please see your primary care provider soon, to have it rechecked.  let's check a thyroid "scan" (a special, but easy and painless type of thyroid x ray).  It works like this: you go to the x-ray department of the hospital to swallow a pill, which contains a miniscule amount of radiation.  You will not notice any symptoms from this.  You will go back to the x-ray department the next day, to lie down in front of a camera.  The results of this will be sent to me.   Based on the results, i hope to order for you a treatment pill of radioactive iodine.  Although it is a larger amount of radiation, you will again notice no symptoms from this.  The pill is gone from your body in a few days (during which you should stay away from other people), but takes several months to work.  Therefore, please return here approximately 6-8 weeks after the treatment.  This treatment has been available for many years, and the only known side-effect is an underactive thyroid.  It is possible that i would eventually prescribe for you a thyroid hormone pill, which is very inexpensive.  You don't have to worry about side-effects of this thyroid hormone pill, because it is the same molecule your thyroid makes.

## 2019-07-30 ENCOUNTER — Telehealth: Payer: Self-pay | Admitting: Endocrinology

## 2019-07-30 NOTE — Telephone Encounter (Signed)
Patient called re; status of appointment for Thyroid Uptake

## 2019-08-06 NOTE — Telephone Encounter (Signed)
LMTCB to give [place] insurance info we do not have card on file for this patient

## 2019-08-11 NOTE — Telephone Encounter (Signed)
Patient called back, verified Medicaid number, and re-verified in Epic. Medicaid now shows active and verified

## 2019-10-31 DIAGNOSIS — M7052 Other bursitis of knee, left knee: Secondary | ICD-10-CM | POA: Insufficient documentation

## 2019-11-06 DIAGNOSIS — M25469 Effusion, unspecified knee: Secondary | ICD-10-CM | POA: Insufficient documentation

## 2020-05-07 DIAGNOSIS — M79672 Pain in left foot: Secondary | ICD-10-CM

## 2020-05-07 HISTORY — DX: Pain in left foot: M79.672

## 2020-06-10 IMAGING — CT CT BIOPSY
4 of 9 series · 9 of 36 positions shown, 10 images · non-contrast
Comparison: PET CT - 03/23/2018;

INDICATION: No known primary, now with anterior mediastinal lesion. Please
perform CT-guided biopsy for tissue diagnostic purposes.

EXAM:
CT-GUIDED ANTERIOR MEDIASTINAL MASS BIOPSY

[Series 2: i-spiral 5.0 b40f · axial · 0.85mm/px · z∈[+1305,+1347]mm · 3 of 31 slices shown, 4 images (1 of 4)]
[im 9/31  mediastinal]
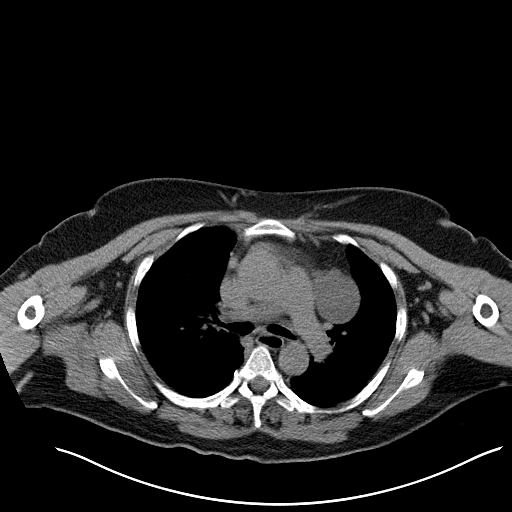
[im 9/31  lung]
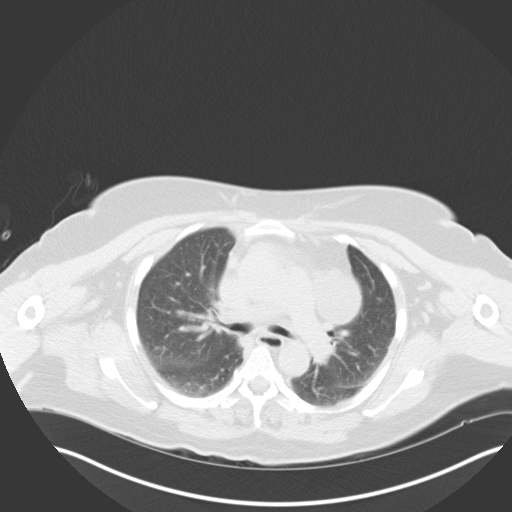
[im 11/31  lung]
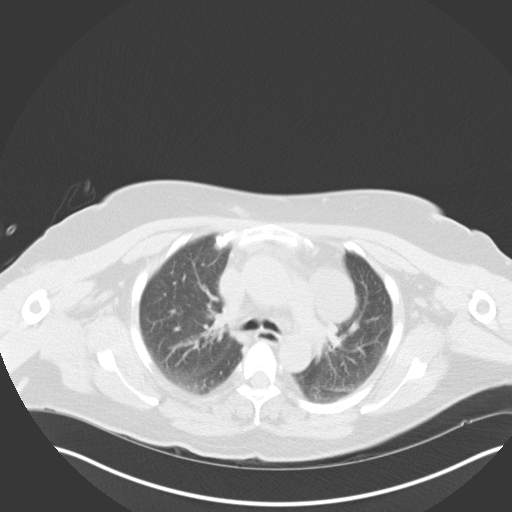
[im 21/31  lung]
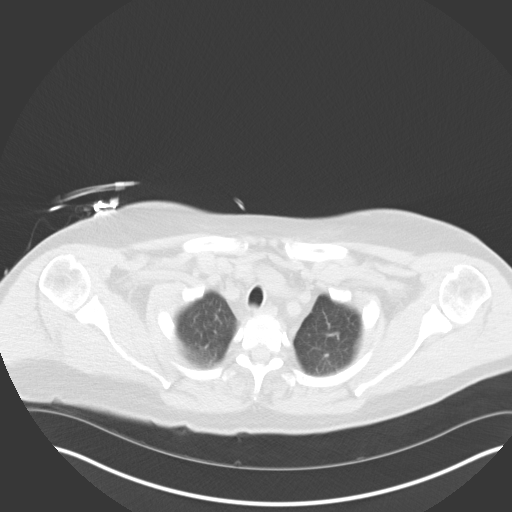

[Series 3: i-spiral 5.0 b40f · axial · 0.85mm/px · z∈[+1305,+1329]mm · 2 of 31 slices shown (2 of 4)]
[im 9/31  lung]
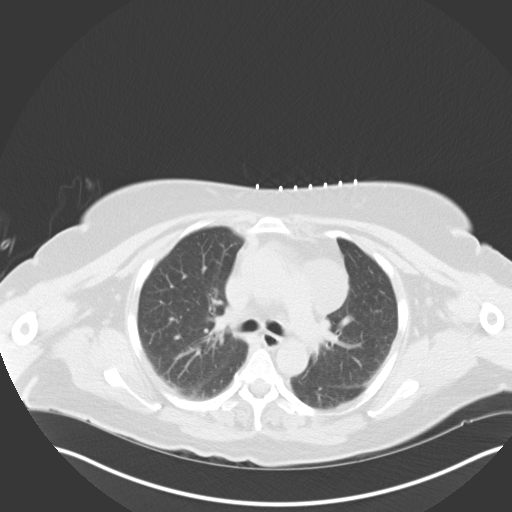
[im 16/31  lung]
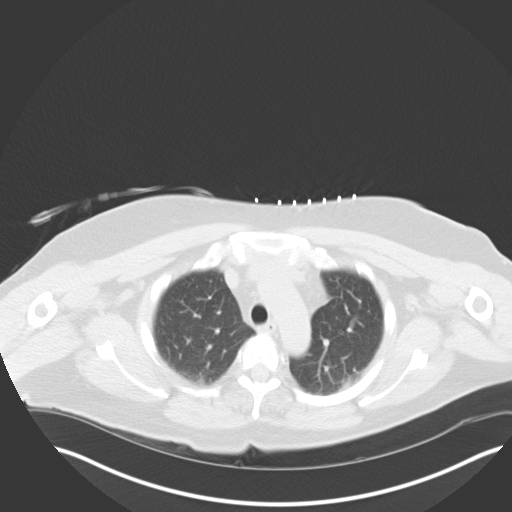

[Series 5: i-spiral 5.0 b40f · axial · 0.85mm/px · z∈[+1292,+1306]mm · 2 of 27 slices shown (3 of 4)]
[im 14/27  lung]
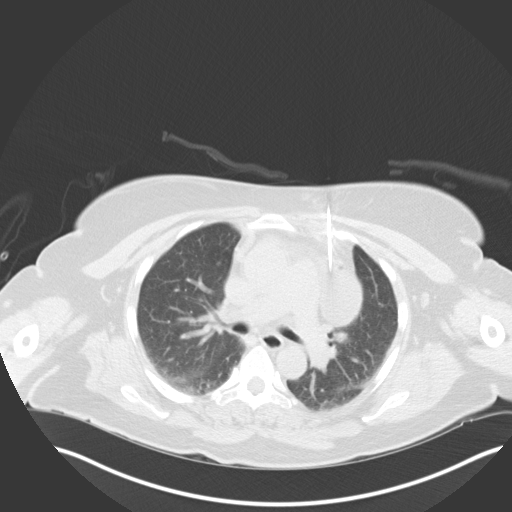
[im 18/27  lung]
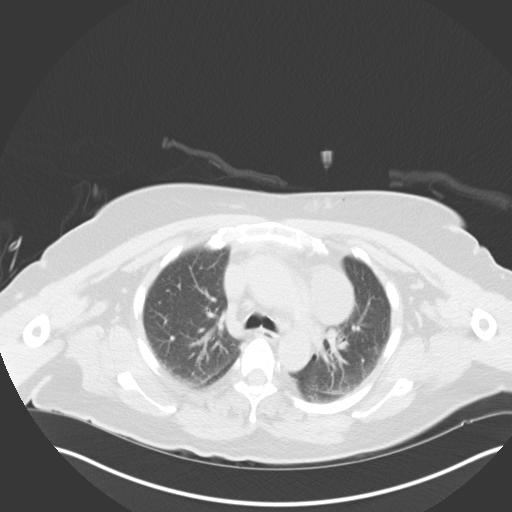

[Series 6: i-spiral 5.0 b40f · axial · 0.85mm/px · z∈[+1292,+1306]mm · 2 of 27 slices shown (4 of 4)]
[im 14/27  lung]
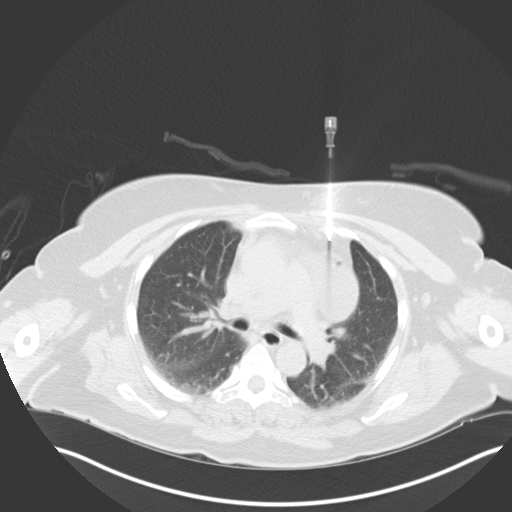
[im 18/27  lung]
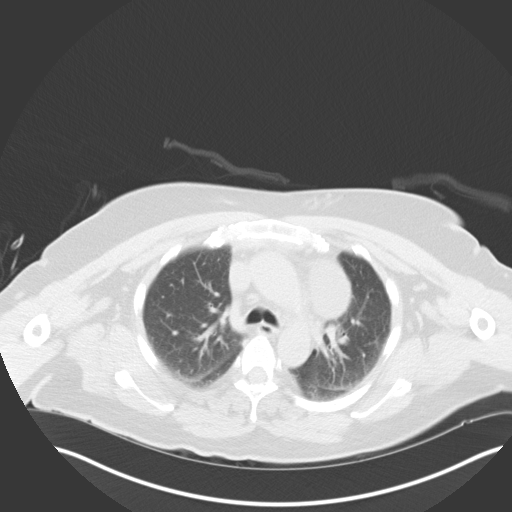

[9 of 36 positions shown; findings below may reference images not displayed]

chest CT - 12/24/2016

MEDICATIONS:
None.

ANESTHESIA/SEDATION:
Fentanyl 100 mcg IV; Versed 2 mg IV

Sedation time: 25 minutes; The patient was continuously monitored
during the procedure by the interventional radiology nurse under my
direct supervision.

CONTRAST:  None

COMPLICATIONS:
None immediate.

PROCEDURE:
Informed consent was obtained from the patient following an
explanation of the procedure, risks, benefits and alternatives. The
patient understands,agrees and consents for the procedure. All
questions were addressed. A time out was performed prior to the
initiation of the procedure.

The patient was positioned supine on the CT table and a limited
chest CT was performed for procedural planning demonstrating
unchanged size and appearance of the approximately 4.7 x 3.8 cm mass
within the left side of the anterior mediastinum (image 23, series
2). The operative site was prepped and draped in the usual sterile
fashion. Under sterile conditions and local anesthesia, a 17 gauge
coaxial needle was advanced into the peripheral aspect of the mass.
Appropriate position was confirmed with limited CT imaging as the
initial attempted placement of the trocar needle appear to have
migrated caudally likely due to pulsation of the adjacent main
pulmonary artery.

Ultimately, the 17 gauge coaxial needle was advanced into the
anterior mid aspect of the anterior mediastinal mass (image 13,
series 7). Appropriate positioning was confirmed.

Initial attempt to aspirate fluid from the trocar needle proved
unsuccessful.

As such, 3 core needle biopsies with an 18 gauge core needle biopsy
device was acquired.. The coaxial needle was removed and superficial
hemostasis was achieved with manual compression.

Limited post procedural chest CT was negative for pneumothorax or
additional complication. Note is made of a small amount of air
within nondependent portion of this presumed complex cystic lesion
however there is no evidence of pneumothorax or significant anterior
mediastinal hemorrhage. A dressing was placed. The patient tolerated
the procedure well without immediate postprocedural complication.
The patient was escorted to have an upright chest radiograph.
IMPRESSION: Technically successful CT guided core needle core biopsy of
indeterminate anterior mediastinal mass.

## 2020-07-12 ENCOUNTER — Other Ambulatory Visit: Payer: Self-pay | Admitting: Physician Assistant

## 2020-07-12 DIAGNOSIS — Z1231 Encounter for screening mammogram for malignant neoplasm of breast: Secondary | ICD-10-CM

## 2020-07-29 ENCOUNTER — Ambulatory Visit: Payer: Medicaid Other

## 2020-07-30 ENCOUNTER — Other Ambulatory Visit: Payer: Self-pay

## 2020-07-30 ENCOUNTER — Ambulatory Visit
Admission: RE | Admit: 2020-07-30 | Discharge: 2020-07-30 | Disposition: A | Discharge status: Home / Self Care | Payer: Medicare Other | Source: Ambulatory Visit | Attending: Physician Assistant | Admitting: Physician Assistant

## 2020-07-30 DIAGNOSIS — Z1231 Encounter for screening mammogram for malignant neoplasm of breast: Secondary | ICD-10-CM

## 2022-02-01 NOTE — Progress Notes (Signed)
? ?Primary Physician/Referring:  Milford Cage, PA ? ?Patient ID: Amanda Cardenas, female    DOB: July 03, 1966, 56 y.o.   MRN: [id number] ? ?Chief Complaint  ?Patient presents with  ? Hypertension  ? Follow-up  ? ?HPI:   ? ? ? ?HPI: Amanda Cardenas  is a 56 y.o. 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  Patient follows with pain management for chronic back, neck, and leg pain.  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 ? ?She was last seen in our office in September 2020, was unfortunately lost to follow-up.  She now presents to reestablish care with concerns of elevated blood pressure as well as worsening fatigue and dyspnea on exertion over the last several months.  ? ?Notably patient has upcoming physical with PCP in August.  ? ?Past Medical History:  ?Diagnosis Date  ? Abnormal thyroid ultrasound 02/08/2017  ? multinodular goiter  ? Acid reflux   ? Arthritis   ? 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 Baker cyst   ? Barrett's esophagus   ? Colon polyps   ? Gout   ? H/O colonoscopy 08/2016  ? Hx of cardiac cath 10/25/2017  ? NO INTERVENTION  ? Hypertension   ? Hyperthyroidism   ? Migraines   ? Vitamin D deficiency   ? ?Past Surgical History:  ?Procedure Laterality Date  ? ABDOMINAL HYSTERECTOMY    ? APPENDECTOMY    ? carpal tunnel repair    ? ENDOVENOUS ABLATION SAPHENOUS VEIN W/ LASER Left 09/27/2017  ? endovenous laser ablation left greater saphenous vein and stab phlebectomy > 20 incisions left leg by Tinnie Gens MD  ? [place]  2000  ? LEFT HEART CATH AND CORONARY ANGIOGRAPHY N/A 10/25/2017  ? Procedure: LEFT HEART CATH AND CORONARY ANGIOGRAPHY;  Surgeon: Adrian Prows, MD;  Location: [facility];  Service: Cardiovascular;  Laterality: N/A;  ? MEDIASTERNOTOMY N/A 04/11/2018  ? Procedure: MEDIAN STERNOTOMY;  Surgeon: [name], MD;  Location: [facility];  Service: Thoracic;  Laterality: N/A;  ? THYMECTOMY N/A 04/11/2018  ? Procedure: THYMECTOMY;  Surgeon: [name], MD;  Location: [facility];  Service: Thoracic;  Laterality: N/A;  ? TONSILLECTOMY    ? ?Social History  ? ?Tobacco Use  ? Smoking status: Former  ?  Packs/day: 0.25  ?  Years: 25.00  ?  Pack years: 6.25  ?  Types: E-cigarettes, Cigarettes  ?  Quit date: 05/2019  ?  Years since quitting: 2.7  ? Smokeless tobacco: Never  ? Tobacco comments:  ?  trying to quit, 3 days/week patient uses vape about 3 times a day when craving nicotine   ?Substance Use Topics  ? Alcohol use: No  ? ?Marital status: Single ? ?ROS  ?Review of Systems  ?Constitutional: Positive for malaise/fatigue and weight gain.  ?Cardiovascular:  Positive for dyspnea on exertion. Negative for chest pain, claudication, leg swelling, near-syncope, orthopnea, palpitations, paroxysmal nocturnal dyspnea and syncope.  ?Neurological:  Negative for dizziness.  ? ?Objective  ?Blood pressure (!) 156/93, pulse 70, temperature 98 ?F (36.7 ?C), temperature source Temporal,  resp. rate 17, height '5\' 2"'$  (1.575 m), weight 191 lb 6.4 oz (86.8 kg), last menstrual period 10/18/2011, SpO2 100 %. Body mass index is 35.01 kg/m?. ?  ?Physical Exam ?Vitals reviewed.  ?Constitutional:   ?   Appearance: She is well-developed.  ?Cardiovascular:  ?   Rate and Rhythm: Normal rate and regular rhythm.  ?   Pulses: Intact distal pulses.     ?     Carotid pulses are  on the left side with bruit. ?   Heart sounds: Normal heart sounds.  ?Pulmonary:  ?   Effort: Pulmonary effort is normal. No accessory  muscle usage or respiratory distress.  ?   Breath sounds: Normal breath sounds.  ?Musculoskeletal:  ?   Right lower leg: No edema.  ?   Left lower leg: No edema.  ?Neurological:  ?   Mental Status: She is alert.  ? ?Laboratory examination:  ? ?No results for input(s): NA, K, CL, CO2, GLUCOSE, BUN, CREATININE, CALCIUM, GFRNONAA, GFRAA in the last 8760 hours. ? ?  Latest Ref Rng & Units 04/13/2018  ?  2:49 AM 04/12/2018  ?  2:28 AM 04/10/2018  ? 10:35 AM  ?CMP  ?Glucose 70 - 99 mg/dL 107   125   82    ?BUN 6 - 20 mg/dL '17   13   17    '$ ?Creatinine 0.44 - 1.00 mg/dL 0.87   0.80   0.94    ?Sodium 135 - 145 mmol/L 141   141   140    ?Potassium 3.5 - 5.1 mmol/L 4.6   4.4   3.9    ?Chloride 98 - 111 mmol/L 103   105   106    ?CO2 22 - 32 mmol/L '30   27   24    '$ ?Calcium 8.9 - 10.3 mg/dL 9.3   9.4   8.9    ?Total Protein 6.5 - 8.1 g/dL   6.5    ?Total Bilirubin 0.3 - 1.2 mg/dL   0.5    ?Alkaline Phos 38 - 126 U/L   66    ?AST 15 - 41 U/L   12    ?ALT 0 - 44 U/L   16    ? ? ?  Latest Ref Rng & Units 04/13/2018  ?  2:49 AM 04/12/2018  ?  2:28 AM 04/10/2018  ? 10:35 AM  ?CBC  ?WBC 4.0 - 10.5 K/uL 13.4   21.1   9.5    ?Hemoglobin 12.0 - 15.0 g/dL 12.5   14.2   13.5    ?Hematocrit 36.0 - 46.0 % 40.2   44.8   42.4    ?Platelets 150 - 400 K/uL 242   369   276    ? ?Lipid Panel  ?   ?Component Value Date/Time  ? CHOL 163 10/25/2017 0617  ? TRIG 213 (H) 10/25/2017 0617  ? HDL 46 10/25/2017 0617  ? CHOLHDL 3.5 10/25/2017 0617  ? VLDL 43 (H) 10/25/2017 0617  ? [place] 74 10/25/2017 0617  ? ?HEMOGLOBIN A1C ?No results found for: HGBA1C, MPG ?TSH ?No results for input(s): TSH in the last 8760 hours. ? ?External Labs:  ?11/17/2021: ?BUN 14, creatinine 1.12, GFR 57, sodium 141, potassium 4.7 ? ?Allergies  ? ?Allergies  ?Allergen Reactions  ? Zofran [Ondansetron Hcl] Hives  ? Tramadol Nausea Only  ?  *ANALGESICS-OPIOIDS*  ? Prednisone Other (See Comments)  ?  Pt reports hyperactivity, "makes me crazy"  ?  ?Medications Prior  to Visit:  ? ?Outpatient  Medications Prior to Visit  ?Medication Sig Dispense Refill  ? albuterol (PROVENTIL HFA;VENTOLIN HFA) 108 (90 Base) MCG/ACT inhaler Inhale 2 puffs into the lungs every 6 (six) hours as needed for wheezing or shortness of breath.     ? amLODipine (NORVASC) 10 MG tablet Take 10 mg by mouth daily.    ? amphetamine-dextroamphetamine (ADDERALL) 30 MG tablet Take 15 mg by mouth 2 (two) times daily with breakfast and lunch. IN THE MORNING & AT NOON.    ? aspirin 325 MG EC tablet Take 325 mg by mouth daily.    ? buPROPion (WELLBUTRIN XL) 150 MG 24 hr tablet Take 150 mg by mouth 2 (two) times daily.     ? cholecalciferol (VITAMIN D3) 25 MCG (1000 UNIT) tablet Take 2,000 Units by mouth daily.    ? cycloSPORINE (RESTASIS) 0.05 % ophthalmic emulsion Place 1-2 drops into both eyes 2 (two) times daily as needed (dry eyes).     ? fluticasone (FLONASE) 50 MCG/ACT nasal spray fluticasone propionate 50 mcg/actuation nasal spray,suspension ? INSTILL 1 SPRAY TWICE A DAY AS DIRECTED    ? folic acid (FOLVITE) 935 MCG tablet Take 400 mcg by mouth daily.    ? furosemide (LASIX) 20 MG tablet Take 20 mg by mouth daily.    ? isosorbide mononitrate (IMDUR) 30 MG 24 hr tablet Take 30 mg by mouth daily.  3  ? metoprolol succinate (TOPROL-XL) 50 MG 24 hr tablet Take 50 mg by mouth daily.    ? omeprazole (PRILOSEC) 40 MG capsule Take 40 mg by mouth as needed.    ? predniSONE (DELTASONE) 5 MG tablet Take 5 mg by mouth daily.    ? pregabalin (LYRICA) 200 MG capsule Take 200 mg by mouth as needed.    ? sertraline (ZOLOFT) 100 MG tablet Take 200 mg by mouth in the morning and at bedtime.    ? Tretinoin 0.05 % LOTN Apply 1 application. topically at bedtime.    ? mometasone (ELOCON) 0.1 % ointment Apply 1 application topically daily as needed (for eczema).  (Patient not taking: Reported on 02/02/2022)    ? aspirin 81 MG chewable tablet Chew 81 mg by mouth daily.    ? atorvastatin (LIPITOR) 10 MG tablet Take 1 tablet (10 mg total) by mouth daily. (Patient  not taking: Reported on 02/02/2022) 30 tablet 1  ? gabapentin (NEURONTIN) 800 MG tablet Take 400 mg by mouth 3 (three) times daily.     ? nicotine (NICODERM CQ - DOSED IN MG/24 HOURS) 14 mg/24hr patch Plac

## 2022-02-02 ENCOUNTER — Encounter: Payer: Self-pay | Admitting: Student

## 2022-02-02 ENCOUNTER — Ambulatory Visit: Payer: Medicare Other | Admitting: Student

## 2022-02-02 VITALS — BP 156/93 | HR 70 | Temp 98.0°F | Resp 17 | Ht 62.0 in | Wt 191.4 lb

## 2022-02-02 DIAGNOSIS — I10 Essential (primary) hypertension: Secondary | ICD-10-CM

## 2022-02-02 DIAGNOSIS — R0989 Other specified symptoms and signs involving the circulatory and respiratory systems: Secondary | ICD-10-CM

## 2022-02-02 DIAGNOSIS — R0609 Other forms of dyspnea: Secondary | ICD-10-CM

## 2022-02-02 MED ORDER — LOSARTAN POTASSIUM 50 MG PO TABS: 50.0000 mg | ORAL_TABLET | Freq: Every day | ORAL | 3 refills | Status: DC

## 2022-02-02 MED ORDER — ATORVASTATIN CALCIUM 10 MG PO TABS: 10.0000 mg | ORAL_TABLET | Freq: Every day | ORAL | 3 refills | Status: DC

## 2022-02-10 ENCOUNTER — Other Ambulatory Visit: Payer: Medicare Other

## 2022-02-15 ENCOUNTER — Ambulatory Visit: Payer: Medicare Other

## 2022-02-15 DIAGNOSIS — R0609 Other forms of dyspnea: Secondary | ICD-10-CM

## 2022-02-17 ENCOUNTER — Other Ambulatory Visit: Payer: Self-pay | Admitting: Physician Assistant

## 2022-02-17 DIAGNOSIS — Z1231 Encounter for screening mammogram for malignant neoplasm of breast: Secondary | ICD-10-CM

## 2022-02-21 ENCOUNTER — Ambulatory Visit
Admission: RE | Admit: 2022-02-21 | Discharge: 2022-02-21 | Disposition: A | Discharge status: Home / Self Care | Payer: Medicare Other | Source: Ambulatory Visit

## 2022-02-21 DIAGNOSIS — Z1231 Encounter for screening mammogram for malignant neoplasm of breast: Secondary | ICD-10-CM

## 2022-02-23 LAB — LIPID PANEL WITH LDL/HDL RATIO
Cholesterol, Total: 152 mg/dL (ref 100–199)
HDL: 47 mg/dL (ref >39)
LDL Chol Calc (NIH): 82 mg/dL (ref 0–99)
LDL/HDL Ratio: 1.7 ratio (ref 0.0–3.2)
Triglycerides: 131 mg/dL (ref 0–149)
VLDL Cholesterol Cal: 23 mg/dL (ref 5–40)

## 2022-02-23 LAB — BASIC METABOLIC PANEL
BUN/Creatinine Ratio: 16 (ref 9–23)
BUN: 15 mg/dL (ref 6–24)
CO2: 23 mmol/L (ref 20–29)
Calcium: 9.2 mg/dL (ref 8.7–10.2)
Chloride: 103 mmol/L (ref 96–106)
Creatinine, Ser: 0.92 mg/dL (ref 0.57–1.00)
Glucose: 96 mg/dL (ref 70–99)
Potassium: 4.9 mmol/L (ref 3.5–5.2)
Sodium: 141 mmol/L (ref 134–144)
eGFR: 72 mL/min/{1.73_m2} (ref >59)

## 2022-03-01 ENCOUNTER — Ambulatory Visit: Payer: Medicare Other

## 2022-03-01 DIAGNOSIS — R0989 Other specified symptoms and signs involving the circulatory and respiratory systems: Secondary | ICD-10-CM

## 2022-03-01 DIAGNOSIS — R0609 Other forms of dyspnea: Secondary | ICD-10-CM

## 2022-03-09 ENCOUNTER — Encounter: Payer: Self-pay | Admitting: Student

## 2022-03-09 ENCOUNTER — Ambulatory Visit: Payer: Medicare Other | Admitting: Student

## 2022-03-09 VITALS — BP 122/73 | HR 89 | Temp 97.8°F | Resp 17 | Ht 62.0 in | Wt 187.0 lb

## 2022-03-09 DIAGNOSIS — I10 Essential (primary) hypertension: Secondary | ICD-10-CM

## 2022-03-09 DIAGNOSIS — R946 Abnormal results of thyroid function studies: Secondary | ICD-10-CM

## 2022-03-09 DIAGNOSIS — R0609 Other forms of dyspnea: Secondary | ICD-10-CM

## 2022-03-09 NOTE — Progress Notes (Signed)
Primary Physician/Referring:  Milford Cage, PA  Patient ID: Amanda Cardenas, female    DOB: Dec 23, 1965, 56 y.o.   MRN: [id number]  Chief Complaint  Patient presents with   CARDIAC TEST RESULTS   Hypertension    5 WEEKS   HPI:      HPI: Amanda Cardenas  is a 57 y.o. 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  Patient follows with pain management for chronic back, neck, and leg pain.  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  Patient was last seen in the office 02/02/2022 with complaints of dyspnea on exertion and fatigue, therefore ordered echocardiogram and stress test as well as carotid artery duplex.  At last office visit refilled statin therapy and ordered repeat lipid profile testing, also had losartan 50 mg p.o. daily given uncontrolled hypertension.  Repeat BMP remained stable and lipids are fairly well controlled, although LDL remains >70.  Stress test was overall low risk.  Echocardiogram revealed LVEF >70%, otherwise normal echo.  Carotid duplex revealed no carotid stenosis.  Patient now presents for 4-week follow-up. She reports fatigue and dyspnea on exertion are unchanged compared to last office visit.   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 ([place]) 10/25/2017   Baker cyst    Barrett's esophagus    Colon polyps    Gout    H/O colonoscopy 08/2016   Hx of cardiac  cath 10/25/2017   NO INTERVENTION   Hypertension    Hyperthyroidism    Migraines    Vitamin D deficiency    Past Surgical History:  Procedure Laterality Date   ABDOMINAL HYSTERECTOMY     APPENDECTOMY     carpal tunnel repair     ENDOVENOUS ABLATION SAPHENOUS VEIN W/ LASER Left 09/27/2017   endovenous laser ablation left greater saphenous vein and stab phlebectomy > 20 incisions left leg by Tinnie Gens MD   [place]   LEFT HEART CATH AND CORONARY ANGIOGRAPHY N/A 10/25/2017   Procedure: LEFT HEART CATH AND CORONARY ANGIOGRAPHY;  Surgeon: Adrian Prows, MD;  Location: [facility];  Service: Cardiovascular;  Laterality: N/A;   MEDIASTERNOTOMY N/A 04/11/2018   Procedure: MEDIAN STERNOTOMY;  Surgeon: [name], MD;  Location: [facility];  Service: Thoracic;  Laterality: N/A;   THYMECTOMY N/A 04/11/2018   Procedure: THYMECTOMY;  Surgeon: [name], MD;  Location: [facility];  Service: Thoracic;  Laterality: N/A;   TONSILLECTOMY     Social History   Tobacco Use   Smoking status: Former    Packs/day: 0.25    Years: 25.00    Total pack years: 6.25    Types: E-cigarettes, Cigarettes    Quit date: 05/2019    Years since quitting: 2.8   Smokeless tobacco: Never   Tobacco comments:    trying to quit, 3 days/week patient uses vape about 3 times a day when craving nicotine   Substance Use Topics   Alcohol use: No   Marital  status: Single  ROS  Review of Systems  Constitutional: Positive for malaise/fatigue and weight gain.  Cardiovascular:  Positive for dyspnea on exertion. Negative for chest pain, claudication, leg swelling, near-syncope, orthopnea, palpitations, paroxysmal nocturnal dyspnea and syncope.  Neurological:  Negative for dizziness.   Objective  Blood pressure 122/73, pulse 89, temperature 97.8 F (36.6 C), temperature source Temporal, resp. rate 17, height '5\' 2"'$  (1.575 m), weight 187 lb (84.8 kg), last menstrual period 10/18/2011, SpO2  95 %. Body mass index is 34.2 kg/m.   Physical Exam Vitals reviewed.  Constitutional:      Appearance: She is well-developed.  Cardiovascular:     Rate and Rhythm: Normal rate and regular rhythm.     Pulses: Intact distal pulses.          Carotid pulses are  on the left side with bruit.    Heart sounds: Normal heart sounds.  Pulmonary:     Effort: Pulmonary effort is normal. No accessory muscle usage or respiratory distress.     Breath sounds: Normal breath sounds.  Musculoskeletal:     Right lower leg: No edema.     Left lower leg: No edema.  Neurological:     Mental Status: She is alert.   Physical exam unchanged compared to previous office visit.  Laboratory examination:   Recent Labs    02/22/22 1338  NA 141  K 4.9  CL 103  CO2 23  GLUCOSE 96  BUN 15  CREATININE 0.92  CALCIUM 9.2      Latest Ref Rng & Units 02/22/2022    1:38 PM 04/13/2018    2:49 AM 04/12/2018    2:28 AM  CMP  Glucose 70 - 99 mg/dL 96  107  125   BUN 6 - 24 mg/dL '15  17  13   '$ Creatinine 0.57 - 1.00 mg/dL 0.92  0.87  0.80   Sodium 134 - 144 mmol/L 141  141  141   Potassium 3.5 - 5.2 mmol/L 4.9  4.6  4.4   Chloride 96 - 106 mmol/L 103  103  105   CO2 20 - 29 mmol/L '23  30  27   '$ Calcium 8.7 - 10.2 mg/dL 9.2  9.3  9.4       Latest Ref Rng & Units 04/13/2018    2:49 AM 04/12/2018    2:28 AM 04/10/2018   10:35 AM  CBC  WBC 4.0 - 10.5 K/uL 13.4  21.1  9.5   Hemoglobin 12.0 - 15.0 g/dL 12.5  14.2  13.5   Hematocrit 36.0 - 46.0 % 40.2  44.8  42.4   Platelets 150 - 400 K/uL 242  369  276    Lipid Panel     Component Value Date/Time   CHOL 152 02/22/2022 1338   TRIG 131 02/22/2022 1338   HDL 47 02/22/2022 1338   CHOLHDL 3.5 10/25/2017 0617   VLDL 43 (H) 10/25/2017 0617   LDLCALC 82 02/22/2022 1338   HEMOGLOBIN A1C No results found for: "HGBA1C", "MPG" TSH No results for input(s): "TSH" in the last 8760 hours.  External Labs:  11/17/2021: BUN 14, creatinine 1.12, GFR 57, sodium 141,  potassium 4.7  Allergies   Allergies  Allergen Reactions   Zofran [Ondansetron Hcl] Hives   Tramadol Nausea Only    *ANALGESICS-OPIOIDS*   Prednisone Other (See Comments)    Pt reports hyperactivity, "makes me crazy"    Medications Prior to Visit:   Outpatient Medications Prior to Visit  Medication Sig Dispense Refill   albuterol (PROVENTIL HFA;VENTOLIN HFA) 108 (90 Base) MCG/ACT inhaler Inhale 2 puffs into the lungs every 6 (six) hours as needed for wheezing or shortness of breath.      amLODipine (NORVASC) 10 MG tablet Take 10 mg by mouth daily.     amphetamine-dextroamphetamine (ADDERALL) 30 MG tablet Take 15 mg by mouth 2 (two) times daily with breakfast and lunch. IN THE MORNING & AT NOON.     aspirin 325 MG EC tablet Take 325 mg by mouth daily.     atorvastatin (LIPITOR) 10 MG tablet Take 1 tablet (10 mg total) by mouth daily. 90 tablet 3   buPROPion (WELLBUTRIN XL) 150 MG 24 hr tablet Take 150 mg by mouth 2 (two) times daily.      cycloSPORINE (RESTASIS) 0.05 % ophthalmic emulsion Place 1-2 drops into both eyes 2 (two) times daily as needed (dry eyes).      fluticasone (FLONASE) 50 MCG/ACT nasal spray fluticasone propionate 50 mcg/actuation nasal spray,suspension  INSTILL 1 SPRAY TWICE A DAY AS DIRECTED     folic acid (FOLVITE) 025 MCG tablet Take 400 mcg by mouth daily.     furosemide (LASIX) 20 MG tablet Take 20 mg by mouth daily.     isosorbide mononitrate (IMDUR) 30 MG 24 hr tablet Take 30 mg by mouth daily.  3   losartan (COZAAR) 50 MG tablet Take 1 tablet (50 mg total) by mouth daily. 90 tablet 3   metoprolol succinate (TOPROL-XL) 50 MG 24 hr tablet Take 100 mg by mouth daily.     mometasone (ELOCON) 0.1 % ointment Apply 1 application. topically daily as needed (for eczema).     omeprazole (PRILOSEC) 40 MG capsule Take 40 mg by mouth as needed.     oxyCODONE (ROXICODONE) 15 MG immediate release tablet Take 15 mg by mouth 4 (four) times daily as needed.     predniSONE  (DELTASONE) 5 MG tablet Take 5 mg by mouth daily.     pregabalin (LYRICA) 200 MG capsule Take 200 mg by mouth as needed.     sertraline (ZOLOFT) 100 MG tablet Take 200 mg by mouth in the morning and at bedtime.     Tretinoin 0.05 % LOTN Apply 1 application. topically at bedtime.     cholecalciferol (VITAMIN D3) 25 MCG (1000 UNIT) tablet Take 2,000 Units by mouth daily. (Patient not taking: Reported on 03/09/2022)     No facility-administered medications prior to visit.   Final Medications at End of Visit    Current Meds  Medication Sig   albuterol (PROVENTIL HFA;VENTOLIN HFA) 108 (90 Base) MCG/ACT inhaler Inhale 2 puffs into the lungs every 6 (six) hours as needed for wheezing or shortness of breath.    amLODipine (NORVASC) 10 MG tablet Take 10 mg by mouth daily.   amphetamine-dextroamphetamine (ADDERALL) 30 MG tablet Take 15 mg by mouth 2 (two) times daily with breakfast and lunch. IN THE MORNING & AT NOON.   aspirin 325 MG EC tablet Take 325 mg by mouth daily.   atorvastatin (LIPITOR) 10 MG tablet Take 1 tablet (10 mg total) by mouth daily.   buPROPion (WELLBUTRIN XL) 150 MG 24 hr tablet Take 150 mg by mouth 2 (two) times daily.    cycloSPORINE (RESTASIS) 0.05 % ophthalmic emulsion Place 1-2 drops into both eyes 2 (two) times daily as needed (dry eyes).    fluticasone (FLONASE) 50 MCG/ACT nasal spray fluticasone propionate 50 mcg/actuation nasal spray,suspension  INSTILL 1 SPRAY  TWICE A DAY AS DIRECTED   folic acid (FOLVITE) 174 MCG tablet Take 400 mcg by mouth daily.   furosemide (LASIX) 20 MG tablet Take 20 mg by mouth daily.   isosorbide mononitrate (IMDUR) 30 MG 24 hr tablet Take 30 mg by mouth daily.   losartan (COZAAR) 50 MG tablet Take 1 tablet (50 mg total) by mouth daily.   metoprolol succinate (TOPROL-XL) 50 MG 24 hr tablet Take 100 mg by mouth daily.   mometasone (ELOCON) 0.1 % ointment Apply 1 application. topically daily as needed (for eczema).   omeprazole (PRILOSEC) 40 MG  capsule Take 40 mg by mouth as needed.   oxyCODONE (ROXICODONE) 15 MG immediate release tablet Take 15 mg by mouth 4 (four) times daily as needed.   predniSONE (DELTASONE) 5 MG tablet Take 5 mg by mouth daily.   pregabalin (LYRICA) 200 MG capsule Take 200 mg by mouth as needed.   sertraline (ZOLOFT) 100 MG tablet Take 200 mg by mouth in the morning and at bedtime.   Tretinoin 0.05 % LOTN Apply 1 application. topically at bedtime.   Radiology:    No results found.  Cardiac Studies:  Carotid artery duplex 03/01/2022:  No hemodynamically significant arterial disease in the internal carotid  artery bilaterally. No significant plaque noted in the study.  Antegrade right vertebral artery flow. Antegrade left vertebral artery  flow.  Echocardiogram 03/01/2022:  Hyperdynamic LV systolic function with visual EF >70%. Left ventricle  cavity is normal in size. Normal left ventricular wall thickness. Normal  global wall motion. Normal diastolic filling pattern, normal LAP.  Mild tricuspid regurgitation. No evidence of pulmonary hypertension. RVSP  measures 32 mmHg.  Compared to 10/26/2017 LVEF is now hyperdynamic otherwise no significant  change.   Lexiscan/modified Bruce Tetrofosmin stress test 02/15/2022: Lexiscan/modified Bruce nuclear stress test performed using 1-day protocol. Patient exercised for 4.7 METS and reached 70% MPHR.  Patient was hypertensive throughout the study, BP ranging from 160-180/80 mmHg.  Stress EKG is non-diagnostic, as this is pharmacological stress test. In addition, stress EKG at 70% MPHR showed sinus tachycardia, no significant ST-T abnormality.  Mild soft tissue attenuation in inferior myocardium without any definite evidence of ischemia/ infarction. Stress LVEF 79%. Low risk study  Coronary angiogram 10/25/2017: Normal LV systolic function without wall motion abnormality. Left dominant circulation, tortuous coronary arteries but no significant disease suggestive  of hypertensive heart disease. Small nondominant RCA with anterior origin. Ascending aortogram reveals presence of 3 aortic valve cusps without aortic regurgitation, no dissection was evident, no aneurysm.  EKG  02/02/2022: Sinus rhythm at a rate of 64 bpm.  Normal axis.  Borderline left atrial enlargement.  No evidence of ischemia or underlying injury pattern.  Compared EKG 06/16/2019, no significant change.  EKG 06/16/2019: Normal sinus rhythm at 72 bpm, normal axis, no evidence of ischemia.   Assessment     ICD-10-CM   1. Essential hypertension  I10     2. Dyspnea on exertion  R06.09     3. Abnormal thyroid function test  R94.6 Ambulatory referral to Endocrinology      Recommendations:   LAKISHA PEYSER  is a 56 y.o. 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  Patient follows with pain management for chronic back, neck, and leg pain.  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  Patient was  previously followed in our office given coronary artery disease status post NSTEMI 10/2017, subsequent coronary angiography showed normal coronary arteries and she was suspected to have coronary vasospasm.  Patient was last seen in the office 02/02/2022 with complaints of dyspnea on exertion and fatigue, therefore ordered echocardiogram and stress test as well as carotid artery duplex.  At last office visit refilled statin therapy and ordered repeat lipid profile testing, also had losartan 50 mg p.o. daily given uncontrolled hypertension.  Repeat BMP remained stable and lipids are fairly well controlled, although LDL remains >70.  Stress test was overall low risk.  Echocardiogram revealed LVEF >70%, otherwise normal echo.  Carotid duplex revealed no carotid stenosis.  Patient now presents  for 4-week follow-up.  Reviewed and discussed results of echocardiogram, stress test, carotid duplex, patient's questions were answered.  Continue losartan and amlodipine.  In view of hyperdynamic LV Will increase metoprolol succinate from 50 mg to 100 mg p.o. daily.  Counseled patient regarding importance of increased physical activity as well as weight loss.  Advised patient to follow-up with PCP for further evaluation of noncardiac causes of dyspnea and fatigue.  She is also requesting referral for second opinion from endocrinology regarding thyroid dysfunction.  Follow-up in 6 months, sooner if needed.   Alethia Berthold, PA-C 03/09/2022, 11:03 AM Office: [phone]

## 2022-04-13 ENCOUNTER — Inpatient Hospital Stay (HOSPITAL_COMMUNITY)
Admission: EM | Admit: 2022-04-13 | Discharge: 2022-04-15 | DRG: 812 | Disposition: A | Discharge status: Home / Self Care | Payer: Medicare Other | Attending: Internal Medicine | Admitting: Internal Medicine

## 2022-04-13 ENCOUNTER — Other Ambulatory Visit: Payer: Self-pay

## 2022-04-13 ENCOUNTER — Encounter (HOSPITAL_COMMUNITY): Payer: Self-pay | Admitting: Emergency Medicine

## 2022-04-13 DIAGNOSIS — Z7952 Long term (current) use of systemic steroids: Secondary | ICD-10-CM

## 2022-04-13 DIAGNOSIS — D649 Anemia, unspecified: Secondary | ICD-10-CM | POA: Diagnosis not present

## 2022-04-13 DIAGNOSIS — K2270 Barrett's esophagus without dysplasia: Secondary | ICD-10-CM

## 2022-04-13 DIAGNOSIS — R231 Pallor: Secondary | ICD-10-CM | POA: Diagnosis present

## 2022-04-13 DIAGNOSIS — D509 Iron deficiency anemia, unspecified: Principal | ICD-10-CM | POA: Diagnosis present

## 2022-04-13 DIAGNOSIS — I2510 Atherosclerotic heart disease of native coronary artery without angina pectoris: Secondary | ICD-10-CM | POA: Diagnosis not present

## 2022-04-13 DIAGNOSIS — K3189 Other diseases of stomach and duodenum: Secondary | ICD-10-CM

## 2022-04-13 DIAGNOSIS — E7800 Pure hypercholesterolemia, unspecified: Secondary | ICD-10-CM | POA: Diagnosis present

## 2022-04-13 DIAGNOSIS — Z7982 Long term (current) use of aspirin: Secondary | ICD-10-CM

## 2022-04-13 DIAGNOSIS — N179 Acute kidney failure, unspecified: Secondary | ICD-10-CM

## 2022-04-13 DIAGNOSIS — E559 Vitamin D deficiency, unspecified: Secondary | ICD-10-CM | POA: Diagnosis present

## 2022-04-13 DIAGNOSIS — Z79899 Other long term (current) drug therapy: Secondary | ICD-10-CM

## 2022-04-13 DIAGNOSIS — J45909 Unspecified asthma, uncomplicated: Secondary | ICD-10-CM | POA: Diagnosis present

## 2022-04-13 DIAGNOSIS — Z90710 Acquired absence of both cervix and uterus: Secondary | ICD-10-CM

## 2022-04-13 DIAGNOSIS — M109 Gout, unspecified: Secondary | ICD-10-CM | POA: Diagnosis present

## 2022-04-13 DIAGNOSIS — K219 Gastro-esophageal reflux disease without esophagitis: Secondary | ICD-10-CM | POA: Diagnosis present

## 2022-04-13 DIAGNOSIS — Z9049 Acquired absence of other specified parts of digestive tract: Secondary | ICD-10-CM

## 2022-04-13 DIAGNOSIS — K319 Disease of stomach and duodenum, unspecified: Secondary | ICD-10-CM | POA: Diagnosis present

## 2022-04-13 DIAGNOSIS — Z8249 Family history of ischemic heart disease and other diseases of the circulatory system: Secondary | ICD-10-CM

## 2022-04-13 DIAGNOSIS — Q892 Congenital malformations of other endocrine glands: Secondary | ICD-10-CM

## 2022-04-13 DIAGNOSIS — Z87891 Personal history of nicotine dependence: Secondary | ICD-10-CM

## 2022-04-13 DIAGNOSIS — G43909 Migraine, unspecified, not intractable, without status migrainosus: Secondary | ICD-10-CM | POA: Diagnosis present

## 2022-04-13 DIAGNOSIS — Z888 Allergy status to other drugs, medicaments and biological substances status: Secondary | ICD-10-CM

## 2022-04-13 DIAGNOSIS — I10 Essential (primary) hypertension: Secondary | ICD-10-CM | POA: Diagnosis not present

## 2022-04-13 DIAGNOSIS — Z86010 Personal history of colonic polyps: Secondary | ICD-10-CM

## 2022-04-13 LAB — CBC
HCT: 27.2 % — ABNORMAL LOW (ref 36.0–46.0)
Hemoglobin: 7.3 g/dL — ABNORMAL LOW (ref 12.0–15.0)
MCH: 20.4 pg — ABNORMAL LOW (ref 26.0–34.0)
MCHC: 26.8 g/dL — ABNORMAL LOW (ref 30.0–36.0)
MCV: 76 fL — ABNORMAL LOW (ref 80.0–100.0)
Platelets: 385 10*3/uL (ref 150–400)
RBC: 3.58 MIL/uL — ABNORMAL LOW (ref 3.87–5.11)
RDW: 17.2 % — ABNORMAL HIGH (ref 11.5–15.5)
WBC: 10.4 10*3/uL (ref 4.0–10.5)
nRBC: 0 % (ref 0.0–0.2)

## 2022-04-13 LAB — CBC WITH DIFFERENTIAL/PLATELET
Abs Immature Granulocytes: 0.04 10*3/uL (ref 0.00–0.07)
Basophils Absolute: 0.1 10*3/uL (ref 0.0–0.1)
Basophils Relative: 1 %
Eosinophils Absolute: 0.3 10*3/uL (ref 0.0–0.5)
Eosinophils Relative: 3 %
HCT: 28.6 % — ABNORMAL LOW (ref 36.0–46.0)
Hemoglobin: 8.1 g/dL — ABNORMAL LOW (ref 12.0–15.0)
Immature Granulocytes: 0 %
Lymphocytes Relative: 20 %
Lymphs Abs: 2.2 10*3/uL (ref 0.7–4.0)
MCH: 21.7 pg — ABNORMAL LOW (ref 26.0–34.0)
MCHC: 28.3 g/dL — ABNORMAL LOW (ref 30.0–36.0)
MCV: 76.7 fL — ABNORMAL LOW (ref 80.0–100.0)
Monocytes Absolute: 1 10*3/uL (ref 0.1–1.0)
Monocytes Relative: 9 %
Neutro Abs: 7.4 10*3/uL (ref 1.7–7.7)
Neutrophils Relative %: 67 %
Platelets: 386 10*3/uL (ref 150–400)
RBC: 3.73 MIL/uL — ABNORMAL LOW (ref 3.87–5.11)
RDW: 17.5 % — ABNORMAL HIGH (ref 11.5–15.5)
WBC: 11 10*3/uL — ABNORMAL HIGH (ref 4.0–10.5)
nRBC: 0 % (ref 0.0–0.2)

## 2022-04-13 LAB — IRON AND TIBC
Iron: 14 ug/dL — ABNORMAL LOW (ref 28–170)
Saturation Ratios: 3 % — ABNORMAL LOW (ref 10.4–31.8)
TIBC: 489 ug/dL — ABNORMAL HIGH (ref 250–450)
UIBC: 475 ug/dL

## 2022-04-13 LAB — BASIC METABOLIC PANEL
Anion gap: 7 (ref 5–15)
BUN: 16 mg/dL (ref 6–20)
CO2: 26 mmol/L (ref 22–32)
Calcium: 9.1 mg/dL (ref 8.9–10.3)
Chloride: 108 mmol/L (ref 98–111)
Creatinine, Ser: 1.06 mg/dL — ABNORMAL HIGH (ref 0.44–1.00)
GFR, Estimated: >60 mL/min (ref >60)
Glucose, Bld: 102 mg/dL — ABNORMAL HIGH (ref 70–99)
Potassium: 4.3 mmol/L (ref 3.5–5.1)
Sodium: 141 mmol/L (ref 135–145)

## 2022-04-13 LAB — FERRITIN: Ferritin: 2 ng/mL — ABNORMAL LOW (ref 11–307)

## 2022-04-13 LAB — POC OCCULT BLOOD, ED: Fecal Occult Bld: NEGATIVE

## 2022-04-13 LAB — PREPARE RBC (CROSSMATCH)

## 2022-04-13 MED ORDER — SODIUM CHLORIDE 0.9 % IV SOLN
10.0000 mL/h | Freq: Once | INTRAVENOUS | Status: DC
Start: 2022-04-13 — End: 2022-04-15

## 2022-04-13 MED ORDER — SODIUM CHLORIDE 0.9 % IV SOLN
1000.0000 mg | Freq: Once | INTRAVENOUS | Status: AC
  Administered 2022-04-14: 1000 mg via INTRAVENOUS
  Filled 2022-04-13: qty 20

## 2022-04-13 NOTE — ED Provider Triage Note (Signed)
Emergency Medicine Provider Triage Evaluation Note  Amanda Cardenas , a 56 y.o. female  was evaluated in triage.  Pt complains of abnormal lab.  Patient reports that she was called earlier today by her doctor stating that she had low iron.  Patient reports that over the last few months she has been short of breath, fatigued, lightheaded and dizzy.  The patient denies blood thinners.  Patient denies blood in stool.  Review of Systems  Positive:  Negative:   Physical Exam  BP 116/69   Pulse 71   Temp 98.9 F (37.2 C) (Oral)   Resp 18   Ht '5\' 2"'$  (1.575 m)   Wt 84.8 kg   LMP 10/18/2011 (LMP Unknown)   SpO2 97%   BMI 34.20 kg/m  Gen:   Awake, no distress   Resp:  Normal effort  MSK:   Moves extremities without difficulty  Other:    Medical Decision Making  Medically screening exam initiated at 2:57 PM.  Appropriate orders placed.  Amanda Cardenas was informed that the remainder of the evaluation will be completed by another provider, this initial triage assessment does not replace that evaluation, and the importance of remaining in the ED until their evaluation is complete.     Azucena Cecil, PA-C 04/13/22 1458

## 2022-04-13 NOTE — H&P (Signed)
History and Physical    PatientBobette Cardenas GQQ:[id number] DOB: Jul 22, 1966 DOA: 04/13/2022 DOS: the patient was seen and examined on 04/14/2022 PCP: Milford Cage, PA  Patient coming from: Home  Chief Complaint:  Chief Complaint  Patient presents with   Abnormal Lab   Shortness of Breath   HPI: Amanda Cardenas is a 56 y.o. female with medical history significant of NSTEMI, hypertension, asthma, Barrett's esophagus, hiatial hernia, GERD, hypothyroidism, hyperlipidemia who presents with concerns of shortness of breath and fatigue.   Has felt very weak and fatigue sometimes sleeping for days for more than 6 months. Short of breath with just leaning over. Has seen her PCP, cardiology and endocrinology but could not find cause. Also has heartburn and burping. Has abdominal bloating with diffuse abdominal pain. Has been taking alka seltzer x 2 daily but has held her aspirin while on it. Has seen one episode of dark stool several days ago but none since. No other changes in stool consistency or frequency.   She had annual physical this week and finally had blood work returning with low Hgb and advised to come to ED. States she has never been anemic before. No longer has menstrual cycles. Previous hx of hiatal hernia repair and reportedly continues to have one on repeat endoscopy 2 years ago. Also had colonoscopy around the same time with 2 polyps. Sees Dr. Collene Mares.   In the ED, she was afebrile, normotensive, and on room air.  Hemoglobin of 7.3 with no recent prior for comparison. Has AKI with creatinine of 1.06 and BUN of 16. FOBT negative with brown stool per ED physician.   She was given 1u pRBC transfusion and hospitalist was called for admission.    Review of Systems: As mentioned in the history of present illness. All other systems reviewed and are negative. Past Medical History:  Diagnosis Date   Abnormal thyroid ultrasound 02/08/2017   multinodular goiter   Acid reflux    Arthritis     Atherosclerosis of native coronary artery with angina pectoris with documented spasm ([place]) 10/25/2017   Baker cyst    Barrett's esophagus    Colon polyps    Gout    H/O colonoscopy 08/2016   Hx of cardiac cath 10/25/2017   NO INTERVENTION   Hypertension    Hyperthyroidism    Migraines    Vitamin D deficiency    Past Surgical History:  Procedure Laterality Date   ABDOMINAL HYSTERECTOMY     APPENDECTOMY     carpal tunnel repair     ENDOVENOUS ABLATION SAPHENOUS VEIN W/ LASER Left 09/27/2017   endovenous laser ablation left greater saphenous vein and stab phlebectomy > 20 incisions left leg by Tinnie Gens MD   [place]   LEFT HEART CATH AND CORONARY ANGIOGRAPHY N/A 10/25/2017   Procedure: LEFT HEART CATH AND CORONARY ANGIOGRAPHY;  Surgeon: Adrian Prows, MD;  Location: [place] [facility];  Service: Cardiovascular;  Laterality: N/A;   MEDIASTERNOTOMY N/A 04/11/2018   Procedure: MEDIAN STERNOTOMY;  Surgeon: [name], MD;  Location: [place];  Service: Thoracic;  Laterality: N/A;   THYMECTOMY N/A 04/11/2018   Procedure: THYMECTOMY;  Surgeon: [name], MD;  Location: [place];  Service: Thoracic;  Laterality: N/A;   TONSILLECTOMY     Social History:  reports that she quit smoking about 2 years ago. Her smoking use included e-cigarettes and cigarettes. She has a 6.25 pack-year smoking history. She has never used smokeless tobacco. She  reports that she does not drink alcohol and does not use drugs.  Allergies  Allergen Reactions   Zofran [Ondansetron Hcl] Hives   Tramadol Nausea Only    *ANALGESICS-OPIOIDS*   Prednisone Other (See Comments)    Pt reports hyperactivity, "makes me crazy"    Family History  Problem Relation Age of Onset   Hypertension Mother    Hyperlipidemia Mother    Diabetes Mellitus II Mother    Heart disease Mother    Depression Mother    Thyroid disease Cousin     Prior to Admission medications   Medication Sig Start Date  End Date Taking? Authorizing Provider  albuterol (PROVENTIL HFA;VENTOLIN HFA) 108 (90 Base) MCG/ACT inhaler Inhale 2 puffs into the lungs every 6 (six) hours as needed for wheezing or shortness of breath.    Yes [provider]  amLODipine (NORVASC) 10 MG tablet Take 10 mg by mouth daily.   Yes [provider]  amphetamine-dextroamphetamine (ADDERALL) 30 MG tablet Take 15 mg by mouth 2 (two) times daily with breakfast and lunch. IN THE MORNING & AT NOON.   Yes [provider]  aspirin 325 MG EC tablet Take 325 mg by mouth daily.   Yes [provider]  atorvastatin (LIPITOR) 10 MG tablet Take 1 tablet (10 mg total) by mouth daily. 02/02/22 02/02/23 Yes Cantwell, Celeste C, PA-C  buPROPion (WELLBUTRIN XL) 150 MG 24 hr tablet Take 150 mg by mouth 2 (two) times daily.    Yes [provider]  fluticasone (FLONASE) 50 MCG/ACT nasal spray Place 1 spray into both nostrils daily.   Yes [provider]  furosemide (LASIX) 20 MG tablet Take 20 mg by mouth daily as needed for fluid.   Yes [provider]  isosorbide mononitrate (IMDUR) 30 MG 24 hr tablet Take 30 mg by mouth daily. 03/10/18  Yes [provider]  losartan (COZAAR) 50 MG tablet Take 1 tablet (50 mg total) by mouth daily. 02/02/22 01/28/23 Yes Cantwell, Celeste C, PA-C  metoprolol succinate (TOPROL-XL) 50 MG 24 hr tablet Take 50 mg by mouth daily. 12/26/21  Yes [provider]  omeprazole (PRILOSEC) 40 MG capsule Take 40 mg by mouth as needed. 09/28/21  Yes [provider]  oxyCODONE (ROXICODONE) 15 MG immediate release tablet Take 15 mg by mouth 4 (four) times daily as needed for pain. 02/13/22  Yes [provider]  predniSONE (DELTASONE) 5 MG tablet Take 5 mg by mouth daily. 01/16/22  Yes [provider]  pregabalin (LYRICA) 200 MG capsule Take 200 mg by mouth daily as needed (For pain).   Yes [provider]  sertraline (ZOLOFT) 100 MG tablet  Take 200 mg by mouth daily. 01/01/22  Yes [provider]    Physical Exam: Vitals:   04/13/22 2245 04/13/22 2300 04/13/22 2315 04/13/22 2330  BP: 100/69 105/69 134/89 134/87  Pulse: 71 68 66 65  Resp: '11 13 14 14  '$ Temp:      TempSrc:      SpO2: 93% 94% 96% 95%  Weight:      Height:       Constitutional: NAD, calm, comfortable, middle-age female Eyes: lids and conjunctivae normal ENMT: Mucous membranes are moist.  Neck: normal, supple, no masses, no thyromegaly Respiratory: clear to auscultation bilaterally, no wheezing, no crackles. Normal respiratory effort.  Cardiovascular: Regular rate and rhythm, no murmurs / rubs / gallops. No extremity edema. 2+ pedal pulses. Abdomen: Soft, moderately distended, no tenderness, no masses palpated.  No rebound tenderness, guarding or rigidity bowel sounds positive.  Musculoskeletal: no clubbing / cyanosis. No joint deformity upper and lower extremities. Good ROM, no contractures. Skin: no rashes, lesions, ulcers. No induration Neurologic: CN 2-12 grossly intact. Strength 5/5 in all 4.  Psychiatric: Normal judgment and insight. Alert and oriented x 3. Normal mood. Data Reviewed:  See HPI  Assessment and Plan: * Symptomatic anemia Hgb of 7.3 on admit. Given 1 upRBC with improvement to 8.1. -iron panel consistent with iron deficiency anemia-give 1g IV iron infusion -follow repeat CBC in the morning with threshold for transfusion of Hgb <7 -Has seen dark stool recently and has been taking increase dose of alka seltzer due to abdominal bloating and reflux. Need GI consult in the morning for potential endoscopy. Keep NPO.  -Give IV PPI   HTN (hypertension) Continue amlodipine  AKI (acute kidney injury) ([place]) In the setting of acute anemia -creatinine of 1.09 up from 0.9.  -follow tomorrow after transfusion tonight  Hypercholesterolemia Continue statin  CAD (coronary artery disease) Continue Coreg, Imdur      Advance Care  Planning:   Code Status: Full Code   Consults: needs GI consult in the morning  Family Communication: Discussed with daughter at bedside  Severity of Illness: The appropriate patient status for this patient is OBSERVATION. Observation status is judged to be reasonable and necessary in order to provide the required intensity of service to ensure the patient's safety. The patient's presenting symptoms, physical exam findings, and initial radiographic and laboratory data in the context of their medical condition is felt to place them at decreased risk for further clinical deterioration. Furthermore, it is anticipated that the patient will be medically stable for discharge from the hospital within 2 midnights of admission.   Author: Orene Desanctis, DO 04/14/2022 12:16 AM  For on call review [email or web address].

## 2022-04-13 NOTE — ED Triage Notes (Signed)
Patient states she was sent by her PCP for low iron and needing a blood transfusion. Patient c/o shortness of breath, fatigue and feeling very anxious for the past few months.

## 2022-04-13 NOTE — ED Provider Notes (Signed)
[facility] DEPT Provider Note   CSN: [id number] Arrival date & time: 04/13/22  1334     History  Chief Complaint  Patient presents with   Abnormal Lab   Shortness of Breath    Amanda Cardenas is a 56 y.o. female.  Patient is here with generalized weakness, shortness of breath, low hemoglobin.  Patient's been with extreme fatigue last several weeks.  Sleeping 12 to 14 hours a day.  Very short of breath with activities.  She denies any black stools or bloody stools.  She denies any vaginal bleeding.  She is on blood thinners.  She does take aspirin.  She has had some stomach cramps but no current abdominal pain.  She was seen by her primary care doctor on Monday 4 days ago and was told that her hemoglobin was low and they were concerned that she needed emergent transfusion.  She states that she had dark stool once or twice this week but her last bowel movement her stool was brown.  States history of reflux.  No alcohol use  The history is provided by the patient.       Home Medications Prior to Admission medications   Medication Sig Start Date End Date Taking? Authorizing Provider  albuterol (PROVENTIL HFA;VENTOLIN HFA) 108 (90 Base) MCG/ACT inhaler Inhale 2 puffs into the lungs every 6 (six) hours as needed for wheezing or shortness of breath.     [provider]  amLODipine (NORVASC) 10 MG tablet Take 10 mg by mouth daily.    [provider]  amphetamine-dextroamphetamine (ADDERALL) 30 MG tablet Take 15 mg by mouth 2 (two) times daily with breakfast and lunch. IN THE MORNING & AT NOON.    [provider]  aspirin 325 MG EC tablet Take 325 mg by mouth daily.    [provider]  atorvastatin (LIPITOR) 10 MG tablet Take 1 tablet (10 mg total) by mouth daily. 02/02/22 02/02/23  Cantwell, Celeste C, PA-C  buPROPion (WELLBUTRIN XL) 150 MG 24 hr tablet Take 150 mg by mouth 2 (two) times daily.     [provider]   cholecalciferol (VITAMIN D3) 25 MCG (1000 UNIT) tablet Take 2,000 Units by mouth daily. Patient not taking: Reported on 03/09/2022    [provider]  cycloSPORINE (RESTASIS) 0.05 % ophthalmic emulsion Place 1-2 drops into both eyes 2 (two) times daily as needed (dry eyes).     [provider]  fluticasone (FLONASE) 50 MCG/ACT nasal spray fluticasone propionate 50 mcg/actuation nasal spray,suspension  INSTILL 1 SPRAY TWICE A DAY AS DIRECTED    [provider]  folic acid (FOLVITE) 458 MCG tablet Take 400 mcg by mouth daily.    [provider]  furosemide (LASIX) 20 MG tablet Take 20 mg by mouth daily.    [provider]  isosorbide mononitrate (IMDUR) 30 MG 24 hr tablet Take 30 mg by mouth daily. 03/10/18   [provider]  losartan (COZAAR) 50 MG tablet Take 1 tablet (50 mg total) by mouth daily. 02/02/22 01/28/23  Cantwell, Celeste C, PA-C  metoprolol succinate (TOPROL-XL) 50 MG 24 hr tablet Take 100 mg by mouth daily. 12/26/21   [provider]  mometasone (ELOCON) 0.1 % ointment Apply 1 application. topically daily as needed (for eczema).    [provider]  omeprazole (PRILOSEC) 40 MG capsule Take 40 mg by mouth as needed. 09/28/21   [provider]  oxyCODONE (ROXICODONE) 15 MG immediate release tablet Take 15  mg by mouth 4 (four) times daily as needed. 02/13/22   [provider]  predniSONE (DELTASONE) 5 MG tablet Take 5 mg by mouth daily. 01/16/22   [provider]  pregabalin (LYRICA) 200 MG capsule Take 200 mg by mouth as needed.    [provider]  sertraline (ZOLOFT) 100 MG tablet Take 200 mg by mouth in the morning and at bedtime. 01/01/22   [provider]  Tretinoin 0.05 % LOTN Apply 1 application. topically at bedtime.    [provider]      Allergies    Zofran Alvis Lemmings hcl], Tramadol, and Prednisone    Review of Systems   Review of Systems  Physical  Exam Updated Vital Signs BP 133/79 (BP Location: Left Arm)   Pulse 78   Temp 98.9 F (37.2 C) (Oral)   Resp 18   Ht '5\' 2"'$  (1.575 m)   Wt 84.8 kg   LMP 10/18/2011 (LMP Unknown)   SpO2 94%   BMI 34.20 kg/m  Physical Exam Vitals and nursing note reviewed.  Constitutional:      General: She is not in acute distress.    Appearance: She is well-developed.  HENT:     Head: Normocephalic and atraumatic.     Mouth/Throat:     Mouth: Mucous membranes are moist.  Eyes:     Extraocular Movements: Extraocular movements intact.     Conjunctiva/sclera: Conjunctivae normal.     Pupils: Pupils are equal, round, and reactive to light.  Cardiovascular:     Rate and Rhythm: Normal rate and regular rhythm.     Heart sounds: No murmur heard. Pulmonary:     Effort: Pulmonary effort is normal. No respiratory distress.     Breath sounds: Normal breath sounds. No decreased breath sounds or wheezing.  Abdominal:     Palpations: Abdomen is soft.     Tenderness: There is no abdominal tenderness.  Genitourinary:    Rectum: Guaiac result negative.  Musculoskeletal:        General: No swelling.     Cervical back: Normal range of motion and neck supple.     Right lower leg: No edema.     Left lower leg: No edema.  Skin:    General: Skin is warm and dry.     Capillary Refill: Capillary refill takes less than 2 seconds.     Comments: Pallor of her conjunctiva and tongue  Neurological:     Mental Status: She is alert.  Psychiatric:        Mood and Affect: Mood normal.     ED Results / Procedures / Treatments   Labs (all labs ordered are listed, but only abnormal results are displayed) Labs Reviewed  CBC - Abnormal; Notable for the following components:      Result Value   RBC 3.58 (*)    Hemoglobin 7.3 (*)    HCT 27.2 (*)    MCV 76.0 (*)    MCH 20.4 (*)    MCHC 26.8 (*)    RDW 17.2 (*)    All other components within normal limits  BASIC METABOLIC PANEL - Abnormal; Notable for the  following components:   Glucose, Bld 102 (*)    Creatinine, Ser 1.06 (*)    All other components within normal limits  FERRITIN - Abnormal; Notable for the following components:   Ferritin 2 (*)    All other components within normal limits  IRON AND TIBC - Abnormal; Notable for the following  components:   Iron 14 (*)    TIBC 489 (*)    Saturation Ratios 3 (*)    All other components within normal limits  POC OCCULT BLOOD, ED  TYPE AND SCREEN  PREPARE RBC (CROSSMATCH)    EKG EKG Interpretation  Date/Time:  Thursday April 13 2022 14:32:01 EDT Ventricular Rate:  63 PR Interval:  176 QRS Duration: 82 QT Interval:  389 QTC Calculation: 399 R Axis:   9 Text Interpretation: Sinus rhythm Confirmed by Lennice Sites ([id number]) on 04/13/2022 5:32:54 PM  Radiology No results found.  Procedures .Critical Care  Performed by: Lennice Sites, DO Authorized by: Lennice Sites, DO   Critical care provider statement:    Critical care time (minutes):  35   Critical care was necessary to treat or prevent imminent or life-threatening deterioration of the following conditions:  Circulatory failure   Critical care was time spent personally by me on the following activities:  Blood draw for specimens, development of treatment plan with patient or surrogate, discussions with primary provider, evaluation of patient's response to treatment, examination of patient, obtaining history from patient or surrogate, ordering and performing treatments and interventions, ordering and review of laboratory studies, ordering and review of radiographic studies, pulse oximetry and review of old charts   Care discussed with: admitting provider       Medications Ordered in ED Medications  0.9 %  sodium chloride infusion (has no administration in time range)    ED Course/ Medical Decision Making/ A&P                           Medical Decision Making Amount and/or Complexity of Data Reviewed Labs:  ordered.  Risk Prescription drug management.   Amanda Cardenas is here due to weakness, shortness of breath, low hemoglobin.  Vital signs are normal.  Patient states several weeks of extreme fatigue.  Sleeping 12 to 14 hours a day.  Very short of breath with exertion.  Denies any chest pain.  Had blood work done 4 days ago and was told that she had low hemoglobin and needed to come to the hospital for evaluation.  She denies any black stools or bloody stools except for 1 or 2 episodes of dark stool earlier this week.  She has been having some upper abdominal cramping at times.  History of gastritis/acid reflux.  Denies any history of bleeding ulcer.  Takes aspirin.  Denies any heavy alcohol use.  Hemoccult is negative, stool is brown.  She is not sure what her hemoglobin was.  Per my chart review she had blood work several years ago with her baseline hemoglobin being 14.  Today CBC, iron panel, BMP was collected.  Per my review and interpretation she does have a hemoglobin of 7.3.  MCV is 76.  Iron panel seems to be consistent with an iron deficiency anemia.  Otherwise lab works unremarkable.  EKG per my review and interpretation shows sinus rhythm.  Overall she is having significant symptoms of symptomatic anemia.  I have a lower suspicion that this is from a GI bleed.  Think she would benefit from blood transfusion and admission.  Possibly an iron transfusion.  Will admit to medicine team.  We will transfuse her 1 unit of packed red blood cells in the ED.  This chart was dictated using voice recognition software.  Despite best efforts to proofread,  errors can occur which can change the documentation meaning.  Final Clinical Impression(s) / ED Diagnoses Final diagnoses:  Symptomatic anemia    Rx / DC Orders ED Discharge Orders     None         Lennice Sites, DO 04/13/22 1824

## 2022-04-14 DIAGNOSIS — K3189 Other diseases of stomach and duodenum: Secondary | ICD-10-CM | POA: Diagnosis not present

## 2022-04-14 DIAGNOSIS — J45909 Unspecified asthma, uncomplicated: Secondary | ICD-10-CM | POA: Diagnosis present

## 2022-04-14 DIAGNOSIS — Q892 Congenital malformations of other endocrine glands: Secondary | ICD-10-CM | POA: Diagnosis not present

## 2022-04-14 DIAGNOSIS — Z8249 Family history of ischemic heart disease and other diseases of the circulatory system: Secondary | ICD-10-CM | POA: Diagnosis not present

## 2022-04-14 DIAGNOSIS — K2270 Barrett's esophagus without dysplasia: Secondary | ICD-10-CM | POA: Diagnosis present

## 2022-04-14 DIAGNOSIS — Z87891 Personal history of nicotine dependence: Secondary | ICD-10-CM | POA: Diagnosis not present

## 2022-04-14 DIAGNOSIS — I252 Old myocardial infarction: Secondary | ICD-10-CM | POA: Diagnosis not present

## 2022-04-14 DIAGNOSIS — R231 Pallor: Secondary | ICD-10-CM | POA: Diagnosis present

## 2022-04-14 DIAGNOSIS — G43909 Migraine, unspecified, not intractable, without status migrainosus: Secondary | ICD-10-CM | POA: Diagnosis present

## 2022-04-14 DIAGNOSIS — N179 Acute kidney failure, unspecified: Secondary | ICD-10-CM | POA: Diagnosis present

## 2022-04-14 DIAGNOSIS — Z90710 Acquired absence of both cervix and uterus: Secondary | ICD-10-CM | POA: Diagnosis not present

## 2022-04-14 DIAGNOSIS — Z79899 Other long term (current) drug therapy: Secondary | ICD-10-CM | POA: Diagnosis not present

## 2022-04-14 DIAGNOSIS — Z86010 Personal history of colonic polyps: Secondary | ICD-10-CM | POA: Diagnosis not present

## 2022-04-14 DIAGNOSIS — K219 Gastro-esophageal reflux disease without esophagitis: Secondary | ICD-10-CM | POA: Diagnosis present

## 2022-04-14 DIAGNOSIS — M109 Gout, unspecified: Secondary | ICD-10-CM | POA: Diagnosis present

## 2022-04-14 DIAGNOSIS — Z7952 Long term (current) use of systemic steroids: Secondary | ICD-10-CM | POA: Diagnosis not present

## 2022-04-14 DIAGNOSIS — Z888 Allergy status to other drugs, medicaments and biological substances status: Secondary | ICD-10-CM | POA: Diagnosis not present

## 2022-04-14 DIAGNOSIS — E7800 Pure hypercholesterolemia, unspecified: Secondary | ICD-10-CM | POA: Diagnosis present

## 2022-04-14 DIAGNOSIS — D649 Anemia, unspecified: Secondary | ICD-10-CM | POA: Diagnosis present

## 2022-04-14 DIAGNOSIS — K319 Disease of stomach and duodenum, unspecified: Secondary | ICD-10-CM | POA: Diagnosis present

## 2022-04-14 DIAGNOSIS — I2510 Atherosclerotic heart disease of native coronary artery without angina pectoris: Secondary | ICD-10-CM | POA: Diagnosis present

## 2022-04-14 DIAGNOSIS — I10 Essential (primary) hypertension: Secondary | ICD-10-CM

## 2022-04-14 DIAGNOSIS — E559 Vitamin D deficiency, unspecified: Secondary | ICD-10-CM | POA: Diagnosis present

## 2022-04-14 DIAGNOSIS — Z7982 Long term (current) use of aspirin: Secondary | ICD-10-CM | POA: Diagnosis not present

## 2022-04-14 DIAGNOSIS — D509 Iron deficiency anemia, unspecified: Secondary | ICD-10-CM | POA: Diagnosis present

## 2022-04-14 DIAGNOSIS — Z9049 Acquired absence of other specified parts of digestive tract: Secondary | ICD-10-CM | POA: Diagnosis not present

## 2022-04-14 LAB — CBC
HCT: 28 % — ABNORMAL LOW (ref 36.0–46.0)
Hemoglobin: 8 g/dL — ABNORMAL LOW (ref 12.0–15.0)
MCH: 22 pg — ABNORMAL LOW (ref 26.0–34.0)
MCHC: 28.6 g/dL — ABNORMAL LOW (ref 30.0–36.0)
MCV: 76.9 fL — ABNORMAL LOW (ref 80.0–100.0)
Platelets: 320 10*3/uL (ref 150–400)
RBC: 3.64 MIL/uL — ABNORMAL LOW (ref 3.87–5.11)
RDW: 17.4 % — ABNORMAL HIGH (ref 11.5–15.5)
WBC: 9 10*3/uL (ref 4.0–10.5)
nRBC: 0 % (ref 0.0–0.2)

## 2022-04-14 LAB — SURGICAL PCR SCREEN
MRSA, PCR: NEGATIVE
Staphylococcus aureus: POSITIVE — AB

## 2022-04-14 MED ORDER — AMPHETAMINE-DEXTROAMPHETAMINE 10 MG PO TABS
15.0000 mg | ORAL_TABLET | Freq: Once | ORAL | Status: DC
  Filled 2022-04-14: qty 2

## 2022-04-14 MED ORDER — LACTATED RINGERS IV SOLN: INTRAVENOUS | Status: DC

## 2022-04-14 MED ORDER — AMLODIPINE BESYLATE 5 MG PO TABS
10.0000 mg | ORAL_TABLET | Freq: Every day | ORAL | Status: DC
Start: 2022-04-14 — End: 2022-04-14

## 2022-04-14 MED ORDER — ISOSORBIDE MONONITRATE ER 30 MG PO TB24
30.0000 mg | ORAL_TABLET | Freq: Every day | ORAL | Status: DC
  Administered 2022-04-14 – 2022-04-15 (×2): 30 mg via ORAL
  Filled 2022-04-14 (×2): qty 1

## 2022-04-14 MED ORDER — FOLIC ACID 1 MG PO TABS
1.0000 mg | ORAL_TABLET | Freq: Every day | ORAL | Status: DC
  Administered 2022-04-14 – 2022-04-15 (×2): 1 mg via ORAL
  Filled 2022-04-14 (×2): qty 1

## 2022-04-14 MED ORDER — PANTOPRAZOLE SODIUM 40 MG IV SOLR
40.0000 mg | Freq: Every day | INTRAVENOUS | Status: DC
  Administered 2022-04-14: 40 mg via INTRAVENOUS
  Filled 2022-04-14: qty 10

## 2022-04-14 MED ORDER — METOPROLOL SUCCINATE ER 50 MG PO TB24
50.0000 mg | ORAL_TABLET | Freq: Every day | ORAL | Status: DC
  Administered 2022-04-14 – 2022-04-15 (×2): 50 mg via ORAL
  Filled 2022-04-14 (×2): qty 1

## 2022-04-14 MED ORDER — AMPHETAMINE-DEXTROAMPHETAMINE 30 MG PO TABS
15.0000 mg | ORAL_TABLET | Freq: Two times a day (BID) | ORAL | Status: DC
  Filled 2022-04-14 (×2): qty 1

## 2022-04-14 MED ORDER — BUPROPION HCL ER (XL) 150 MG PO TB24
150.0000 mg | ORAL_TABLET | Freq: Two times a day (BID) | ORAL | Status: DC
  Administered 2022-04-14 – 2022-04-15 (×4): 150 mg via ORAL
  Filled 2022-04-14 (×4): qty 1

## 2022-04-14 MED ORDER — AMPHETAMINE-DEXTROAMPHETAMINE 10 MG PO TABS
15.0000 mg | ORAL_TABLET | Freq: Two times a day (BID) | ORAL | Status: DC
  Administered 2022-04-15: 15 mg via ORAL
  Filled 2022-04-14: qty 2

## 2022-04-14 MED ORDER — SERTRALINE HCL 100 MG PO TABS
200.0000 mg | ORAL_TABLET | Freq: Every day | ORAL | Status: DC
  Administered 2022-04-14 – 2022-04-15 (×2): 200 mg via ORAL
  Filled 2022-04-14: qty 2
  Filled 2022-04-14: qty 4

## 2022-04-14 MED ORDER — OXYCODONE HCL 5 MG PO TABS
15.0000 mg | ORAL_TABLET | Freq: Four times a day (QID) | ORAL | Status: DC | PRN
  Administered 2022-04-14 – 2022-04-15 (×4): 15 mg via ORAL
  Filled 2022-04-14 (×4): qty 3

## 2022-04-14 MED ORDER — ATORVASTATIN CALCIUM 10 MG PO TABS
10.0000 mg | ORAL_TABLET | Freq: Every day | ORAL | Status: DC
  Administered 2022-04-14 – 2022-04-15 (×2): 10 mg via ORAL
  Filled 2022-04-14 (×2): qty 1

## 2022-04-14 MED ORDER — AMPHETAMINE-DEXTROAMPHETAMINE 10 MG PO TABS
15.0000 mg | ORAL_TABLET | Freq: Once | ORAL | Status: DC
  Filled 2022-04-14: qty 1

## 2022-04-14 MED ORDER — PANTOPRAZOLE SODIUM 40 MG IV SOLR
40.0000 mg | Freq: Two times a day (BID) | INTRAVENOUS | Status: DC
Start: 2022-04-14 — End: 2022-04-15
  Administered 2022-04-14 – 2022-04-15 (×3): 40 mg via INTRAVENOUS
  Filled 2022-04-14 (×3): qty 10

## 2022-04-14 MED ORDER — ACETAMINOPHEN 325 MG PO TABS: 650.0000 mg | ORAL_TABLET | Freq: Four times a day (QID) | ORAL | Status: DC | PRN

## 2022-04-14 MED ORDER — CHLORPROMAZINE HCL 25 MG/ML IJ SOLN
12.5000 mg | Freq: Three times a day (TID) | INTRAMUSCULAR | Status: DC | PRN
  Administered 2022-04-14: 12.5 mg via INTRAVENOUS
  Filled 2022-04-14: qty 0.5

## 2022-04-14 MED ORDER — MUPIROCIN 2 % EX OINT
1.0000 | TOPICAL_OINTMENT | Freq: Two times a day (BID) | CUTANEOUS | Status: DC
  Administered 2022-04-14 – 2022-04-15 (×2): 1 via NASAL
  Filled 2022-04-14: qty 22

## 2022-04-14 NOTE — H&P (View-Only) (Signed)
Reason for Consult: Anemia Referring Physician: [facility]  Amanda Cardenas HPI: This is a 56 year old female with a PMH of Barrett's esophagus (C10M10), GERD, HTN, CAD, and CAD admitted for a symptomatic anemia.  She reports feeling fatigued for the past year.  Per her report she was not informed about any issues with anemia until this time point.  Her HGB on admission was at 37.3 g/dL and prior blood work on 04/13/2018 was at 12.5 g/dL, which was her baseline.  She states that she experienced a bout of GERD a couple of weeks ago and this was resolved with using Peptobismol, but yesterday she reported having a formed black stool.  She denied any issues with melena or hematochezia.  On a routine basis she takes [employer] for nausea and she only started taking her pantoprazole this past week.  Prior to one week ago her last use of pantoprazole was one year ago.  She stated that she did not know she needed to be on the medication for her Barrett's esophagus.  The last EGD was on 06/05/2019 and it showed a C10M10 Barrett's esophagus.  There was no evidence of any abnormalities to suggest malignancy.  Her last colonoscopy was performed at the same time of the EGD and it was positive for a small polyp (2 mm) that was not retrieved and pandiverticulosis.  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 ([place]) 10/25/2017   Baker cyst    Barrett's esophagus    Colon polyps    Gout    H/O colonoscopy 08/2016   Hx of cardiac cath 10/25/2017   NO INTERVENTION   Hypertension    Hyperthyroidism    Migraines    Vitamin D deficiency     Past Surgical History:  Procedure Laterality Date   ABDOMINAL HYSTERECTOMY     APPENDECTOMY     carpal tunnel repair     ENDOVENOUS ABLATION SAPHENOUS VEIN W/ LASER Left 09/27/2017   endovenous laser  ablation left greater saphenous vein and stab phlebectomy > 20 incisions left leg by Tinnie Gens MD   [place]   LEFT HEART CATH AND CORONARY ANGIOGRAPHY N/A 10/25/2017   Procedure: LEFT HEART CATH AND CORONARY ANGIOGRAPHY;  Surgeon: Adrian Prows, MD;  Location: [facility];  Service: Cardiovascular;  Laterality: N/A;   MEDIASTERNOTOMY N/A 04/11/2018   Procedure: MEDIAN STERNOTOMY;  Surgeon: [name], MD;  Location: [facility] OR;  Service: Thoracic;  Laterality: N/A;   THYMECTOMY N/A 04/11/2018   Procedure: THYMECTOMY;  Surgeon: [name], MD;  Location: [facility] OR;  Service: Thoracic;  Laterality: N/A;   TONSILLECTOMY      Family History  Problem Relation Age of Onset   Hypertension Mother    Hyperlipidemia Mother    Diabetes Mellitus II Mother    Heart disease Mother    Depression Mother    Thyroid disease Cousin     Social History:  reports that she quit smoking about 2 years ago. Her smoking use included e-cigarettes and cigarettes. She has a 6.25 pack-year smoking history. She has never used smokeless tobacco. She reports that she does not drink alcohol and does not use drugs.  Allergies:  Allergies  Allergen Reactions   Zofran [Ondansetron Hcl] Hives   Tramadol Nausea Only    *  ANALGESICS-OPIOIDS*   Prednisone Other (See Comments)    Pt reports hyperactivity, "makes me crazy"    Medications: Scheduled:  amphetamine-dextroamphetamine  15 mg Oral BID WC   amphetamine-dextroamphetamine  15 mg Oral Once   atorvastatin  10 mg Oral Daily   buPROPion  150 mg Oral BID   isosorbide mononitrate  30 mg Oral Daily   metoprolol succinate  50 mg Oral Daily   pantoprazole (PROTONIX) IV  40 mg Intravenous Q12H   sertraline  200 mg Oral Daily   Continuous:  sodium chloride     chlorproMAZINE (THORAZINE) 12.5 mg in sodium chloride 0.9 % 25 mL IVPB     lactated ringers 60 mL/hr at 04/14/22 0820    Results for orders placed or performed during the  hospital encounter of 04/13/22 (from the past 24 hour(s))  CBC     Status: Abnormal   Collection Time: 04/13/22  3:18 PM  Result Value Ref Range   WBC 10.4 4.0 - 10.5 K/uL   RBC 3.58 (L) 3.87 - 5.11 MIL/uL   Hemoglobin 7.3 (L) 12.0 - 15.0 g/dL   HCT 27.2 (L) 36.0 - 46.0 %   MCV 76.0 (L) 80.0 - 100.0 fL   MCH 20.4 (L) 26.0 - 34.0 pg   MCHC 26.8 (L) 30.0 - 36.0 g/dL   RDW 17.2 (H) 11.5 - 15.5 %   Platelets 385 150 - 400 K/uL   nRBC 0.0 0.0 - 0.2 %  Basic metabolic panel     Status: Abnormal   Collection Time: 04/13/22  3:18 PM  Result Value Ref Range   Sodium 141 135 - 145 mmol/L   Potassium 4.3 3.5 - 5.1 mmol/L   Chloride 108 98 - 111 mmol/L   CO2 26 22 - 32 mmol/L   Glucose, Bld 102 (H) 70 - 99 mg/dL   BUN 16 6 - 20 mg/dL   Creatinine, Ser 1.06 (H) 0.44 - 1.00 mg/dL   Calcium 9.1 8.9 - 10.3 mg/dL   GFR, Estimated >60 >60 mL/min   Anion gap 7 5 - 15  Type and screen [place]     Status: None   Collection Time: 04/13/22  3:18 PM  Result Value Ref Range   ABO/RH(D) B POS    Antibody Screen NEG    Sample Expiration 04/16/2022,2359    Unit Number [id number]    Blood Component Type RED CELLS,LR    Unit division 00    Status of Unit ISSUED,FINAL    Transfusion Status OK TO TRANSFUSE    Crossmatch Result      Compatible Performed at [facility], [place] [address], [place], [place] [address]   Ferritin (Iron Binding Protein)     Status: Abnormal   Collection Time: 04/13/22  3:19 PM  Result Value Ref Range   Ferritin 2 (L) 11 - 307 ng/mL  Iron and TIBC     Status: Abnormal   Collection Time: 04/13/22  3:19 PM  Result Value Ref Range   Iron 14 (L) 28 - 170 ug/dL   TIBC 489 (H) 250 - 450 ug/dL   Saturation Ratios 3 (L) 10.4 - 31.8 %   UIBC 475 ug/dL  POC occult blood, ED     Status: None   Collection Time: 04/13/22  5:56 PM  Result Value Ref Range   Fecal Occult Bld NEGATIVE NEGATIVE  Prepare RBC (crossmatch)     Status: None    Collection Time: 04/13/22  6:30 PM  Result Value Ref Range   Order Confirmation      ORDER PROCESSED BY BLOOD BANK Performed at [facility] [address] [place], [place] [address]   CBC with Differential     Status: Abnormal   Collection Time: 04/13/22 11:05 PM  Result Value Ref Range   WBC 11.0 (H) 4.0 - 10.5 K/uL   RBC 3.73 (L) 3.87 - 5.11 MIL/uL   Hemoglobin 8.1 (L) 12.0 - 15.0 g/dL   HCT 28.6 (L) 36.0 - 46.0 %   MCV 76.7 (L) 80.0 - 100.0 fL   MCH 21.7 (L) 26.0 - 34.0 pg   MCHC 28.3 (L) 30.0 - 36.0 g/dL   RDW 17.5 (H) 11.5 - 15.5 %   Platelets 386 150 - 400 K/uL   nRBC 0.0 0.0 - 0.2 %   Neutrophils Relative % 67 %   Neutro Abs 7.4 1.7 - 7.7 K/uL   Lymphocytes Relative 20 %   Lymphs Abs 2.2 0.7 - 4.0 K/uL   Monocytes Relative 9 %   Monocytes Absolute 1.0 0.1 - 1.0 K/uL   Eosinophils Relative 3 %   Eosinophils Absolute 0.3 0.0 - 0.5 K/uL   Basophils Relative 1 %   Basophils Absolute 0.1 0.0 - 0.1 K/uL   Immature Granulocytes 0 %   Abs Immature Granulocytes 0.04 0.00 - 0.07 K/uL  CBC     Status: Abnormal   Collection Time: 04/14/22  6:00 AM  Result Value Ref Range   WBC 9.0 4.0 - 10.5 K/uL   RBC 3.64 (L) 3.87 - 5.11 MIL/uL   Hemoglobin 8.0 (L) 12.0 - 15.0 g/dL   HCT 28.0 (L) 36.0 - 46.0 %   MCV 76.9 (L) 80.0 - 100.0 fL   MCH 22.0 (L) 26.0 - 34.0 pg   MCHC 28.6 (L) 30.0 - 36.0 g/dL   RDW 17.4 (H) 11.5 - 15.5 %   Platelets 320 150 - 400 K/uL   nRBC 0.0 0.0 - 0.2 %     No results found.  ROS:  As stated above in the HPI otherwise negative.  Blood pressure 131/90, pulse 65, temperature 98 F (36.7 C), temperature source Oral, resp. rate 18, height '5\' 2"'$  (1.575 m), weight 84.8 kg, last menstrual period 10/18/2011, SpO2 97 %.    PE: Gen: NAD, Alert and Oriented HEENT:  [place]/AT, EOMI Neck: Supple, no LAD Lungs: CTA Bilaterally CV: RRR without M/G/R ABD: Soft, NTND, +BS Ext: No C/C/E  Assessment/Plan: 1) Anemia. 2) Barrett's esophagus. 3)  GERD.   Her fecal occult was negative.  She only restarted back on her pantoprazole.  An EGD will be performed for further evaluation.  Hopefully a site of bleeding will be identified in the upper GI tract.  Plan: 1) EGD tomorrow.  Chenelle Benning D 04/14/2022, 9:18 AM

## 2022-04-14 NOTE — Assessment & Plan Note (Signed)
Hgb of 7.3 on admit. Given 1 upRBC with improvement to 8.1. -iron panel consistent with iron deficiency anemia-give 1g IV iron infusion -follow repeat CBC in the morning with threshold for transfusion of Hgb <7 -Has seen dark stool recently and has been taking increase dose of alka seltzer due to abdominal bloating and reflux. Need GI consult in the morning for potential endoscopy. Keep NPO.  -Give IV PPI

## 2022-04-14 NOTE — Assessment & Plan Note (Signed)
In the setting of acute anemia -creatinine of 1.09 up from 0.9.  -follow tomorrow after transfusion tonight

## 2022-04-14 NOTE — Assessment & Plan Note (Signed)
Continue statin. 

## 2022-04-14 NOTE — Assessment & Plan Note (Signed)
-   Continue amlodipine ?

## 2022-04-14 NOTE — ED Notes (Signed)
Pt. Refused adderall medication for the 0830 administration, provider notified.

## 2022-04-14 NOTE — Consult Note (Signed)
Reason for Consult: Anemia Referring Physician: [facility]  Margarit Belt HPI: This is a 56 year old female with a PMH of Barrett's esophagus (C10M10), GERD, HTN, CAD, and CAD admitted for a symptomatic anemia.  She reports feeling fatigued for the past year.  Per her report she was not informed about any issues with anemia until this time point.  Her HGB on admission was at 37.3 g/dL and prior blood work on 04/13/2018 was at 12.5 g/dL, which was her baseline.  She states that she experienced a bout of GERD a couple of weeks ago and this was resolved with using Peptobismol, but yesterday she reported having a formed black stool.  She denied any issues with melena or hematochezia.  On a routine basis she takes [employer] for nausea and she only started taking her pantoprazole this past week.  Prior to one week ago her last use of pantoprazole was one year ago.  She stated that she did not know she needed to be on the medication for her Barrett's esophagus.  The last EGD was on 06/05/2019 and it showed a C10M10 Barrett's esophagus.  There was no evidence of any abnormalities to suggest malignancy.  Her last colonoscopy was performed at the same time of the EGD and it was positive for a small polyp (2 mm) that was not retrieved and pandiverticulosis.  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 ([place]) 10/25/2017   Baker cyst    Barrett's esophagus    Colon polyps    Gout    H/O colonoscopy 08/2016   Hx of cardiac cath 10/25/2017   NO INTERVENTION   Hypertension    Hyperthyroidism    Migraines    Vitamin D deficiency     Past Surgical History:  Procedure Laterality Date   ABDOMINAL HYSTERECTOMY     APPENDECTOMY     carpal tunnel repair     ENDOVENOUS ABLATION SAPHENOUS VEIN W/ LASER Left 09/27/2017   endovenous laser  ablation left greater saphenous vein and stab phlebectomy > 20 incisions left leg by Tinnie Gens MD   [place]   LEFT HEART CATH AND CORONARY ANGIOGRAPHY N/A 10/25/2017   Procedure: LEFT HEART CATH AND CORONARY ANGIOGRAPHY;  Surgeon: Adrian Prows, MD;  Location: [facility];  Service: Cardiovascular;  Laterality: N/A;   MEDIASTERNOTOMY N/A 04/11/2018   Procedure: MEDIAN STERNOTOMY;  Surgeon: [name], MD;  Location: [facility] OR;  Service: Thoracic;  Laterality: N/A;   THYMECTOMY N/A 04/11/2018   Procedure: THYMECTOMY;  Surgeon: [name], MD;  Location: [facility] OR;  Service: Thoracic;  Laterality: N/A;   TONSILLECTOMY      Family History  Problem Relation Age of Onset   Hypertension Mother    Hyperlipidemia Mother    Diabetes Mellitus II Mother    Heart disease Mother    Depression Mother    Thyroid disease Cousin     Social History:  reports that she quit smoking about 2 years ago. Her smoking use included e-cigarettes and cigarettes. She has a 6.25 pack-year smoking history. She has never used smokeless tobacco. She reports that she does not drink alcohol and does not use drugs.  Allergies:  Allergies  Allergen Reactions   Zofran [Ondansetron Hcl] Hives   Tramadol Nausea Only    *  ANALGESICS-OPIOIDS*   Prednisone Other (See Comments)    Pt reports hyperactivity, "makes me crazy"    Medications: Scheduled:  amphetamine-dextroamphetamine  15 mg Oral BID WC   amphetamine-dextroamphetamine  15 mg Oral Once   atorvastatin  10 mg Oral Daily   buPROPion  150 mg Oral BID   isosorbide mononitrate  30 mg Oral Daily   metoprolol succinate  50 mg Oral Daily   pantoprazole (PROTONIX) IV  40 mg Intravenous Q12H   sertraline  200 mg Oral Daily   Continuous:  sodium chloride     chlorproMAZINE (THORAZINE) 12.5 mg in sodium chloride 0.9 % 25 mL IVPB     lactated ringers 60 mL/hr at 04/14/22 0820    Results for orders placed or performed during the  hospital encounter of 04/13/22 (from the past 24 hour(s))  CBC     Status: Abnormal   Collection Time: 04/13/22  3:18 PM  Result Value Ref Range   WBC 10.4 4.0 - 10.5 K/uL   RBC 3.58 (L) 3.87 - 5.11 MIL/uL   Hemoglobin 7.3 (L) 12.0 - 15.0 g/dL   HCT 27.2 (L) 36.0 - 46.0 %   MCV 76.0 (L) 80.0 - 100.0 fL   MCH 20.4 (L) 26.0 - 34.0 pg   MCHC 26.8 (L) 30.0 - 36.0 g/dL   RDW 17.2 (H) 11.5 - 15.5 %   Platelets 385 150 - 400 K/uL   nRBC 0.0 0.0 - 0.2 %  Basic metabolic panel     Status: Abnormal   Collection Time: 04/13/22  3:18 PM  Result Value Ref Range   Sodium 141 135 - 145 mmol/L   Potassium 4.3 3.5 - 5.1 mmol/L   Chloride 108 98 - 111 mmol/L   CO2 26 22 - 32 mmol/L   Glucose, Bld 102 (H) 70 - 99 mg/dL   BUN 16 6 - 20 mg/dL   Creatinine, Ser 1.06 (H) 0.44 - 1.00 mg/dL   Calcium 9.1 8.9 - 10.3 mg/dL   GFR, Estimated >60 >60 mL/min   Anion gap 7 5 - 15  Type and screen [place]     Status: None   Collection Time: 04/13/22  3:18 PM  Result Value Ref Range   ABO/RH(D) B POS    Antibody Screen NEG    Sample Expiration 04/16/2022,2359    Unit Number [id number]    Blood Component Type RED CELLS,LR    Unit division 00    Status of Unit ISSUED,FINAL    Transfusion Status OK TO TRANSFUSE    Crossmatch Result      Compatible Performed at [facility], [place] [address] [place], [place] [address]   Ferritin (Iron Binding Protein)     Status: Abnormal   Collection Time: 04/13/22  3:19 PM  Result Value Ref Range   Ferritin 2 (L) 11 - 307 ng/mL  Iron and TIBC     Status: Abnormal   Collection Time: 04/13/22  3:19 PM  Result Value Ref Range   Iron 14 (L) 28 - 170 ug/dL   TIBC 489 (H) 250 - 450 ug/dL   Saturation Ratios 3 (L) 10.4 - 31.8 %   UIBC 475 ug/dL  POC occult blood, ED     Status: None   Collection Time: 04/13/22  5:56 PM  Result Value Ref Range   Fecal Occult Bld NEGATIVE NEGATIVE  Prepare RBC (crossmatch)     Status: None    Collection Time: 04/13/22  6:30 PM  Result Value Ref Range   Order Confirmation      ORDER PROCESSED BY BLOOD BANK Performed at [facility] [address], [place], [place] [address]   CBC with Differential     Status: Abnormal   Collection Time: 04/13/22 11:05 PM  Result Value Ref Range   WBC 11.0 (H) 4.0 - 10.5 K/uL   RBC 3.73 (L) 3.87 - 5.11 MIL/uL   Hemoglobin 8.1 (L) 12.0 - 15.0 g/dL   HCT 28.6 (L) 36.0 - 46.0 %   MCV 76.7 (L) 80.0 - 100.0 fL   MCH 21.7 (L) 26.0 - 34.0 pg   MCHC 28.3 (L) 30.0 - 36.0 g/dL   RDW 17.5 (H) 11.5 - 15.5 %   Platelets 386 150 - 400 K/uL   nRBC 0.0 0.0 - 0.2 %   Neutrophils Relative % 67 %   Neutro Abs 7.4 1.7 - 7.7 K/uL   Lymphocytes Relative 20 %   Lymphs Abs 2.2 0.7 - 4.0 K/uL   Monocytes Relative 9 %   Monocytes Absolute 1.0 0.1 - 1.0 K/uL   Eosinophils Relative 3 %   Eosinophils Absolute 0.3 0.0 - 0.5 K/uL   Basophils Relative 1 %   Basophils Absolute 0.1 0.0 - 0.1 K/uL   Immature Granulocytes 0 %   Abs Immature Granulocytes 0.04 0.00 - 0.07 K/uL  CBC     Status: Abnormal   Collection Time: 04/14/22  6:00 AM  Result Value Ref Range   WBC 9.0 4.0 - 10.5 K/uL   RBC 3.64 (L) 3.87 - 5.11 MIL/uL   Hemoglobin 8.0 (L) 12.0 - 15.0 g/dL   HCT 28.0 (L) 36.0 - 46.0 %   MCV 76.9 (L) 80.0 - 100.0 fL   MCH 22.0 (L) 26.0 - 34.0 pg   MCHC 28.6 (L) 30.0 - 36.0 g/dL   RDW 17.4 (H) 11.5 - 15.5 %   Platelets 320 150 - 400 K/uL   nRBC 0.0 0.0 - 0.2 %     No results found.  ROS:  As stated above in the HPI otherwise negative.  Blood pressure 131/90, pulse 65, temperature 98 F (36.7 C), temperature source Oral, resp. rate 18, height '5\' 2"'$  (1.575 m), weight 84.8 kg, last menstrual period 10/18/2011, SpO2 97 %.    PE: Gen: NAD, Alert and Oriented HEENT:  [place]/AT, EOMI Neck: Supple, no LAD Lungs: CTA Bilaterally CV: RRR without M/G/R ABD: Soft, NTND, +BS Ext: No C/C/E  Assessment/Plan: 1) Anemia. 2) Barrett's esophagus. 3)  GERD.   Her fecal occult was negative.  She only restarted back on her pantoprazole.  An EGD will be performed for further evaluation.  Hopefully a site of bleeding will be identified in the upper GI tract.  Plan: 1) EGD tomorrow.  Jaxon Flatt D 04/14/2022, 9:18 AM

## 2022-04-14 NOTE — Assessment & Plan Note (Signed)
Continue Coreg, Imdur

## 2022-04-14 NOTE — Progress Notes (Signed)
PROGRESS NOTE    Amanda Cardenas  XVQ:[id number] DOB: 18-Oct-1965 DOA: 04/13/2022 PCP: Milford Cage, PA   Brief Narrative: 56 year old with past medical history significant for Barrett's esophagus, GERD, hypertension, CAD, admitted with symptomatic anemia.  She has been feeling tired and fatigued for the last year.  Prior hemoglobin in 2019 was at 12, she presented with a hemoglobin of 7.  She had an episode of black formed stool.  Patient received 1 unit of packed red blood cells, her hemoglobin has increased to 8.  She has severe iron deficiency anemia.  She has received IV iron as well.  GI has been consulted and plan is to proceed with endoscopy tomorrow.  Assessment & Plan:   Principal Problem:   Symptomatic anemia Active Problems:   CAD (coronary artery disease)   Hypercholesterolemia   AKI (acute kidney injury) (HCC)   HTN (hypertension)   1-Symptomatic anemia, iron deficiency anemia: Concern for chronic GI bleed Patient received 1 unit of packed red blood cell.  Her hemoglobin has increased to 8 from 7 on admission.  Prior hemoglobin in 2019 was at 12 He has been consulted and plan is for endoscopy tomorrow. Continue with IV PPI Start folic acid.  Check B 12.  Check LFT   Hypertension: Hold Norvasc, SBP 110.   AKI:  in the setting of acute anemia: Creatinine increased to 1.09 from baseline 0.9  Hypercholesterolemia: Continue with the statins  CAD: Continue with Coreg and indoor  History of barrette esophagus: Continue with PPI  Estimated body mass index is 34.2 kg/m as calculated from the following:   Height as of this encounter: '5\' 2"'$  (1.575 m).   Weight as of this encounter: 84.8 kg.   DVT prophylaxis: SCD Code Status: Full code Family Communication: Care  discussed with  patient.  Disposition Plan:  Status is: Observation The patient remains OBS appropriate and will d/c before 2 midnights.    Consultants:  GI  Procedures:  None  Antimicrobials:     Subjective: She is feeling better. She report episode of black stool after she drink some grapes juice.  She has been feeling tired, fatigue for a year now.  She report some abdominal distension.   Objective: Vitals:   04/14/22 0430 04/14/22 0530 04/14/22 0600 04/14/22 0630  BP: (!) 139/93 134/82 133/77 131/90  Pulse: 61 62 67 65  Resp: '12 12 16 18  '$ Temp:      TempSrc:      SpO2: 97% 93% 99% 97%  Weight:      Height:        Intake/Output Summary (Last 24 hours) at 04/14/2022 0835 Last data filed at 04/13/2022 2104 Gross per 24 hour  Intake 340 ml  Output --  Net 340 ml   Filed Weights   04/13/22 1432  Weight: 84.8 kg    Examination:  General exam: Appears calm and comfortable  Respiratory system: Clear to auscultation. Respiratory effort normal. Cardiovascular system: S1 & S2 heard, RRR.  Gastrointestinal system: Abdomen is nondistended, soft and nontender. No organomegaly or masses felt. Normal bowel sounds heard. Central nervous system: Alert and oriented.  Extremities: Symmetric 5 x 5 power.   Data Reviewed: I have personally reviewed following labs and imaging studies  CBC: Recent Labs  Lab 04/13/22 1518 04/13/22 2305 04/14/22 0600  WBC 10.4 11.0* 9.0  NEUTROABS  --  7.4  --   HGB 7.3* 8.1* 8.0*  HCT 27.2* 28.6* 28.0*  MCV 76.0* 76.7* 76.9*  PLT  385 386 962   Basic Metabolic Panel: Recent Labs  Lab 04/13/22 1518  NA 141  K 4.3  CL 108  CO2 26  GLUCOSE 102*  BUN 16  CREATININE 1.06*  CALCIUM 9.1   GFR: Estimated Creatinine Clearance: 58.4 mL/min (A) (by C-G formula based on SCr of 1.06 mg/dL (H)). Liver Function Tests: No results for input(s): "AST", "ALT", "ALKPHOS", "BILITOT", "PROT", "ALBUMIN" in the last 168 hours. No results for input(s): "LIPASE", "AMYLASE" in the last 168 hours. No results for input(s): "AMMONIA" in the last 168 hours. Coagulation Profile: No results for input(s): "INR", "PROTIME" in the last 168 hours. Cardiac  Enzymes: No results for input(s): "CKTOTAL", "CKMB", "CKMBINDEX", "TROPONINI" in the last 168 hours. BNP (last 3 results) No results for input(s): "PROBNP" in the last 8760 hours. HbA1C: No results for input(s): "HGBA1C" in the last 72 hours. CBG: No results for input(s): "GLUCAP" in the last 168 hours. Lipid Profile: No results for input(s): "CHOL", "HDL", "LDLCALC", "TRIG", "CHOLHDL", "LDLDIRECT" in the last 72 hours. Thyroid Function Tests: No results for input(s): "TSH", "T4TOTAL", "FREET4", "T3FREE", "THYROIDAB" in the last 72 hours. Anemia Panel: Recent Labs    04/13/22 1519  FERRITIN 2*  TIBC 489*  IRON 14*   Sepsis Labs: No results for input(s): "PROCALCITON", "LATICACIDVEN" in the last 168 hours.  No results found for this or any previous visit (from the past 240 hour(s)).       Radiology Studies: No results found.      Scheduled Meds:  amphetamine-dextroamphetamine  15 mg Oral BID WC   amphetamine-dextroamphetamine  15 mg Oral Once   atorvastatin  10 mg Oral Daily   buPROPion  150 mg Oral BID   isosorbide mononitrate  30 mg Oral Daily   metoprolol succinate  50 mg Oral Daily   pantoprazole (PROTONIX) IV  40 mg Intravenous Q12H   sertraline  200 mg Oral Daily   Continuous Infusions:  sodium chloride     lactated ringers 60 mL/hr at 04/14/22 0820     LOS: 0 days    Time spent: 35 minutes    Dale Ribeiro A Annick Dimaio, MD [facility]   If 7PM-7AM, please contact night-coverage [email or web address]  04/14/2022, 8:35 AM

## 2022-04-15 ENCOUNTER — Inpatient Hospital Stay (HOSPITAL_COMMUNITY): Payer: Medicare Other | Admitting: Certified Registered Nurse Anesthetist

## 2022-04-15 ENCOUNTER — Encounter (HOSPITAL_COMMUNITY)
Admission: EM | Disposition: A | Discharge status: Home / Self Care | Payer: Self-pay | Source: Home / Self Care | Attending: Internal Medicine

## 2022-04-15 DIAGNOSIS — K2270 Barrett's esophagus without dysplasia: Secondary | ICD-10-CM

## 2022-04-15 DIAGNOSIS — K3189 Other diseases of stomach and duodenum: Secondary | ICD-10-CM

## 2022-04-15 DIAGNOSIS — D649 Anemia, unspecified: Secondary | ICD-10-CM | POA: Diagnosis not present

## 2022-04-15 DIAGNOSIS — I252 Old myocardial infarction: Secondary | ICD-10-CM

## 2022-04-15 DIAGNOSIS — D509 Iron deficiency anemia, unspecified: Secondary | ICD-10-CM

## 2022-04-15 HISTORY — PX: ESOPHAGOGASTRODUODENOSCOPY (EGD) WITH PROPOFOL: SHX5813

## 2022-04-15 HISTORY — PX: BIOPSY: SHX5522

## 2022-04-15 LAB — PREPARE RBC (CROSSMATCH)

## 2022-04-15 LAB — BASIC METABOLIC PANEL
Anion gap: 7 (ref 5–15)
BUN: 9 mg/dL (ref 6–20)
CO2: 28 mmol/L (ref 22–32)
Calcium: 9.3 mg/dL (ref 8.9–10.3)
Chloride: 109 mmol/L (ref 98–111)
Creatinine, Ser: 0.95 mg/dL (ref 0.44–1.00)
GFR, Estimated: >60 mL/min (ref >60)
Glucose, Bld: 82 mg/dL (ref 70–99)
Potassium: 4.2 mmol/L (ref 3.5–5.1)
Sodium: 144 mmol/L (ref 135–145)

## 2022-04-15 LAB — CBC
HCT: 28.5 % — ABNORMAL LOW (ref 36.0–46.0)
Hemoglobin: 8.1 g/dL — ABNORMAL LOW (ref 12.0–15.0)
MCH: 22.1 pg — ABNORMAL LOW (ref 26.0–34.0)
MCHC: 28.4 g/dL — ABNORMAL LOW (ref 30.0–36.0)
MCV: 77.7 fL — ABNORMAL LOW (ref 80.0–100.0)
Platelets: 344 10*3/uL (ref 150–400)
RBC: 3.67 MIL/uL — ABNORMAL LOW (ref 3.87–5.11)
RDW: 17.8 % — ABNORMAL HIGH (ref 11.5–15.5)
WBC: 7.9 10*3/uL (ref 4.0–10.5)
nRBC: 0 % (ref 0.0–0.2)

## 2022-04-15 LAB — HEPATIC FUNCTION PANEL
ALT: 15 U/L (ref 0–44)
AST: 12 U/L — ABNORMAL LOW (ref 15–41)
Albumin: 3.5 g/dL (ref 3.5–5.0)
Alkaline Phosphatase: 59 U/L (ref 38–126)
Bilirubin, Direct: <0.1 mg/dL (ref 0.0–0.2)
Total Bilirubin: 0.7 mg/dL (ref 0.3–1.2)
Total Protein: 6.2 g/dL — ABNORMAL LOW (ref 6.5–8.1)

## 2022-04-15 LAB — VITAMIN B12: Vitamin B-12: 248 pg/mL (ref 180–914)

## 2022-04-15 SURGERY — ESOPHAGOGASTRODUODENOSCOPY (EGD) WITH PROPOFOL
Anesthesia: Monitor Anesthesia Care

## 2022-04-15 MED ORDER — SODIUM CHLORIDE 0.9% IV SOLUTION
Freq: Once | INTRAVENOUS | Status: AC
Start: 2022-04-15 — End: 2022-04-15

## 2022-04-15 MED ORDER — CHLORHEXIDINE GLUCONATE CLOTH 2 % EX PADS: 6.0000 | MEDICATED_PAD | Freq: Every day | CUTANEOUS | Status: DC

## 2022-04-15 MED ORDER — PROPOFOL 10 MG/ML IV BOLUS
INTRAVENOUS | Status: DC | PRN
  Administered 2022-04-15: 20 mg via INTRAVENOUS
  Administered 2022-04-15 (×2): 30 mg via INTRAVENOUS

## 2022-04-15 MED ORDER — FERROUS SULFATE 325 (65 FE) MG PO TABS: 325.0000 mg | ORAL_TABLET | Freq: Every day | ORAL | 3 refills | Status: DC

## 2022-04-15 MED ORDER — PROPOFOL 500 MG/50ML IV EMUL
INTRAVENOUS | Status: DC | PRN
  Administered 2022-04-15: 150 ug/kg/min via INTRAVENOUS

## 2022-04-15 MED ORDER — CYANOCOBALAMIN 1000 MCG PO TABS: 1000.0000 ug | ORAL_TABLET | Freq: Every day | ORAL | 0 refills | Status: DC

## 2022-04-15 MED ORDER — FOLIC ACID 1 MG PO TABS: 1.0000 mg | ORAL_TABLET | Freq: Every day | ORAL | 1 refills | Status: DC

## 2022-04-15 MED ORDER — PANTOPRAZOLE SODIUM 40 MG PO TBEC: 40.0000 mg | DELAYED_RELEASE_TABLET | Freq: Every day | ORAL | 2 refills | Status: AC

## 2022-04-15 MED ORDER — PROPOFOL 500 MG/50ML IV EMUL
INTRAVENOUS | Status: AC
  Filled 2022-04-15: qty 50

## 2022-04-15 MED ORDER — SODIUM CHLORIDE 0.9 % IV SOLN: INTRAVENOUS | Status: DC

## 2022-04-15 MED ORDER — VITAMIN B-12 1000 MCG PO TABS
1000.0000 ug | ORAL_TABLET | Freq: Every day | ORAL | Status: DC
  Administered 2022-04-15: 1000 ug via ORAL
  Filled 2022-04-15: qty 1

## 2022-04-15 SURGICAL SUPPLY — 15 items

## 2022-04-15 NOTE — Anesthesia Procedure Notes (Signed)
Procedure Name: MAC Date/Time: 04/15/2022 8:52 AM  Performed by: Claudia Desanctis, CRNAPre-anesthesia Checklist: Patient identified, Emergency Drugs available, Suction available and Patient being monitored Patient Re-evaluated:Patient Re-evaluated prior to induction Oxygen Delivery Method: Simple face mask

## 2022-04-15 NOTE — Op Note (Signed)
[facility] Patient Name: Amanda Cardenas Procedure Date: 04/15/2022 MRN: [id number] Attending MD: Carol Ada , MD Date of Birth: 11/21/1965 CSN: [id number] Age: 56 Admit Type: Inpatient Procedure:                Upper GI endoscopy Indications:              Iron deficiency anemia Providers:                Carol Ada, MD, Benay Pillow, RN, William Dalton, Technician, Darliss Cheney, Technician Referring MD:              Medicines:                Propofol per Anesthesia Complications:            No immediate complications. Estimated Blood Loss:     Estimated blood loss: none. Procedure:                Pre-Anesthesia Assessment:                           - Prior to the procedure, a History and Physical                            was performed, and patient medications and                            allergies were reviewed. The patient's tolerance of                            previous anesthesia was also reviewed. The risks                            and benefits of the procedure and the sedation                            options and risks were discussed with the patient.                            All questions were answered, and informed consent                            was obtained. Prior Anticoagulants: The patient has                            taken no previous anticoagulant or antiplatelet                            agents. ASA Grade Assessment: II - A patient with                            mild systemic disease. After reviewing the risks  and benefits, the patient was deemed in                            satisfactory condition to undergo the procedure.                           - Sedation was administered by an anesthesia                            professional. Deep sedation was attained.                           After obtaining informed consent, the endoscope was                            passed under  direct vision. Throughout the                            procedure, the patient's blood pressure, pulse, and                            oxygen saturations were monitored continuously. The                            [id number] ([id number] Olympus endoscope was introduced                            through the mouth, and advanced to the second part                            of duodenum. The upper GI endoscopy was                            accomplished without difficulty. The patient                            tolerated the procedure well. Scope In: Scope Out: Findings:      There were esophageal mucosal changes classified as Barrett's stage       C10-M10 per [place] criteria present in the upper third of the esophagus,       in the middle third of the esophagus and in the lower third of the       esophagus. The maximum longitudinal extent of these mucosal changes was       10 cm in length.      A few dispersed small erosions with no stigmata of recent bleeding were       found in the gastric body. Biopsies were taken with a cold forceps for       Helicobacter pylori testing.      The examined duodenum was normal.      There were no gross changes to the Barrett's esophagus, i.e., no       suspicious lesions. The proximal extent was noted to be at 23 cm. In the       proximal gastric body multiple linear erythematous lesions with       superficial  nonbleeding erosions were found. Gastric biopsies were       obtained. This is presumed to be the source of her anemia. Impression:               - Esophageal mucosal changes classified as                            Barrett's stage C10-M10 per [place] criteria.                           - Erosive gastropathy with no stigmata of recent                            bleeding. Biopsied.                           - Normal examined duodenum. Moderate Sedation:      Not Applicable - Patient had care per Anesthesia. Recommendation:           - Return patient  to hospital ward for ongoing care.                           - Resume regular diet.                           - Continue present medications.                           - PPI QD.                           - Okay to D/C home.                           - Follow up in the office in 2 weeks.                           - Signing off. Procedure Code(s):        --- Professional ---                           [phone], Esophagogastroduodenoscopy, flexible,                            transoral; with biopsy, single or multiple Diagnosis Code(s):        --- Professional ---                           K22.70, Barrett's esophagus without dysplasia                           K31.89, Other diseases of stomach and duodenum                           D50.9, Iron deficiency anemia, unspecified CPT copyright 2019 [employer] Medical Association. All rights reserved. The codes documented in this report are preliminary and upon coder review may  be revised to meet current  compliance requirements. Carol Ada, MD Carol Ada, MD 04/15/2022 9:09:18 AM This report has been signed electronically. Number of Addenda: 0

## 2022-04-15 NOTE — TOC CM/SW Note (Signed)
  Transition of Care (TOC) Screening Note   Patient Details  Name: Amanda Cardenas Date of Birth: 1966/08/25   Transition of Care [facility]) CM/SW Contact:    Ross Ludwig, LCSW Phone Number: 04/15/2022, 1:58 PM    Transition of Care Department [facility]) has reviewed patient and no TOC needs have been identified at this time. We will continue to monitor patient advancement through interdisciplinary progression rounds. If new patient transition needs arise, please place a TOC consult.

## 2022-04-15 NOTE — Discharge Summary (Signed)
Physician Discharge Summary   Patient: Amanda Cardenas MRN: [id number] DOB: [birth year]  Admit date:     04/13/2022  Discharge date: 04/15/22  Discharge Physician: Elmarie Shiley   PCP: Milford Cage, PA   Recommendations at discharge:    Follow up with GI for further care Barrett esophagus.   Discharge Diagnoses: Principal Problem:   Symptomatic anemia Active Problems:   Erosive gastropathy   CAD (coronary artery disease)   Barrett esophagus   Hypercholesterolemia   AKI (acute kidney injury) ([place])   HTN (hypertension)  Resolved Problems:   * No resolved hospital problems. *  Hospital Course: 56 year old with past medical history significant for Barrett's esophagus, GERD, hypertension, CAD, admitted with symptomatic anemia.  She has been feeling tired and fatigued for the last year.  Prior hemoglobin in 2019 was at 12, she presented with a hemoglobin of 7.  She had an episode of black formed stool.   Patient received 1 unit of packed red blood cells, her hemoglobin has increased to 8.  She has severe iron deficiency anemia.  She has received IV iron as well.  GI has been consulted and plan is to proceed with endoscopy tomorrow  Assessment and Plan: 1-Symptomatic anemia, iron deficiency anemia: Erosive gastropathy, barrettes esophagus Concern for chronic GI bleed Patient received 1 unit of packed red blood cell.  Her hemoglobin has increased to 8 from 7 on admission.  Prior hemoglobin in 2019 was at 12 GI consulted. Underwent endoscopy which showed: Esophageal mucosal changes classified as Barret stage C10 M10, erosive gastropathy with no stigmata of recent bleeding.  Status post biopsy.  This is presume reason for anemia.  Continue with IV PPI Start folic acid.  B 12. Low normal. Started supplement.  She is still SOB on ambulation and symptomatic. Plan to transfuse one unit PRBC prior to discharge today.     Hypertension: Hold Norvasc, SBP 110.    AKI:  in the setting of  acute anemia: Creatinine increased to 1.09 from baseline 0.9   Hypercholesterolemia: Continue with the statins   CAD: Continue with Coreg and Imdur.    History of barrette esophagus: Continue with PPI, patient advised she will need follow up with her gastroenterologist.    Estimated body mass index is 34.2 kg/m as calculated from the following:   Height as of this encounter: '5\' 2"'$  (1.575 m).   Weight as of this encounter: 84.8 kg.         Consultants: GI Procedures performed: Endoscopy  Disposition: Home Diet recommendation:  Discharge Diet Orders (From admission, onward)     Start     Ordered   04/15/22 0000  Diet - low sodium heart healthy        04/15/22 1127           Cardiac diet DISCHARGE MEDICATION: Allergies as of 04/15/2022       Reactions   Zofran [ondansetron Hcl] Hives   Tramadol Nausea Only   *ANALGESICS-OPIOIDS*   Prednisone Other (See Comments)   Pt reports hyperactivity, "makes me crazy"        Medication List     STOP taking these medications    amLODipine 10 MG tablet Commonly known as: NORVASC   aspirin EC 325 MG tablet   losartan 50 MG tablet Commonly known as: COZAAR       TAKE these medications    albuterol 108 (90 Base) MCG/ACT inhaler Commonly known as: VENTOLIN HFA Inhale 2 puffs into the lungs every  6 (six) hours as needed for wheezing or shortness of breath.   amphetamine-dextroamphetamine 30 MG tablet Commonly known as: ADDERALL Take 15 mg by mouth 2 (two) times daily with breakfast and lunch. IN THE MORNING & AT NOON.   atorvastatin 10 MG tablet Commonly known as: Lipitor Take 1 tablet (10 mg total) by mouth daily.   buPROPion 150 MG 24 hr tablet Commonly known as: WELLBUTRIN XL Take 150 mg by mouth 2 (two) times daily.   cyanocobalamin 1000 MCG tablet Take 1 tablet (1,000 mcg total) by mouth daily. Start taking on: April 16, 2022   ferrous sulfate 325 (65 FE) MG tablet Take 1 tablet (325 mg total) by  mouth daily.   fluticasone 50 MCG/ACT nasal spray Commonly known as: FLONASE Place 1 spray into both nostrils daily.   folic acid 1 MG tablet Commonly known as: FOLVITE Take 1 tablet (1 mg total) by mouth daily. Start taking on: April 16, 2022   furosemide 20 MG tablet Commonly known as: LASIX Take 20 mg by mouth daily as needed for fluid.   isosorbide mononitrate 30 MG 24 hr tablet Commonly known as: IMDUR Take 30 mg by mouth daily.   metoprolol succinate 50 MG 24 hr tablet Commonly known as: TOPROL-XL Take 50 mg by mouth daily.   omeprazole 40 MG capsule Commonly known as: PRILOSEC Take 40 mg by mouth as needed.   oxyCODONE 15 MG immediate release tablet Commonly known as: ROXICODONE Take 15 mg by mouth 4 (four) times daily as needed for pain.   pantoprazole 40 MG tablet Commonly known as: Protonix Take 1 tablet (40 mg total) by mouth daily.   predniSONE 5 MG tablet Commonly known as: DELTASONE Take 5 mg by mouth daily.   pregabalin 200 MG capsule Commonly known as: LYRICA Take 200 mg by mouth daily as needed (For pain).   sertraline 100 MG tablet Commonly known as: ZOLOFT Take 200 mg by mouth daily.        Discharge Exam: Filed Weights   04/13/22 1432  Weight: 84.8 kg   General; NAD Lung; CTA  Condition at discharge: stable  The results of significant diagnostics from this hospitalization (including imaging, microbiology, ancillary and laboratory) are listed below for reference.   Imaging Studies: No results found.  Microbiology: Results for orders placed or performed during the hospital encounter of 04/13/22  Surgical PCR screen     Status: Abnormal   Collection Time: 04/14/22  6:14 PM   Specimen: Nasal Mucosa; Nasal Swab  Result Value Ref Range Status   MRSA, PCR NEGATIVE NEGATIVE Final   Staphylococcus aureus POSITIVE (A) NEGATIVE Final    Comment: (NOTE) The Xpert SA Assay (FDA approved for NASAL specimens in patients 8 years of age  and older), is one component of a comprehensive surveillance program. It is not intended to diagnose infection nor to guide or monitor treatment. Performed at [facility], [place] [address] [place], [place] [address]     Labs: CBC: Recent Labs  Lab 04/13/22 1518 04/13/22 2305 04/14/22 0600 04/15/22 0349  WBC 10.4 11.0* 9.0 7.9  NEUTROABS  --  7.4  --   --   HGB 7.3* 8.1* 8.0* 8.1*  HCT 27.2* 28.6* 28.0* 28.5*  MCV 76.0* 76.7* 76.9* 77.7*  PLT 385 386 320 616   Basic Metabolic Panel: Recent Labs  Lab 04/13/22 1518 04/15/22 0349  NA 141 144  K 4.3 4.2  CL 108 109  CO2 26 28  GLUCOSE 102* 82  BUN 16 9  CREATININE 1.06* 0.95  CALCIUM 9.1 9.3   Liver Function Tests: Recent Labs  Lab 04/15/22 0349  AST 12*  ALT 15  ALKPHOS 59  BILITOT 0.7  PROT 6.2*  ALBUMIN 3.5   CBG: No results for input(s): "GLUCAP" in the last 168 hours.  Discharge time spent: greater than 30 minutes.  Signed: Elmarie Shiley, MD [facility] 04/15/2022

## 2022-04-15 NOTE — Transfer of Care (Signed)
Immediate Anesthesia Transfer of Care Note  Patient: Amanda Cardenas  Procedure(s) Performed: ESOPHAGOGASTRODUODENOSCOPY (EGD) WITH PROPOFOL BIOPSY  Patient Location: PACU  Anesthesia Type:MAC  Level of Consciousness: awake, alert , oriented and patient cooperative  Airway & Oxygen Therapy: Patient Spontanous Breathing and Patient connected to face mask  Post-op Assessment: Report given to RN and Post -op Vital signs reviewed and stable  Post vital signs: Reviewed and stable  Last Vitals:  Vitals Value Taken Time  BP    Temp    Pulse    Resp 13 04/15/22 0907  SpO2    Vitals shown include unvalidated device data.  Last Pain:  Vitals:   04/15/22 0745  TempSrc: Oral  PainSc: 6          Complications: No notable events documented.

## 2022-04-15 NOTE — Anesthesia Postprocedure Evaluation (Signed)
Anesthesia Post Note  Patient: Amanda Cardenas  Procedure(s) Performed: ESOPHAGOGASTRODUODENOSCOPY (EGD) WITH PROPOFOL BIOPSY     Patient location during evaluation: PACU Anesthesia Type: MAC Level of consciousness: awake and alert Pain management: pain level controlled Vital Signs Assessment: post-procedure vital signs reviewed and stable Respiratory status: spontaneous breathing, nonlabored ventilation, respiratory function stable and patient connected to nasal cannula oxygen Cardiovascular status: stable and blood pressure returned to baseline Postop Assessment: no apparent nausea or vomiting Anesthetic complications: no   No notable events documented.  Last Vitals:  Vitals:   04/15/22 0910 04/15/22 0920  BP: 111/67   Pulse: 67 68  Resp: 12 16  Temp:    SpO2: 99% 99%    Last Pain:  Vitals:   04/15/22 0920  TempSrc:   PainSc: 0-No pain                 Gifford Ballon S

## 2022-04-15 NOTE — Anesthesia Preprocedure Evaluation (Signed)
Anesthesia Evaluation  Patient identified by MRN, date of birth, ID band Patient awake    Reviewed: Allergy & Precautions, NPO status , Patient's Chart, lab work & pertinent test results  Airway Mallampati: II  TM Distance: >3 FB Neck ROM: Full    Dental no notable dental hx.    Pulmonary neg pulmonary ROS, Patient abstained from smoking., former smoker,    Pulmonary exam normal breath sounds clear to auscultation       Cardiovascular hypertension, + Past MI  Normal cardiovascular exam Rhythm:Regular Rate:Normal     Neuro/Psych negative neurological ROS  negative psych ROS   GI/Hepatic Neg liver ROS, GERD  ,  Endo/Other  Hyperthyroidism   Renal/GU negative Renal ROS  negative genitourinary   Musculoskeletal negative musculoskeletal ROS (+)   Abdominal   Peds negative pediatric ROS (+)  Hematology  (+) Blood dyscrasia, anemia ,   Anesthesia Other Findings   Reproductive/Obstetrics negative OB ROS                             Anesthesia Physical Anesthesia Plan  ASA: 3  Anesthesia Plan: MAC   Post-op Pain Management: Minimal or no pain anticipated   Induction: Intravenous  PONV Risk Score and Plan: 2 and Propofol infusion and Treatment may vary due to age or medical condition  Airway Management Planned: Simple Face Mask  Additional Equipment:   Intra-op Plan:   Post-operative Plan:   Informed Consent: I have reviewed the patients History and Physical, chart, labs and discussed the procedure including the risks, benefits and alternatives for the proposed anesthesia with the patient or authorized representative who has indicated his/her understanding and acceptance.     Dental advisory given  Plan Discussed with: CRNA and Surgeon  Anesthesia Plan Comments:         Anesthesia Quick Evaluation

## 2022-04-15 NOTE — Interval H&P Note (Signed)
History and Physical Interval Note:  04/15/2022 8:39 AM  Amanda Cardenas  has presented today for surgery, with the diagnosis of Symptomatic anemia.  The various methods of treatment have been discussed with the patient and family. After consideration of risks, benefits and other options for treatment, the patient has consented to  Procedure(s): ESOPHAGOGASTRODUODENOSCOPY (EGD) WITH PROPOFOL (N/A) as a surgical intervention.  The patient's history has been reviewed, patient examined, no change in status, stable for surgery.  I have reviewed the patient's chart and labs.  Questions were answered to the patient's satisfaction.     Adryan Shin D

## 2022-04-16 ENCOUNTER — Encounter (HOSPITAL_COMMUNITY): Payer: Self-pay | Admitting: Gastroenterology

## 2022-04-17 LAB — BPAM RBC
Blood Product Expiration Date: 202307262359
Blood Product Expiration Date: 202307302359
ISSUE DATE / TIME: 202307131843
ISSUE DATE / TIME: 202307151431
Unit Type and Rh: 7300
Unit Type and Rh: 7300

## 2022-04-17 LAB — TYPE AND SCREEN
ABO/RH(D): B POS
Antibody Screen: NEGATIVE
Unit division: 0
Unit division: 0

## 2022-04-18 LAB — SURGICAL PATHOLOGY

## 2022-09-08 ENCOUNTER — Ambulatory Visit: Payer: Medicare Other | Admitting: Student

## 2022-09-08 ENCOUNTER — Encounter: Payer: Self-pay | Admitting: Internal Medicine

## 2022-09-08 ENCOUNTER — Ambulatory Visit: Payer: Medicare Other | Admitting: Internal Medicine

## 2022-09-08 VITALS — BP 117/83 | HR 72 | Ht 62.0 in | Wt 185.6 lb

## 2022-09-08 DIAGNOSIS — I214 Non-ST elevation (NSTEMI) myocardial infarction: Secondary | ICD-10-CM

## 2022-09-08 DIAGNOSIS — E782 Mixed hyperlipidemia: Secondary | ICD-10-CM

## 2022-09-08 DIAGNOSIS — I10 Essential (primary) hypertension: Secondary | ICD-10-CM

## 2022-09-08 NOTE — Progress Notes (Signed)
Primary Physician/Referring:  Paschal Dopp, PA  Patient ID: Amanda Cardenas, female    DOB: [birth year], 56 y.o.   MRN: [id number]  Chief Complaint  Patient presents with   Hypertension   NSTEMI   Medication Management   HPI:      HPI: Amanda Cardenas  is a 56 y.o. 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  Patient follows with pain management for chronic back, neck, and leg pain.  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  Patient presents for follow-up visit. She has been doing well since the last time she was here. Patient does admit to feeling dizzy since some of her BP meds were changed and is wondering if we can remove on pill. Denies chest pain, shortness of breath, palpitations, diaphoresis, syncope, edema, PND, orthopnea.   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 (HCC) 10/25/2017   Baker cyst    Barrett's esophagus    Colon polyps    Gout    H/O colonoscopy 08/2016   Hx of cardiac cath 10/25/2017   NO INTERVENTION   Hypertension    Hyperthyroidism    Migraines    Vitamin D deficiency    Past Surgical History:  Procedure Laterality Date   ABDOMINAL HYSTERECTOMY     APPENDECTOMY     BIOPSY  04/15/2022   Procedure: BIOPSY;  Surgeon: Jeani Hawking, MD;  Location: [facility];  Service: Gastroenterology;;   carpal tunnel repair     ENDOVENOUS ABLATION  SAPHENOUS VEIN W/ LASER Left 09/27/2017   endovenous laser ablation left greater saphenous vein and stab phlebectomy > 20 incisions left leg by Josephina Gip MD   ESOPHAGOGASTRODUODENOSCOPY (EGD) WITH PROPOFOL N/A 04/15/2022   Procedure: ESOPHAGOGASTRODUODENOSCOPY (EGD) WITH PROPOFOL;  Surgeon: Jeani Hawking, MD;  Location: [facility];  Service: Gastroenterology;  Laterality: N/A;   HIATAL HERNIA REPAIR  2000   LEFT HEART CATH AND CORONARY ANGIOGRAPHY N/A 10/25/2017   Procedure: LEFT HEART CATH AND CORONARY ANGIOGRAPHY;  Surgeon: Yates Decamp, MD;  Location: [facility];  Service: Cardiovascular;  Laterality: N/A;   MEDIASTERNOTOMY N/A 04/11/2018   Procedure: MEDIAN STERNOTOMY;  Surgeon: Loreli Slot, MD;  Location: [facility] OR;  Service: Thoracic;  Laterality: N/A;   THYMECTOMY N/A 04/11/2018   Procedure: THYMECTOMY;  Surgeon: Loreli Slot, MD;  Location: [facility] OR;  Service: Thoracic;  Laterality: N/A;   TONSILLECTOMY     Social History   Tobacco Use   Smoking status: Former    Packs/day: 0.25    Years: 25.00    Total pack years: 6.25    Types: E-cigarettes, Cigarettes    Quit date: 05/2019    Years since quitting: 3.3   Smokeless tobacco: Never   Tobacco comments:    trying to quit, 3 days/week patient uses vape about 3 times a day when craving nicotine   Substance Use Topics   Alcohol use: No   Marital status: Single  ROS  Review of Systems  Cardiovascular:  Negative for chest pain, claudication, dyspnea on exertion, leg swelling, near-syncope, orthopnea, palpitations, paroxysmal nocturnal dyspnea and syncope.  Neurological:  Positive for dizziness.   Objective  Blood pressure 117/83, pulse 72, height 5\' 2"  (1.575 m), weight 185 lb 9.6 oz (84.2 kg), last menstrual period 10/18/2011, SpO2 96 %. Body mass index is 33.95 kg/m.   Physical Exam Vitals reviewed.  Constitutional:      Appearance: She is well-developed.  Cardiovascular:     Rate and Rhythm: Normal  rate and regular rhythm.     Pulses: Intact distal pulses.          Carotid pulses are  on the left side with bruit.    Heart sounds: Normal heart sounds.  Pulmonary:     Effort: Pulmonary effort is normal. No accessory muscle usage or respiratory distress.     Breath sounds: Normal breath sounds.  Musculoskeletal:     Right lower leg: No edema.     Left lower leg: No edema.  Neurological:     Mental Status: She is alert.   Physical exam unchanged compared to previous office visit.  Laboratory examination:   Recent Labs    02/22/22 1338 04/13/22 1518 04/15/22 0349  NA 141 141 144  K 4.9 4.3 4.2  CL 103 108 109  CO2 23 26 28   GLUCOSE 96 102* 82  BUN 15 16 9   CREATININE 0.92 1.06* 0.95  CALCIUM 9.2 9.1 9.3  GFRNONAA  --  >60 >60      Latest Ref Rng & Units 04/15/2022    3:49 AM 04/13/2022    3:18 PM 02/22/2022    1:38 PM  CMP  Glucose 70 - 99 mg/dL 82  425  96   BUN 6 - 20 mg/dL 9  16  15    Creatinine 0.44 - 1.00 mg/dL 9.56  3.87  5.64   Sodium 135 - 145 mmol/L 144  141  141   Potassium 3.5 - 5.1 mmol/L 4.2  4.3  4.9   Chloride 98 - 111 mmol/L 109  108  103   CO2 22 - 32 mmol/L 28  26  23    Calcium 8.9 - 10.3 mg/dL 9.3  9.1  9.2   Total Protein 6.5 - 8.1 g/dL 6.2     Total Bilirubin 0.3 - 1.2 mg/dL 0.7     Alkaline Phos 38 - 126 U/L 59     AST 15 - 41 U/L 12     ALT 0 - 44 U/L 15         Latest Ref Rng & Units 04/15/2022    3:49 AM 04/14/2022    6:00 AM 04/13/2022   11:05 PM  CBC  WBC 4.0 - 10.5 K/uL 7.9  9.0  11.0   Hemoglobin 12.0 - 15.0 g/dL 8.1  8.0  8.1   Hematocrit 36.0 - 46.0 % 28.5  28.0  28.6   Platelets 150 - 400 K/uL 344  320  386    Lipid Panel     Component Value Date/Time   CHOL 152 02/22/2022 1338   TRIG 131 02/22/2022 1338   HDL 47 02/22/2022 1338   CHOLHDL 3.5 10/25/2017 0617   VLDL 43 (H) 10/25/2017 0617   LDLCALC 82 02/22/2022 1338   HEMOGLOBIN A1C No results found for: "HGBA1C", "MPG" TSH No results for input(s): "TSH" in the  last 8760 hours.  External Labs:  11/17/2021: BUN 14, creatinine 1.12, GFR 57, sodium 141, potassium 4.7  Allergies  Allergies  Allergen Reactions   Zofran [Ondansetron Hcl] Hives   Tramadol Nausea Only    *ANALGESICS-OPIOIDS*   Prednisone Other (See Comments)    Pt reports hyperactivity, "makes me crazy"    Medications Prior to Visit:   Outpatient Medications Prior to Visit  Medication Sig Dispense Refill   albuterol (PROVENTIL HFA;VENTOLIN HFA) 108 (90 Base) MCG/ACT inhaler Inhale 2 puffs into the lungs every 6 (six) hours as needed for wheezing or shortness of breath.      amphetamine-dextroamphetamine (ADDERALL) 30 MG tablet Take 15 mg by mouth 2 (two) times daily with breakfast and lunch. IN THE MORNING & AT NOON.     atorvastatin (LIPITOR) 10 MG tablet Take 1 tablet (10 mg total) by mouth daily. 90 tablet 3   buPROPion (WELLBUTRIN XL) 150 MG 24 hr tablet Take 150 mg by mouth 2 (two) times daily.      ferrous sulfate 325 (65 FE) MG tablet Take 1 tablet (325 mg total) by mouth daily. 30 tablet 3   fluticasone (FLONASE) 50 MCG/ACT nasal spray Place 1 spray into both nostrils daily.     folic acid (FOLVITE) 1 MG tablet Take 1 tablet (1 mg total) by mouth daily. 30 tablet 1   furosemide (LASIX) 20 MG tablet Take 20 mg by mouth daily as needed for fluid.     isosorbide mononitrate (IMDUR) 30 MG 24 hr tablet Take 30 mg by mouth daily.  3   losartan (COZAAR) 50 MG tablet Take 50 mg by mouth daily.     metoprolol succinate (TOPROL-XL) 50 MG 24 hr tablet Take 50 mg by mouth daily.     oxyCODONE (ROXICODONE) 15 MG immediate release tablet Take 15 mg by mouth 4 (four) times daily as needed for pain.     pantoprazole (PROTONIX) 40 MG tablet Take 1 tablet (40 mg total) by mouth daily. 30 tablet 2   pregabalin (LYRICA) 200 MG capsule Take 200 mg by mouth daily as needed (For pain).     sertraline (ZOLOFT) 100 MG tablet Take 200 mg by mouth daily.     vitamin B-12 1000 MCG tablet Take 1  tablet (1,000 mcg total) by mouth daily. 30 tablet 0   amLODipine (NORVASC) 10 MG tablet Take 10 mg by mouth daily.     omeprazole (PRILOSEC) 40 MG capsule Take 40 mg by mouth as needed.     predniSONE (DELTASONE) 5 MG tablet Take 5 mg by mouth daily.     No facility-administered medications prior to visit.   Final Medications at End of Visit    Current Meds  Medication Sig   albuterol (PROVENTIL HFA;VENTOLIN HFA) 108 (90 Base) MCG/ACT inhaler Inhale 2 puffs into the lungs every 6 (six) hours as needed for wheezing or shortness of breath.    amphetamine-dextroamphetamine (ADDERALL) 30 MG tablet Take 15 mg by mouth 2 (two) times daily with breakfast and lunch. IN THE MORNING & AT NOON.   atorvastatin (LIPITOR) 10 MG tablet Take 1 tablet (10 mg total) by mouth daily.   buPROPion (WELLBUTRIN XL) 150 MG 24 hr tablet Take 150 mg by mouth 2 (two) times daily.    ferrous sulfate 325 (65 FE) MG tablet Take 1 tablet (325 mg total) by mouth daily.   fluticasone (FLONASE) 50 MCG/ACT nasal spray Place 1 spray into both nostrils daily.   folic acid (FOLVITE) 1 MG tablet Take 1 tablet (1 mg total) by mouth daily.   furosemide (LASIX) 20 MG tablet Take 20  mg by mouth daily as needed for fluid.   isosorbide mononitrate (IMDUR) 30 MG 24 hr tablet Take 30 mg by mouth daily.   losartan (COZAAR) 50 MG tablet Take 50 mg by mouth daily.   metoprolol succinate (TOPROL-XL) 50 MG 24 hr tablet Take 50 mg by mouth daily.   oxyCODONE (ROXICODONE) 15 MG immediate release tablet Take 15 mg by mouth 4 (four) times daily as needed for pain.   pantoprazole (PROTONIX) 40 MG tablet Take 1 tablet (40 mg total) by mouth daily.   pregabalin (LYRICA) 200 MG capsule Take 200 mg by mouth daily as needed (For pain).   sertraline (ZOLOFT) 100 MG tablet Take 200 mg by mouth daily.   vitamin B-12 1000 MCG tablet Take 1 tablet (1,000 mcg total) by mouth daily.   [DISCONTINUED] amLODipine (NORVASC) 10 MG tablet Take 10 mg by mouth  daily.   [DISCONTINUED] omeprazole (PRILOSEC) 40 MG capsule Take 40 mg by mouth as needed.   Radiology:    No results found.  Cardiac Studies:  Carotid artery duplex 03/01/2022:  No hemodynamically significant arterial disease in the internal carotid  artery bilaterally. No significant plaque noted in the study.  Antegrade right vertebral artery flow. Antegrade left vertebral artery  flow.  Echocardiogram 03/01/2022:  Hyperdynamic LV systolic function with visual EF >70%. Left ventricle  cavity is normal in size. Normal left ventricular wall thickness. Normal  global wall motion. Normal diastolic filling pattern, normal LAP.  Mild tricuspid regurgitation. No evidence of pulmonary hypertension. RVSP  measures 32 mmHg.  Compared to 10/26/2017 LVEF is now hyperdynamic otherwise no significant  change.   Lexiscan/modified Bruce Tetrofosmin stress test 02/15/2022: Lexiscan/modified Bruce nuclear stress test performed using 1-day protocol. Patient exercised for 4.7 METS and reached 70% MPHR.  Patient was hypertensive throughout the study, BP ranging from 160-180/80 mmHg.  Stress EKG is non-diagnostic, as this is pharmacological stress test. In addition, stress EKG at 70% MPHR showed sinus tachycardia, no significant ST-T abnormality.  Mild soft tissue attenuation in inferior myocardium without any definite evidence of ischemia/ infarction. Stress LVEF 79%. Low risk study  Coronary angiogram 10/25/2017: Normal LV systolic function without wall motion abnormality. Left dominant circulation, tortuous coronary arteries but no significant disease suggestive of hypertensive heart disease. Small nondominant RCA with anterior origin. Ascending aortogram reveals presence of 3 aortic valve cusps without aortic regurgitation, no dissection was evident, no aneurysm.  EKG  09/08/2022: sinus rhythm. Normal axis. No evidence of ischemia or underlying injury. Unchanged from prior  02/02/2022: Sinus rhythm  at a rate of 64 bpm.  Normal axis.  Borderline left atrial enlargement.  No evidence of ischemia or underlying injury pattern.  Compared EKG 06/16/2019, no significant change.  EKG 06/16/2019: Normal sinus rhythm at 72 bpm, normal axis, no evidence of ischemia.   Assessment     ICD-10-CM   1. NSTEMI (non-ST elevated myocardial infarction) (HCC)  I21.4 EKG 12-Lead    2. Essential hypertension  I10     3. Mixed hyperlipidemia  E78.2       Recommendations:   Amanda Cardenas  is a 56 y.o. 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  Patient follows with pain management for chronic back, neck, and leg pain.  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  Patient was previously followed in our office given  coronary artery disease status post NSTEMI 10/2017, subsequent coronary angiography showed normal coronary arteries and she was suspected to have coronary vasospasm.  NSTEMI (non-ST elevated myocardial infarction) (HCC) EKG unchanged from prior Patient denies anginal symptoms Continue ASA and BB   Essential hypertension Continue current cardiac medications but stop amlodipine as BP is soft and pt complaining of feeling dizzy Encourage low-sodium diet, less than 2000 mg daily. Schedule imaging tests in office.   Mixed hyperlipidemia Continue statin LDL 82 in 01/2022 Will consider increasing statin next visit if patient can tolerate and lipids worsen    Follow-up in 12 months, sooner if needed.   Amanda Dieter, DO, [facility] 09/09/2022, 1:12 PM Office: [phone]

## 2022-09-09 DIAGNOSIS — E782 Mixed hyperlipidemia: Secondary | ICD-10-CM | POA: Insufficient documentation

## 2022-09-27 ENCOUNTER — Other Ambulatory Visit: Payer: Self-pay

## 2022-09-27 ENCOUNTER — Telehealth: Payer: Self-pay

## 2022-09-27 MED ORDER — LOSARTAN POTASSIUM 100 MG PO TABS: 100.0000 mg | ORAL_TABLET | Freq: Every day | ORAL | 1 refills | Status: DC

## 2022-09-27 NOTE — Telephone Encounter (Signed)
Patient called and her Bp is still high 175/120 pulse 60, its been like this for a few days now. She is taking current meds I went over them with her.

## 2022-09-27 NOTE — Telephone Encounter (Signed)
Done

## 2022-09-27 NOTE — Telephone Encounter (Signed)
Can you send losartan '100mg'$  please and let her know

## 2022-09-27 NOTE — Telephone Encounter (Signed)
Patient left vm says her bp has been running high 168/123 hr 64. I tried calling patient no answer left vm to call back.

## 2022-10-09 ENCOUNTER — Telehealth: Payer: Self-pay

## 2022-10-09 NOTE — Telephone Encounter (Signed)
Patient is schedule to see you tomorrow.

## 2022-10-09 NOTE — Telephone Encounter (Signed)
Patient called to inform us that her bp medication was changed on December 2023 due to her bp being high. Patient mention that her Yesterday bp 184/118 pulse 67. Patient mention she has been having headaches, and SOB. Denies CP, Dizziness   Patient mention that she did Losartan '100mg'$ , isosorbide mononitrate (IMDUR) 30 MG, and Metoprolol Succinate 50 mg this morning, patient mention she add the amlodipine '10mg'$  APA 81 and Furosemide 20 mg 3 hours later and her bp went down to 142/94 pulse 70

## 2022-10-10 ENCOUNTER — Ambulatory Visit: Payer: Medicare Other | Admitting: Internal Medicine

## 2022-10-10 ENCOUNTER — Encounter: Payer: Self-pay | Admitting: Internal Medicine

## 2022-10-10 VITALS — BP 161/106 | HR 75 | Resp 16 | Ht 62.0 in | Wt 182.0 lb

## 2022-10-10 DIAGNOSIS — I10 Essential (primary) hypertension: Secondary | ICD-10-CM

## 2022-10-10 NOTE — Progress Notes (Signed)
Primary Physician/Referring:  Amanda Cage, PA  Patient ID: Amanda Cardenas, female    DOB: August 26, 1966, 57 y.o.   MRN: [id number]  Chief Complaint  Patient presents with   Hypertension   Follow-up   HPI:      HPI: Amanda Cardenas  is a 57 y.o. 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  Patient follows with pain management for chronic back, neck, and leg pain.  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  Patient presents for follow-up visit. She has been doing well since the last time she was here. Patient tolerating medications without issues. Denies chest pain, shortness of breath, palpitations, diaphoresis, syncope, edema, PND, orthopnea.   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 ([place]) 10/25/2017   Baker cyst    Barrett's esophagus    Colon polyps    Gout    H/O colonoscopy 08/2016   Hx of cardiac cath 10/25/2017   NO INTERVENTION   Hypertension    Hyperthyroidism    Migraines    Vitamin D deficiency    Past Surgical History:  Procedure Laterality Date   ABDOMINAL HYSTERECTOMY     APPENDECTOMY     BIOPSY  04/15/2022   Procedure: BIOPSY;  Surgeon: Carol Ada, MD;  Location: [facility];  Service: Gastroenterology;;   carpal tunnel repair     ENDOVENOUS ABLATION SAPHENOUS VEIN W/ LASER Left 09/27/2017   endovenous laser ablation left greater saphenous vein  and stab phlebectomy > 20 incisions left leg by Tinnie Gens MD   ESOPHAGOGASTRODUODENOSCOPY (EGD) WITH PROPOFOL N/A 04/15/2022   Procedure: ESOPHAGOGASTRODUODENOSCOPY (EGD) WITH PROPOFOL;  Surgeon: Carol Ada, MD;  Location: [facility];  Service: Gastroenterology;  Laterality: N/A;   HIATAL HERNIA REPAIR  2000   LEFT HEART CATH AND CORONARY ANGIOGRAPHY N/A 10/25/2017   Procedure: LEFT HEART CATH AND CORONARY ANGIOGRAPHY;  Surgeon: Adrian Prows, MD;  Location: [facility];  Service: Cardiovascular;  Laterality: N/A;   MEDIASTERNOTOMY N/A 04/11/2018   Procedure: MEDIAN STERNOTOMY;  Surgeon: [name], MD;  Location: [facility];  Service: Thoracic;  Laterality: N/A;   THYMECTOMY N/A 04/11/2018   Procedure: THYMECTOMY;  Surgeon: [name], MD;  Location: [facility];  Service: Thoracic;  Laterality: N/A;   TONSILLECTOMY     Social History   Tobacco Use   Smoking status: Former    Packs/day: 0.25    Years: 25.00    Total pack years: 6.25    Types: E-cigarettes, Cigarettes    Quit date: 05/2019    Years since quitting: 3.4   Smokeless tobacco: Never   Tobacco comments:    trying to quit, 3 days/week patient uses vape about 3 times a day when craving nicotine   Substance Use Topics   Alcohol use: No   Marital status: Single  ROS  Review of Systems  Cardiovascular:  Negative for chest pain, claudication, dyspnea on exertion, leg swelling, near-syncope, orthopnea, palpitations, paroxysmal nocturnal dyspnea and syncope.  Neurological:  Positive for dizziness.   Objective  Blood pressure (!) 161/106, pulse 75, resp. rate 16, height '5\' 2"'$  (1.575 m), weight 182 lb (82.6 kg), last menstrual period 10/18/2011, SpO2 97 %. Body mass index is 33.29 kg/m.   Physical Exam Vitals reviewed.  Constitutional:      Appearance: She is well-developed.  Cardiovascular:     Rate and Rhythm: Normal rate and regular rhythm.     Pulses: Intact distal pulses.          Carotid pulses  are  on the left side with bruit.    Heart sounds: Normal heart sounds.  Pulmonary:     Effort: Pulmonary effort is normal. No accessory muscle usage or respiratory distress.     Breath sounds: Normal breath sounds.  Musculoskeletal:     Right lower leg: No edema.     Left lower leg: No edema.  Neurological:     Mental Status: She is alert.   Physical exam unchanged compared to previous office visit.  Laboratory examination:   Recent Labs    02/22/22 1338 04/13/22 1518 04/15/22 0349  NA 141 141 144  K 4.9 4.3 4.2  CL 103 108 109  CO2 '23 26 28  '$ GLUCOSE 96 102* 82  BUN '15 16 9  '$ CREATININE 0.92 1.06* 0.95  CALCIUM 9.2 9.1 9.3  GFRNONAA  --  >60 >60      Latest Ref Rng & Units 04/15/2022    3:49 AM 04/13/2022    3:18 PM 02/22/2022    1:38 PM  CMP  Glucose 70 - 99 mg/dL 82  102  96   BUN 6 - 20 mg/dL '9  16  15   '$ Creatinine 0.44 - 1.00 mg/dL 0.95  1.06  0.92   Sodium 135 - 145 mmol/L 144  141  141   Potassium 3.5 - 5.1 mmol/L 4.2  4.3  4.9   Chloride 98 - 111 mmol/L 109  108  103   CO2 22 - 32 mmol/L '28  26  23   '$ Calcium 8.9 - 10.3 mg/dL 9.3  9.1  9.2   Total Protein 6.5 - 8.1 g/dL 6.2     Total Bilirubin 0.3 - 1.2 mg/dL 0.7     Alkaline Phos 38 - 126 U/L 59     AST 15 - 41 U/L 12     ALT 0 - 44 U/L 15         Latest Ref Rng & Units 04/15/2022    3:49 AM 04/14/2022    6:00 AM 04/13/2022   11:05 PM  CBC  WBC 4.0 - 10.5 K/uL 7.9  9.0  11.0   Hemoglobin 12.0 - 15.0 g/dL 8.1  8.0  8.1   Hematocrit 36.0 - 46.0 % 28.5  28.0  28.6   Platelets 150 - 400 K/uL 344  320  386    Lipid Panel     Component Value Date/Time   CHOL 152 02/22/2022 1338   TRIG 131 02/22/2022 1338   HDL 47 02/22/2022 1338   CHOLHDL 3.5 10/25/2017 0617   VLDL 43 (H) 10/25/2017 0617   LDLCALC 82 02/22/2022 1338   HEMOGLOBIN A1C No results found for: "HGBA1C", "MPG" TSH No results for input(s): "TSH" in the last 8760 hours.  External Labs:  11/17/2021: BUN 14, creatinine 1.12, GFR 57, sodium  141, potassium 4.7  Allergies   Allergies  Allergen Reactions   Zofran [Ondansetron Hcl] Hives   Tramadol Nausea Only    *  ANALGESICS-OPIOIDS*   Prednisone Other (See Comments)    Pt reports hyperactivity, "makes me crazy"    Medications Prior to Visit:   Outpatient Medications Prior to Visit  Medication Sig Dispense Refill   albuterol (PROVENTIL HFA;VENTOLIN HFA) 108 (90 Base) MCG/ACT inhaler Inhale 2 puffs into the lungs every 6 (six) hours as needed for wheezing or shortness of breath.      amLODipine (NORVASC) 10 MG tablet Take 10 mg by mouth daily. Patient was asked to stopped this medication, but she restarted it on her own.     atorvastatin (LIPITOR) 10 MG tablet Take 1 tablet (10 mg total) by mouth daily. 90 tablet 3   buPROPion (WELLBUTRIN XL) 150 MG 24 hr tablet Take 150 mg by mouth 2 (two) times daily.      fluticasone (FLONASE) 50 MCG/ACT nasal spray Place 1 spray into both nostrils daily.     folic acid (FOLVITE) 1 MG tablet Take 1 tablet (1 mg total) by mouth daily. 30 tablet 1   furosemide (LASIX) 20 MG tablet Take 20 mg by mouth daily as needed for fluid.     isosorbide mononitrate (IMDUR) 30 MG 24 hr tablet Take 30 mg by mouth daily.  3   losartan (COZAAR) 100 MG tablet Take 1 tablet (100 mg total) by mouth daily. 90 tablet 1   metoprolol succinate (TOPROL-XL) 50 MG 24 hr tablet Take 50 mg by mouth daily.     oxyCODONE (ROXICODONE) 15 MG immediate release tablet Take 15 mg by mouth 4 (four) times daily as needed for pain.     pantoprazole (PROTONIX) 40 MG tablet Take 1 tablet (40 mg total) by mouth daily. 30 tablet 2   sertraline (ZOLOFT) 100 MG tablet Take 200 mg by mouth daily.     vitamin B-12 1000 MCG tablet Take 1 tablet (1,000 mcg total) by mouth daily. 30 tablet 0   amphetamine-dextroamphetamine (ADDERALL) 30 MG tablet Take 15 mg by mouth 2 (two) times daily with breakfast and lunch. IN THE MORNING & AT NOON. (Patient not taking: Reported on 10/10/2022)     ferrous  sulfate 325 (65 FE) MG tablet Take 1 tablet (325 mg total) by mouth daily. (Patient not taking: Reported on 10/10/2022) 30 tablet 3   pregabalin (LYRICA) 200 MG capsule Take 200 mg by mouth daily as needed (For pain). (Patient not taking: Reported on 10/10/2022)     No facility-administered medications prior to visit.   Final Medications at End of Visit    Current Meds  Medication Sig   albuterol (PROVENTIL HFA;VENTOLIN HFA) 108 (90 Base) MCG/ACT inhaler Inhale 2 puffs into the lungs every 6 (six) hours as needed for wheezing or shortness of breath.    amLODipine (NORVASC) 10 MG tablet Take 10 mg by mouth daily. Patient was asked to stopped this medication, but she restarted it on her own.   atorvastatin (LIPITOR) 10 MG tablet Take 1 tablet (10 mg total) by mouth daily.   buPROPion (WELLBUTRIN XL) 150 MG 24 hr tablet Take 150 mg by mouth 2 (two) times daily.    fluticasone (FLONASE) 50 MCG/ACT nasal spray Place 1 spray into both nostrils daily.   folic acid (FOLVITE) 1 MG tablet Take 1 tablet (1 mg total) by mouth daily.   furosemide (LASIX) 20 MG tablet Take 20 mg by mouth daily as needed for fluid.   isosorbide mononitrate (IMDUR) 30 MG 24 hr tablet Take 30 mg by mouth daily.   losartan (COZAAR) 100 MG  tablet Take 1 tablet (100 mg total) by mouth daily.   metoprolol succinate (TOPROL-XL) 50 MG 24 hr tablet Take 50 mg by mouth daily.   oxyCODONE (ROXICODONE) 15 MG immediate release tablet Take 15 mg by mouth 4 (four) times daily as needed for pain.   pantoprazole (PROTONIX) 40 MG tablet Take 1 tablet (40 mg total) by mouth daily.   sertraline (ZOLOFT) 100 MG tablet Take 200 mg by mouth daily.   vitamin B-12 1000 MCG tablet Take 1 tablet (1,000 mcg total) by mouth daily.   Radiology:    No results found.  Cardiac Studies:  Carotid artery duplex 03/01/2022:  No hemodynamically significant arterial disease in the internal carotid  artery bilaterally. No significant plaque noted in the study.   Antegrade right vertebral artery flow. Antegrade left vertebral artery  flow.  Echocardiogram 03/01/2022:  Hyperdynamic LV systolic function with visual EF >70%. Left ventricle  cavity is normal in size. Normal left ventricular wall thickness. Normal  global wall motion. Normal diastolic filling pattern, normal LAP.  Mild tricuspid regurgitation. No evidence of pulmonary hypertension. RVSP  measures 32 mmHg.  Compared to 10/26/2017 LVEF is now hyperdynamic otherwise no significant  change.   Lexiscan/modified Bruce Tetrofosmin stress test 02/15/2022: Lexiscan/modified Bruce nuclear stress test performed using 1-day protocol. Patient exercised for 4.7 METS and reached 70% MPHR.  Patient was hypertensive throughout the study, BP ranging from 160-180/80 mmHg.  Stress EKG is non-diagnostic, as this is pharmacological stress test. In addition, stress EKG at 70% MPHR showed sinus tachycardia, no significant ST-T abnormality.  Mild soft tissue attenuation in inferior myocardium without any definite evidence of ischemia/ infarction. Stress LVEF 79%. Low risk study  Coronary angiogram 10/25/2017: Normal LV systolic function without wall motion abnormality. Left dominant circulation, tortuous coronary arteries but no significant disease suggestive of hypertensive heart disease. Small nondominant RCA with anterior origin. Ascending aortogram reveals presence of 3 aortic valve cusps without aortic regurgitation, no dissection was evident, no aneurysm.  EKG  09/08/2022: sinus rhythm. Normal axis. No evidence of ischemia or underlying injury. Unchanged from prior  02/02/2022: Sinus rhythm at a rate of 64 bpm.  Normal axis.  Borderline left atrial enlargement.  No evidence of ischemia or underlying injury pattern.  Compared EKG 06/16/2019, no significant change.  EKG 06/16/2019: Normal sinus rhythm at 72 bpm, normal axis, no evidence of ischemia.   Assessment     ICD-10-CM   1. Essential hypertension   I10       Recommendations:   COBY ANTROBUS  is a 57 y.o. 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  Patient follows with pain management for chronic back, neck, and leg pain.  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     Essential hypertension Encourage low-sodium diet, less than 2000 mg daily. Schedule imaging tests in office.     Follow-up in 6-12 months, sooner if needed.   Floydene Flock, DO, [facility] 10/14/2022, 5:06 PM Office: [phone]

## 2022-10-17 ENCOUNTER — Telehealth: Payer: Self-pay

## 2022-10-17 NOTE — Telephone Encounter (Signed)
Initial follow up after starting home BP monitoring through [facility]. Home BP has been elevated.   Patient stated she has been taking the amlodipine, isosorbide mononitrate, metoprolol succinate and increased dose of losartan. Patient endorses experiencing a small headache today.  Systolic Blood Pressure mmHg --147.8 (465.6 - 812.7) Diastolic Blood Pressure mmHg --102.0 (88.0 - 115.0)  Heart Rate bpm -81.9 (66.0 - 105.0)   10/17/22 9:40 AM [facility] T[facility]-... 174 / 115 mmHg 78 bpm  10/16/22 3:08 PM [facility]-... 141 / 95 mmHg 85 bpm  10/16/22 10:47 AM [facility]-... 157 / 111 mmHg 76 bpm  10/15/22 10:57 AM [facility]-... 147 / 104 mmHg 66 bpm  10/14/22 7:58 PM [facility]-... 151 / 96 mmHg 75 bpm  10/14/22 12:42 PM [facility]-... 157 / 110 mmHg 83 bpm  10/13/22 12:16 PM [facility]-... 130 / 89 mmHg 76 bpm  10/12/22 10:31 AM [facility]-... 132 / 88 mmHg 80 bpm  10/11/22 10:10 AM [facility]-... 148 / 100 mmHg 105 bpm  10/10/22 6:32 PM [facility]-... 155 / 111 mmHg 92 bpm

## 2022-10-17 NOTE — Telephone Encounter (Signed)
Have her take amlodipine and toprol in the AM, isosorbide and losartan at night and see if that helps

## 2022-10-23 ENCOUNTER — Other Ambulatory Visit: Payer: Self-pay

## 2022-10-23 DIAGNOSIS — I10 Essential (primary) hypertension: Secondary | ICD-10-CM

## 2022-10-23 MED ORDER — CHLORTHALIDONE 25 MG PO TABS: 25.0000 mg | ORAL_TABLET | Freq: Every day | ORAL | 2 refills | Status: DC

## 2022-10-23 MED ORDER — METOPROLOL SUCCINATE ER 100 MG PO TB24: 100.0000 mg | ORAL_TABLET | Freq: Every day | ORAL | 2 refills | Status: DC

## 2022-10-23 NOTE — Progress Notes (Signed)
Per conversation from this weekend with Dr. Shellia Carwin due to patient elevated home BP -  Increase metoprolol succinate '50mg'$  daily to '100mg'$  daily  Added chlorthalidone '25mg'$  daily with lab follow up in one week  Systolic Blood Pressure 160.1 (130.0 - 093.2)  Diastolic Blood Pressure 355.7 (88.0 - 129.0)  Heart Rate 77.9 (62.0 - 105.0)   10/23/22 8:25 AM [facility] TMB-... 173 / 120 mmHg 70 bpm  10/23/22 8:22 AM [facility]-... 177 / 120 mmHg 72 bpm  10/23/22 8:20 AM [facility]-... 197 / 129 mmHg 69 bpm  10/23/22 8:18 AM [facility]-... 184 / 124 mmHg 71 bpm  10/22/22 10:43 AM [facility]-... 154 / 98 mmHg 78 bpm  10/21/22 9:12 PM [facility]-... 155 / 104 mmHg 62 bpm  10/21/22 11:04 AM [facility]-... 147 / 99 mmHg 69 bpm  10/21/22 10:11 AM [facility]-... 163 / 118 mmHg 69 bpm  10/20/22 10:29 PM [facility]-... 151 / 95 mmHg 68 bpm  10/20/22 10:16 AM [facility]-... 161 / 105 mmHg 77 bpm

## 2022-10-31 ENCOUNTER — Other Ambulatory Visit: Payer: Self-pay | Admitting: Internal Medicine

## 2022-10-31 DIAGNOSIS — I10 Essential (primary) hypertension: Secondary | ICD-10-CM

## 2022-11-17 LAB — BASIC METABOLIC PANEL
BUN/Creatinine Ratio: 12 (ref 9–23)
BUN: 13 mg/dL (ref 6–24)
CO2: 25 mmol/L (ref 20–29)
Calcium: 9.7 mg/dL (ref 8.7–10.2)
Chloride: 100 mmol/L (ref 96–106)
Creatinine, Ser: 1.06 mg/dL — ABNORMAL HIGH (ref 0.57–1.00)
Glucose: 162 mg/dL — ABNORMAL HIGH (ref 70–99)
Potassium: 4 mmol/L (ref 3.5–5.2)
Sodium: 141 mmol/L (ref 134–144)
eGFR: 61 mL/min/{1.73_m2} (ref >59)

## 2022-11-21 ENCOUNTER — Encounter: Payer: Self-pay | Admitting: Internal Medicine

## 2022-11-21 ENCOUNTER — Telehealth: Payer: Self-pay

## 2022-11-21 ENCOUNTER — Ambulatory Visit: Payer: Medicare Other | Admitting: Internal Medicine

## 2022-11-21 VITALS — BP 107/66 | HR 60 | Ht 62.0 in | Wt 178.8 lb

## 2022-11-21 DIAGNOSIS — I201 Angina pectoris with documented spasm: Secondary | ICD-10-CM

## 2022-11-21 DIAGNOSIS — I10 Essential (primary) hypertension: Secondary | ICD-10-CM

## 2022-11-21 DIAGNOSIS — E782 Mixed hyperlipidemia: Secondary | ICD-10-CM

## 2022-11-21 NOTE — Progress Notes (Signed)
Primary Physician/Referring:  Milford Cage, PA  Patient ID: Amanda Cardenas, female    DOB: [birth year], 57 y.o.   MRN: [id number]  Chief Complaint  Patient presents with   Hypertension   Follow-up   HPI:      HPI: Amanda Cardenas  is a 57 y.o. 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  Patient follows with pain management for chronic back, neck, and leg pain.  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  Patient presents for follow-up visit. She has been doing well since the last time she was here. Patient tolerating medications without issues. She doesn't really know why she is on Lasix since she is not generally swollen. We will change lasix to PRN and maybe this is why patient feels dizzy sometimes. Her blood pressures are much better controlled now. Denies chest pain, shortness of breath, palpitations, diaphoresis, syncope, edema, PND, orthopnea.   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 ([place]) 10/25/2017   Baker cyst    Barrett's esophagus    Colon polyps    Gout    H/O colonoscopy 08/2016   Hx of cardiac cath 10/25/2017   NO INTERVENTION   Hypertension    Hyperthyroidism    Migraines    Vitamin D deficiency    Past Surgical History:  Procedure Laterality Date   ABDOMINAL HYSTERECTOMY     APPENDECTOMY     BIOPSY  04/15/2022   Procedure: BIOPSY;  Surgeon: Carol Ada, MD;  Location: [facility];  Service: Gastroenterology;;   carpal tunnel repair     ENDOVENOUS ABLATION SAPHENOUS VEIN W/ LASER Left 09/27/2017   endovenous laser ablation left greater saphenous vein and stab phlebectomy > 20 incisions left leg by Tinnie Gens MD   ESOPHAGOGASTRODUODENOSCOPY (EGD) WITH PROPOFOL N/A 04/15/2022   Procedure: ESOPHAGOGASTRODUODENOSCOPY (EGD) WITH PROPOFOL;  Surgeon: Carol Ada, MD;  Location: [facility];  Service: Gastroenterology;  Laterality: N/A;   HIATAL HERNIA REPAIR  2000   LEFT HEART CATH AND CORONARY ANGIOGRAPHY N/A 10/25/2017   Procedure: LEFT HEART CATH AND CORONARY ANGIOGRAPHY;  Surgeon: Adrian Prows, MD;  Location: [facility];  Service: Cardiovascular;  Laterality: N/A;   MEDIASTERNOTOMY N/A 04/11/2018   Procedure: MEDIAN STERNOTOMY;  Surgeon: [name], MD;  Location: [facility];  Service: Thoracic;  Laterality: N/A;   THYMECTOMY N/A 04/11/2018   Procedure: THYMECTOMY;  Surgeon: [name], MD;  Location: [facility];  Service: Thoracic;  Laterality: N/A;   TONSILLECTOMY     Social History   Tobacco Use   Smoking status: Former    Packs/day: 0.25    Years: 25.00    Total pack years: 6.25    Types: E-cigarettes, Cigarettes    Quit date: 05/2019    Years since quitting: 3.5   Smokeless tobacco: Never   Tobacco comments:    trying to quit, 3 days/week patient uses vape about 3 times a day when craving nicotine   Substance Use Topics  Alcohol use: No   Marital status: Single  ROS  Review of Systems  Cardiovascular:  Negative for chest pain, claudication, dyspnea on exertion, leg swelling, near-syncope, orthopnea, palpitations, paroxysmal nocturnal dyspnea and syncope.  Neurological:  Positive for dizziness.   Objective  Blood pressure 107/66, pulse 60, height 5' 2"$  (1.575 m), weight 178 lb 12.8 oz (81.1 kg), last menstrual period 10/18/2011, SpO2 96 %. Body mass index is 32.7 kg/m.   Physical Exam Vitals  reviewed.  Constitutional:      Appearance: She is well-developed.  Cardiovascular:     Rate and Rhythm: Normal rate and regular rhythm.     Pulses: Intact distal pulses.          Carotid pulses are  on the left side with bruit.    Heart sounds: Normal heart sounds.  Pulmonary:     Effort: Pulmonary effort is normal. No accessory muscle usage or respiratory distress.     Breath sounds: Normal breath sounds.  Musculoskeletal:     Right lower leg: No edema.     Left lower leg: No edema.  Neurological:     Mental Status: She is alert.   Physical exam unchanged compared to previous office visit.  Laboratory examination:   Recent Labs    04/13/22 1518 04/15/22 0349 11/16/22 1411  NA 141 144 141  K 4.3 4.2 4.0  CL 108 109 100  CO2 26 28 25  $ GLUCOSE 102* 82 162*  BUN 16 9 13  $ CREATININE 1.06* 0.95 1.06*  CALCIUM 9.1 9.3 9.7  GFRNONAA >60 >60  --       Latest Ref Rng & Units 11/16/2022    2:11 PM 04/15/2022    3:49 AM 04/13/2022    3:18 PM  CMP  Glucose 70 - 99 mg/dL 162  82  102   BUN 6 - 24 mg/dL 13  9  16   $ Creatinine 0.57 - 1.00 mg/dL 1.06  0.95  1.06   Sodium 134 - 144 mmol/L 141  144  141   Potassium 3.5 - 5.2 mmol/L 4.0  4.2  4.3   Chloride 96 - 106 mmol/L 100  109  108   CO2 20 - 29 mmol/L 25  28  26   $ Calcium 8.7 - 10.2 mg/dL 9.7  9.3  9.1   Total Protein 6.5 - 8.1 g/dL  6.2    Total Bilirubin 0.3 - 1.2 mg/dL  0.7    Alkaline Phos 38 - 126 U/L  59    AST 15 - 41 U/L  12    ALT 0 - 44 U/L  15        Latest Ref Rng & Units 04/15/2022    3:49 AM 04/14/2022    6:00 AM 04/13/2022   11:05 PM  CBC  WBC 4.0 - 10.5 K/uL 7.9  9.0  11.0   Hemoglobin 12.0 - 15.0 g/dL 8.1  8.0  8.1   Hematocrit 36.0 - 46.0 % 28.5  28.0  28.6   Platelets 150 - 400 K/uL 344  320  386    Lipid Panel     Component Value Date/Time   CHOL 152 02/22/2022 1338   TRIG 131 02/22/2022 1338   HDL 47 02/22/2022 1338   CHOLHDL 3.5 10/25/2017 0617   VLDL 43 (H) 10/25/2017 0617   LDLCALC 82  02/22/2022 1338   HEMOGLOBIN A1C No results found for: "HGBA1C", "MPG" TSH No results for input(s): "TSH" in the last 8760 hours.  External Labs:  11/17/2021: BUN 14, creatinine 1.12, GFR 57, sodium 141, potassium 4.7  Allergies   Allergies  Allergen Reactions   Zofran [Ondansetron Hcl] Hives   Tramadol Nausea Only    *ANALGESICS-OPIOIDS*   Prednisone Other (See Comments)    Pt reports hyperactivity, "makes me crazy"    Medications Prior to Visit:   Outpatient Medications Prior to Visit  Medication Sig Dispense Refill   albuterol (PROVENTIL HFA;VENTOLIN HFA) 108 (90 Base) MCG/ACT inhaler Inhale 2 puffs into the lungs every 6 (six) hours as needed for wheezing or shortness of breath.      amLODipine (NORVASC) 10 MG tablet Take 10 mg by mouth daily. Patient was asked to stopped this medication, but she restarted it on her own.     amphetamine-dextroamphetamine (ADDERALL) 30 MG tablet Take 15 mg by mouth 2 (two) times daily with breakfast and lunch. IN THE MORNING & AT NOON.     atorvastatin (LIPITOR) 10 MG tablet Take 1 tablet (10 mg total) by mouth daily. 90 tablet 3   buPROPion (WELLBUTRIN XL) 150 MG 24 hr tablet Take 150 mg by mouth 2 (two) times daily.      chlorthalidone (HYGROTON) 25 MG tablet TAKE 1 TABLET (25 MG TOTAL) BY MOUTH DAILY. 90 tablet 0   fluticasone (FLONASE) 50 MCG/ACT nasal spray Place 1 spray into both nostrils daily.     folic acid (FOLVITE) 1 MG tablet Take 1 tablet (1 mg total) by mouth daily. 30 tablet 1   isosorbide mononitrate (IMDUR) 30 MG 24 hr tablet Take 30 mg by mouth daily.  3   losartan (COZAAR) 100 MG tablet Take 1 tablet (100 mg total) by mouth daily. 90 tablet 1   meloxicam (MOBIC) 15 MG tablet Take 15 mg by mouth daily.     metoprolol succinate (TOPROL-XL) 100 MG 24 hr tablet TAKE 1 TABLET BY MOUTH EVERY DAY 90 tablet 0   oxyCODONE (ROXICODONE) 15 MG immediate release tablet Take 15 mg by mouth 4 (four) times daily as needed for pain.      pantoprazole (PROTONIX) 40 MG tablet Take 1 tablet (40 mg total) by mouth daily. 30 tablet 2   pregabalin (LYRICA) 200 MG capsule Take 200 mg by mouth daily as needed (For pain).     sertraline (ZOLOFT) 100 MG tablet Take 200 mg by mouth daily.     ferrous sulfate 325 (65 FE) MG tablet Take 1 tablet (325 mg total) by mouth daily. (Patient not taking: Reported on 10/10/2022) 30 tablet 3   furosemide (LASIX) 20 MG tablet Take 20 mg by mouth daily as needed for fluid.     predniSONE (DELTASONE) 5 MG tablet Take 5 mg by mouth daily. (Patient not taking: Reported on 11/21/2022)     vitamin B-12 1000 MCG tablet Take 1 tablet (1,000 mcg total) by mouth daily. (Patient not taking: Reported on 11/21/2022) 30 tablet 0   No facility-administered medications prior to visit.   Final Medications at End of Visit    Current Meds  Medication Sig   albuterol (PROVENTIL HFA;VENTOLIN HFA) 108 (90 Base) MCG/ACT inhaler Inhale 2 puffs into the lungs every 6 (six) hours as needed for wheezing or shortness of breath.    amLODipine (NORVASC) 10 MG tablet Take 10 mg by mouth daily. Patient was asked to stopped this medication, but she restarted it on her own.   amphetamine-dextroamphetamine (ADDERALL) 30 MG tablet Take 15 mg by mouth 2 (two) times daily with breakfast and lunch.  IN THE MORNING & AT NOON.   atorvastatin (LIPITOR) 10 MG tablet Take 1 tablet (10 mg total) by mouth daily.   buPROPion (WELLBUTRIN XL) 150 MG 24 hr tablet Take 150 mg by mouth 2 (two) times daily.    chlorthalidone (HYGROTON) 25 MG tablet TAKE 1 TABLET (25 MG TOTAL) BY MOUTH DAILY.   fluticasone (FLONASE) 50 MCG/ACT nasal spray Place 1 spray into both nostrils daily.   folic acid (FOLVITE) 1 MG tablet Take 1 tablet (1 mg total) by mouth daily.   isosorbide mononitrate (IMDUR) 30 MG 24 hr tablet Take 30 mg by mouth daily.   losartan (COZAAR) 100 MG tablet Take 1 tablet (100 mg total) by mouth daily.   meloxicam (MOBIC) 15 MG tablet Take 15 mg by  mouth daily.   metoprolol succinate (TOPROL-XL) 100 MG 24 hr tablet TAKE 1 TABLET BY MOUTH EVERY DAY   oxyCODONE (ROXICODONE) 15 MG immediate release tablet Take 15 mg by mouth 4 (four) times daily as needed for pain.   pantoprazole (PROTONIX) 40 MG tablet Take 1 tablet (40 mg total) by mouth daily.   pregabalin (LYRICA) 200 MG capsule Take 200 mg by mouth daily as needed (For pain).   sertraline (ZOLOFT) 100 MG tablet Take 200 mg by mouth daily.   Radiology:    No results found.  Cardiac Studies:  Carotid artery duplex 03/01/2022:  No hemodynamically significant arterial disease in the internal carotid  artery bilaterally. No significant plaque noted in the study.  Antegrade right vertebral artery flow. Antegrade left vertebral artery  flow.  Echocardiogram 03/01/2022:  Hyperdynamic LV systolic function with visual EF >70%. Left ventricle  cavity is normal in size. Normal left ventricular wall thickness. Normal  global wall motion. Normal diastolic filling pattern, normal LAP.  Mild tricuspid regurgitation. No evidence of pulmonary hypertension. RVSP  measures 32 mmHg.  Compared to 10/26/2017 LVEF is now hyperdynamic otherwise no significant  change.   Lexiscan/modified Bruce Tetrofosmin stress test 02/15/2022: Lexiscan/modified Bruce nuclear stress test performed using 1-day protocol. Patient exercised for 4.7 METS and reached 70% MPHR.  Patient was hypertensive throughout the study, BP ranging from 160-180/80 mmHg.  Stress EKG is non-diagnostic, as this is pharmacological stress test. In addition, stress EKG at 70% MPHR showed sinus tachycardia, no significant ST-T abnormality.  Mild soft tissue attenuation in inferior myocardium without any definite evidence of ischemia/ infarction. Stress LVEF 79%. Low risk study  Coronary angiogram 10/25/2017: Normal LV systolic function without wall motion abnormality. Left dominant circulation, tortuous coronary arteries but no significant  disease suggestive of hypertensive heart disease. Small nondominant RCA with anterior origin. Ascending aortogram reveals presence of 3 aortic valve cusps without aortic regurgitation, no dissection was evident, no aneurysm.  EKG  09/08/2022: sinus rhythm. Normal axis. No evidence of ischemia or underlying injury. Unchanged from prior  02/02/2022: Sinus rhythm at a rate of 64 bpm.  Normal axis.  Borderline left atrial enlargement.  No evidence of ischemia or underlying injury pattern.  Compared EKG 06/16/2019, no significant change.  EKG 06/16/2019: Normal sinus rhythm at 72 bpm, normal axis, no evidence of ischemia.   Assessment     ICD-10-CM   1. Mixed hyperlipidemia  E78.2     2. Essential hypertension  I10     3. Coronary vasospasm (HCC)  I20.1       Recommendations:   Amanda Cardenas  is a 56 y.o. 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  Patient follows with pain management for chronic back, neck, and leg pain.  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  Mixed hyperlipidemia Continue lipitor   Coronary vasospasm (HCC) Continue toprol   Essential hypertension Encourage low-sodium diet, less than 2000 mg daily. Lasix should only be taken PRN, patient does not have edema regularly   Patient home blood pressure is better controlled with current antihypertensive regimen.   Increased metoprolol succinate 69m daily to 1049mdaily Added chlorthalidone 2561maily with lab follow up in one week Now pressure is much better controlled    Systolic Blood Pressure mmHg -- 134.7 ([id number] Blood Pressure mmHg -- 92.2 (72.0 - 103.0) Heart Rate bpm -- 81.6 (69.0 - 101.0)   11/20/22 9:25 AM         Transtek TMB-...         146      /           96        mmHg  73        bpm      11/18/22 9:15 PM         Transtek TMB-...         129      /           87        mmHg  90        bpm      11/18/22 9:02 AM         Transtek TMB-...         98        /           72        mmHg  69        bpm      11/17/22 9:41 AM         Transtek TMB-...         120      /           84        mmHg  69        bpm      11/16/22 10:08 AM       Transtek TMB-...         123      /           79        mmHg  89        bpm      11/14/22 9:48 AM         Transtek TMB-...         131      /           91        mmHg  84        bpm      11/13/22 12:00 PM       Transtek TMB-...         145      /           94        mmHg  101  bpm      11/10/22 12:43 PM         Transtek TMB-...         141      /           97        mmHg  89        bpm      11/09/22 12:25 PM         Transtek TMB-...         131      /           98        mmHg  83        bpm      11/08/22 4:16 PM           Transtek TMB-...         153      /           103      mmHg  81        bpm     Follow-up in 6 months, sooner if needed.    Amanda Flock, DO, [facility] 11/21/2022, 1:37 PM Office: [phone]

## 2022-11-21 NOTE — Telephone Encounter (Signed)
Patient home blood pressure is better controlled with current antihypertensive regimen.   Systolic Blood Pressure mmHg -- 134.7 ([id number] - 123456) Diastolic Blood Pressure mmHg -- 92.2 (72.0 - 103.0) Heart Rate bpm -- 81.6 (69.0 - 101.0)  11/20/22 9:25 AM Transtek TMB-... 146 / 96 mmHg 73 bpm  11/18/22 9:15 PM Transtek TMB-... 129 / 87 mmHg 90 bpm  11/18/22 9:02 AM Transtek TMB-... 98 / 72 mmHg 69 bpm  11/17/22 9:41 AM Transtek TMB-... 120 / 84 mmHg 69 bpm  11/16/22 10:08 AM Transtek TMB-... 123 / 79 mmHg 89 bpm  11/14/22 9:48 AM Transtek TMB-... 131 / 91 mmHg 84 bpm  11/13/22 12:00 PM Transtek TMB-... 145 / 94 mmHg 101 bpm  11/10/22 12:43 PM Transtek TMB-... 141 / 97 mmHg 89 bpm  11/09/22 12:25 PM Transtek TMB-... 131 / 98 mmHg 83 bpm  11/08/22 4:16 PM Transtek TMB-... 153 / 103 mmHg 81 bpm

## 2023-03-13 ENCOUNTER — Telehealth: Payer: Self-pay

## 2023-03-13 ENCOUNTER — Ambulatory Visit: Payer: Medicare Other | Admitting: Cardiology

## 2023-03-13 VITALS — BP 120/79 | HR 58 | Resp 16 | Ht 62.0 in | Wt 177.4 lb

## 2023-03-13 DIAGNOSIS — R7989 Other specified abnormal findings of blood chemistry: Secondary | ICD-10-CM

## 2023-03-13 DIAGNOSIS — I10 Essential (primary) hypertension: Secondary | ICD-10-CM

## 2023-03-13 DIAGNOSIS — R0683 Snoring: Secondary | ICD-10-CM

## 2023-03-13 NOTE — Progress Notes (Addendum)
Follow up visit  Subjective:   Amanda Cardenas, female    DOB: 1965-10-22, 57 y.o.   MRN: [id number]   HPI  Chief Complaint  Patient presents with   Hypertension         57 y.o. Caucasian female with hypertension, hyperlipidemia, solitary kidney, h/o NSTEMI with no obstructive CAD (2019)  Patient is currently on multiple antihypertensive agents. BP had been high last few days, but is now controlled. She reports fatigue and snoring., She has never had any sleep study. She denies exertional chest pain, does report mild dyspnea.    Current Outpatient Medications:    albuterol (PROVENTIL HFA;VENTOLIN HFA) 108 (90 Base) MCG/ACT inhaler, Inhale 2 puffs into the lungs every 6 (six) hours as needed for wheezing or shortness of breath. , Disp: , Rfl:    amLODipine (NORVASC) 10 MG tablet, Take 10 mg by mouth daily. Patient was asked to stopped this medication, but she restarted it on her own., Disp: , Rfl:    amphetamine-dextroamphetamine (ADDERALL) 30 MG tablet, Take 15 mg by mouth as needed. IN THE MORNING & AT NOON., Disp: , Rfl:    atorvastatin (LIPITOR) 10 MG tablet, Take 1 tablet (10 mg total) by mouth daily. (Patient taking differently: Take 20 mg by mouth daily.), Disp: 90 tablet, Rfl: 3   buPROPion (WELLBUTRIN XL) 150 MG 24 hr tablet, Take 150 mg by mouth 2 (two) times daily. , Disp: , Rfl:    chlorthalidone (HYGROTON) 25 MG tablet, TAKE 1 TABLET (25 MG TOTAL) BY MOUTH DAILY., Disp: 90 tablet, Rfl: 0   fluticasone (FLONASE) 50 MCG/ACT nasal spray, Place 1 spray into both nostrils daily., Disp: , Rfl:    furosemide (LASIX) 20 MG tablet, Take 20 mg by mouth daily as needed for fluid., Disp: , Rfl:    isosorbide mononitrate (IMDUR) 30 MG 24 hr tablet, Take 30 mg by mouth daily., Disp: , Rfl: 3   losartan (COZAAR) 100 MG tablet, Take 1 tablet (100 mg total) by mouth daily., Disp: 90 tablet, Rfl: 1   meloxicam (MOBIC) 15 MG tablet, Take 15 mg by mouth as needed., Disp: , Rfl:    metoprolol  succinate (TOPROL-XL) 100 MG 24 hr tablet, TAKE 1 TABLET BY MOUTH EVERY DAY, Disp: 90 tablet, Rfl: 0   oxyCODONE (ROXICODONE) 15 MG immediate release tablet, Take 15 mg by mouth 4 (four) times daily as needed for pain., Disp: , Rfl:    pantoprazole (PROTONIX) 40 MG tablet, Take 1 tablet (40 mg total) by mouth daily., Disp: 30 tablet, Rfl: 2   pregabalin (LYRICA) 200 MG capsule, Take 200 mg by mouth daily as needed (For pain)., Disp: , Rfl:    sertraline (ZOLOFT) 100 MG tablet, Take 200 mg by mouth daily., Disp: , Rfl:    vitamin B-12 1000 MCG tablet, Take 1 tablet (1,000 mcg total) by mouth daily. (Patient not taking: Reported on 03/13/2023), Disp: 30 tablet, Rfl: 0   Cardiovascular & other pertient studies:  Reviewed external labs and tests, independently interpreted  EKG 09/08/2022: Sinus rhythm Normal EKG  Echocardiogram 03/01/2022: Hyperdynamic LV systolic function with visual EF >70%. Left ventricle cavity is normal in size. Normal left ventricular wall thickness. Normal global wall motion. Normal diastolic filling pattern, normal LAP. Mild tricuspid regurgitation. No evidence of pulmonary hypertension. RVSP measures 32 mmHg. Compared to 10/26/2017 LVEF is now hyperdynamic otherwise no significant change.  Recent labs: 11/16/2022: Glucose 162, BUN/Cr 13/1.06. EGFR 61. Na/K 141/4.0. Rest of the CMP normal  01/2022: Chol 152, TG 131, HDL 47, LDL 82   Review of Systems  Cardiovascular:  Negative for chest pain, dyspnea on exertion, leg swelling, palpitations and syncope.         Vitals:   03/13/23 1327  BP: 120/79  Pulse: (!) 58  Resp: 16  SpO2: 95%    Body mass index is 32.45 kg/m. Filed Weights   03/13/23 1327  Weight: 177 lb 6.4 oz (80.5 kg)     Objective:   Physical Exam Vitals and nursing note reviewed.  Constitutional:      General: She is not in acute distress. Neck:     Vascular: No JVD.  Cardiovascular:     Rate and Rhythm: Normal rate and regular  rhythm.     Heart sounds: Normal heart sounds. No murmur heard. Pulmonary:     Effort: Pulmonary effort is normal.     Breath sounds: Normal breath sounds. No wheezing or rales.  Musculoskeletal:     Right lower leg: No edema.     Left lower leg: No edema.             Visit diagnoses:   ICD-10-CM   1. Snoring  R06.83 Ambulatory referral to Sleep Studies    2. Essential hypertension  I10 Ambulatory referral to Sleep Studies    PCV RENAL/RENAL ARTERY DUPLEX COMPLETE    TSH    Aldosterone + renin activity w/ ratio    Basic metabolic panel       Orders Placed This Encounter  Procedures   TSH   Aldosterone + renin activity w/ ratio   Basic metabolic panel   Ambulatory referral to Sleep Studies   PCV RENAL/RENAL ARTERY DUPLEX COMPLETE        Assessment & Recommendations:   58 y.o. Caucasian female with hypertension, hyperlipidemia, solitary kidney, h/o NSTEMI with no obstructive CAD (2019)  Hypertension: Controlled today, but labile in general. No changes made today. Check renal artery duplex, renin/aldosterone, sleep study.      Elder Negus, MD Pager: [phone] Office: [phone]

## 2023-03-16 ENCOUNTER — Ambulatory Visit: Payer: Medicare Other

## 2023-03-21 LAB — ALDOSTERONE + RENIN ACTIVITY W/ RATIO
Aldos/Renin Ratio: 18.5 (ref 0.0–30.0)
Aldosterone: 6.6 ng/dL (ref 0.0–30.0)
Renin Activity, Plasma: 0.357 ng/mL/hr (ref 0.167–5.380)

## 2023-03-21 LAB — TSH: TSH: 0.127 u[IU]/mL — ABNORMAL LOW (ref 0.450–4.500)

## 2023-03-22 NOTE — Progress Notes (Signed)
Yes. Please let her know the above reasons.  Thanks MJP

## 2023-03-22 NOTE — Progress Notes (Signed)
Pt asked for endocrinologist referral she said she saw one and didn't like him?

## 2023-03-22 NOTE — Progress Notes (Signed)
TSH low. It may suggests elevated thyroid function, which could cause hypertension. Need to check free T4. Please check with [facility] if it can be checked on the same sample from 6/12. If not, I have ordered it separately. Also, please forward the results to her PCP.  Thanks MJP

## 2023-03-22 NOTE — Progress Notes (Signed)
Defer to PCP.  Thanks MJP

## 2023-03-22 NOTE — Progress Notes (Signed)
labs faxed to pcp [phone] Patient aware

## 2023-03-22 NOTE — Addendum Note (Signed)
Addended by: Elder Negus on: 03/22/2023 10:18 AM   Modules accepted: Orders

## 2023-03-22 NOTE — Progress Notes (Signed)
Labs have already been discarded, does she need to go back?

## 2023-03-23 NOTE — Progress Notes (Signed)
Sent mychart message to patient

## 2023-03-27 ENCOUNTER — Other Ambulatory Visit: Payer: Self-pay | Admitting: Internal Medicine

## 2023-04-10 ENCOUNTER — Other Ambulatory Visit (HOSPITAL_COMMUNITY): Payer: Self-pay | Admitting: Cardiology

## 2023-04-10 ENCOUNTER — Other Ambulatory Visit: Payer: Self-pay | Admitting: Physician Assistant

## 2023-04-10 ENCOUNTER — Ambulatory Visit: Payer: Medicare Other

## 2023-04-10 DIAGNOSIS — Z1231 Encounter for screening mammogram for malignant neoplasm of breast: Secondary | ICD-10-CM

## 2023-04-11 LAB — BASIC METABOLIC PANEL
BUN/Creatinine Ratio: 25 — ABNORMAL HIGH (ref 9–23)
BUN: 33 mg/dL — ABNORMAL HIGH (ref 6–24)
CO2: 27 mmol/L (ref 20–29)
Calcium: 9.6 mg/dL (ref 8.7–10.2)
Chloride: 104 mmol/L (ref 96–106)
Creatinine, Ser: 1.32 mg/dL — ABNORMAL HIGH (ref 0.57–1.00)
Glucose: 118 mg/dL — ABNORMAL HIGH (ref 70–99)
Potassium: 5 mmol/L (ref 3.5–5.2)
Sodium: 146 mmol/L — ABNORMAL HIGH (ref 134–144)
eGFR: 47 mL/min/{1.73_m2} — ABNORMAL LOW (ref >59)

## 2023-04-11 LAB — T4, FREE: Free T4: 1.1 ng/dL (ref 0.82–1.77)

## 2023-04-12 ENCOUNTER — Ambulatory Visit
Admission: RE | Admit: 2023-04-12 | Discharge: 2023-04-12 | Disposition: A | Discharge status: Home / Self Care | Payer: Medicare Other | Source: Ambulatory Visit | Attending: Physician Assistant | Admitting: Physician Assistant

## 2023-04-12 DIAGNOSIS — Z1231 Encounter for screening mammogram for malignant neoplasm of breast: Secondary | ICD-10-CM

## 2023-04-16 ENCOUNTER — Ambulatory Visit: Payer: Medicare Other | Admitting: Cardiology

## 2023-04-17 ENCOUNTER — Ambulatory Visit (INDEPENDENT_AMBULATORY_CARE_PROVIDER_SITE_OTHER): Payer: Medicare Other | Admitting: Neurology

## 2023-04-17 ENCOUNTER — Encounter: Payer: Self-pay | Admitting: Neurology

## 2023-04-17 VITALS — BP 111/76 | HR 71 | Ht 62.0 in | Wt 170.0 lb

## 2023-04-17 DIAGNOSIS — E669 Obesity, unspecified: Secondary | ICD-10-CM

## 2023-04-17 DIAGNOSIS — Z9189 Other specified personal risk factors, not elsewhere classified: Secondary | ICD-10-CM | POA: Diagnosis not present

## 2023-04-17 DIAGNOSIS — R0683 Snoring: Secondary | ICD-10-CM | POA: Diagnosis not present

## 2023-04-17 DIAGNOSIS — G4719 Other hypersomnia: Secondary | ICD-10-CM

## 2023-04-17 DIAGNOSIS — Z820 Family history of epilepsy and other diseases of the nervous system: Secondary | ICD-10-CM | POA: Diagnosis not present

## 2023-04-17 DIAGNOSIS — F17200 Nicotine dependence, unspecified, uncomplicated: Secondary | ICD-10-CM

## 2023-04-17 DIAGNOSIS — Z79891 Long term (current) use of opiate analgesic: Secondary | ICD-10-CM

## 2023-04-17 NOTE — Progress Notes (Signed)
Subjective:    Patient ID: Amanda Cardenas is a 57 y.o. female.  HPI    Huston Foley, MD, PhD [facility] [address] [address] [place], [place] [address]  Dear Dr. Rosemary Holms,  I saw your patient, Amanda Cardenas, upon your kind request in my sleep clinic today for initial consultation of her sleep disorder, in particular, concern for underlying obstructive sleep apnea.  The patient is accompanied by her daughter today.  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 Her Epworth sleepiness score is 13 out of 24, fatigue severity score is 50 out of 63.  Of note, she is on multiple potentially sedating medications including oxycodone 15 mg 4 times a day as needed, she usually ends up taking 2 to 3/day.  She takes high-dose Lyrica 200 mg daily as needed but reports that she does not take it daily, maybe 2-3 times a week.  She is on sertraline 100 mg daily, this was reduced from 200 mg daily recently per PCP.    She also takes a beta-blocker, she takes Adderall 30 mg strength half a pill p.o for ADHD.  I reviewed your office note from 03/13/2023. 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  Her mom has sleep apnea and uses a CPAP machine.  Patient does not wake up fully rested.  She is working on smoking cessation, currently smokes about 6 to 8 cigarettes/day and also vapes.  She does not drink any alcohol.  She drinks caffeine in the form of coffee and tea, about 2-3 servings per day on average.  She lives with her daughter.  She is divorced.  She has 1 cat in the  household.  She does have a TV in her bedroom, tends to stay on all night.  Her Past Medical History Is Significant For: 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 (HCC) 10/25/2017   Baker cyst    Barrett's esophagus    Colon polyps    Gout    H/O colonoscopy 08/2016   Hx of cardiac cath 10/25/2017   NO INTERVENTION   Hypertension    Hyperthyroidism    Migraines    Vitamin D deficiency     Her Past Surgical History Is Significant For: Past Surgical History:  Procedure Laterality Date   ABDOMINAL HYSTERECTOMY     APPENDECTOMY     BIOPSY  04/15/2022   Procedure: BIOPSY;  Surgeon: Jeani Hawking, MD;  Location: [facility];  Service: Gastroenterology;;   carpal tunnel repair     ENDOVENOUS ABLATION SAPHENOUS VEIN W/ LASER Left 09/27/2017   endovenous laser ablation left greater saphenous vein and stab phlebectomy > 20 incisions left leg by Josephina Gip MD   ESOPHAGOGASTRODUODENOSCOPY (EGD) WITH PROPOFOL N/A 04/15/2022   Procedure: ESOPHAGOGASTRODUODENOSCOPY (EGD) WITH PROPOFOL;  Surgeon: Jeani Hawking, MD;  Location: [facility];  Service: Gastroenterology;  Laterality: N/A;   HIATAL HERNIA REPAIR  2000   LEFT HEART CATH AND CORONARY ANGIOGRAPHY  N/A 10/25/2017   Procedure: LEFT HEART CATH AND CORONARY ANGIOGRAPHY;  Surgeon: Yates Decamp, MD;  Location: [facility];  Service: Cardiovascular;  Laterality: N/A;   MEDIASTERNOTOMY N/A 04/11/2018   Procedure: MEDIAN STERNOTOMY;  Surgeon: Loreli Slot, MD;  Location: [facility] OR;  Service: Thoracic;  Laterality: N/A;   THYMECTOMY N/A 04/11/2018   Procedure: THYMECTOMY;  Surgeon: Loreli Slot, MD;  Location: [facility] OR;  Service: Thoracic;  Laterality: N/A;   TONSILLECTOMY      Her Family History Is Significant For: Family History  Problem Relation Age  of Onset   Hypertension Mother    Hyperlipidemia Mother    Diabetes Mellitus II Mother    Heart disease Mother    Depression Mother    Sleep apnea Mother    Thyroid disease Cousin     Her Social History Is Significant For: Social History   Socioeconomic History   Marital status: Single    Spouse name: Not on file   Number of children: 4   Years of education: Not on file   Highest education level: Not on file  Occupational History   Occupation: unemployed  Tobacco Use   Smoking status: Former    Current packs/day: 0.00    Average packs/day: 0.3 packs/day for 25.0 years (6.3 ttl pk-yrs)    Types: E-cigarettes, Cigarettes    Start date: 05/1994    Quit date: 05/2019    Years since quitting: 3.9   Smokeless tobacco: Never   Tobacco comments:    trying to quit, 3 days/week patient uses vape about 3 times a day when craving nicotine   Vaping Use   Vaping status: Former   Quit date: 07/02/2022   Substances: Nicotine  Substance and Sexual Activity   Alcohol use: No   Drug use: No   Sexual activity: Not Currently  Other Topics Concern   Not on file  Social History Narrative   Not on file   Social Determinants of Health   Financial Resource Strain: Medium Risk (08/07/2021)   Received from [facility], [facility]   Overall Financial Resource Strain (CARDIA)    Difficulty of Paying Living Expenses: Somewhat hard  Food Insecurity: No Food Insecurity (11/17/2021)   Received from [facility], [facility]   Hunger Vital Sign    Worried About Running Out of Food in the Last Year: Never true    Ran Out of Food in the Last Year: Never true  Transportation Needs: No Transportation Needs (08/07/2021)   Received from [facility], [facility]   PRAPARE - Transportation    Lack of Transportation (Medical): No    Lack of Transportation (Non-Medical): No  Physical Activity: Inactive (08/07/2021)   Received from [facility], [facility]   Exercise Vital Sign    Days  of Exercise per Week: 0 days    Minutes of Exercise per Session: 0 min  Stress: Stress Concern Present (08/07/2021)   Received from [facility], [facility] - Occupational Stress Questionnaire    Feeling of Stress : Very much  Social Connections: Unknown (02/02/2022)   Received from [facility], [facility]   Social Network    Social Network: Not on file    Her Allergies Are:  Allergies  Allergen Reactions   Zofran [Ondansetron Hcl] Hives   Tramadol Nausea Only    *ANALGESICS-OPIOIDS*   Prednisone Other (See Comments)    Pt reports hyperactivity, "makes me crazy"  :  Her Current Medications Are:  Outpatient Encounter Medications as of 04/17/2023  Medication Sig   albuterol (PROVENTIL HFA;VENTOLIN HFA) 108 (90 Base) MCG/ACT inhaler Inhale 2 puffs into the lungs every 6 (six) hours as needed for wheezing or shortness of breath.    amLODipine (NORVASC) 10 MG tablet Take 10 mg by mouth daily. Patient was asked to stopped this medication, but she restarted it on her own.   amphetamine-dextroamphetamine (ADDERALL) 30 MG tablet Take 15 mg by mouth as needed. IN THE MORNING & AT NOON.   atorvastatin (LIPITOR) 10 MG tablet Take 1 tablet (10 mg total) by mouth daily. (Patient taking differently: Take 20 mg by mouth daily.)   chlorthalidone (HYGROTON) 25 MG tablet TAKE 1 TABLET (25 MG TOTAL) BY MOUTH DAILY.   fluticasone (FLONASE) 50 MCG/ACT nasal spray Place 1 spray into both nostrils daily.   isosorbide mononitrate (IMDUR) 30 MG 24 hr tablet Take 30 mg by mouth daily.   losartan (COZAAR) 100 MG tablet TAKE 1 TABLET BY MOUTH EVERY DAY   metoprolol succinate (TOPROL-XL) 100 MG 24 hr tablet TAKE 1 TABLET BY MOUTH EVERY DAY   oxyCODONE (ROXICODONE) 15 MG immediate release tablet Take 15 mg by mouth 4 (four) times daily as needed for pain.   pregabalin (LYRICA) 200 MG capsule Take 200 mg by mouth daily as needed (For pain).   sertraline (ZOLOFT)  100 MG tablet Take 100 mg by mouth daily.   buPROPion (WELLBUTRIN XL) 150 MG 24 hr tablet Take 150 mg by mouth 2 (two) times daily.  (Patient not taking: Reported on 04/17/2023)   furosemide (LASIX) 20 MG tablet Take 20 mg by mouth daily as needed for fluid. (Patient not taking: Reported on 04/17/2023)   meloxicam (MOBIC) 15 MG tablet Take 15 mg by mouth as needed. (Patient not taking: Reported on 04/17/2023)   pantoprazole (PROTONIX) 40 MG tablet Take 1 tablet (40 mg total) by mouth daily.   vitamin B-12 1000 MCG tablet Take 1 tablet (1,000 mcg total) by mouth daily. (Patient not taking: Reported on 03/13/2023)   No facility-administered encounter medications on file as of 04/17/2023.  :   Review of Systems:  Out of a complete 14 point review of systems, all are reviewed and negative with the exception of these symptoms as listed below:  Review of Systems  Neurological:        Rm 9 with daughter Victorino Dike Pt is well, reports she gets about 7-8 hrs of sleep a night. She does not wake up refreshed in the morning. She has a increase in daytime fatigue around 2-4 pm. No known or witnessed apnea. Mother has OSA    Objective:  Neurological Exam  Physical Exam Physical Examination:   Vitals:   04/17/23 1007  BP: 111/76  Pulse: 71    General Examination: The patient is a very pleasant 57 y.o. female in no acute distress. She appears well-developed and well-nourished and well groomed.   HEENT: Normocephalic, atraumatic, pupils are equal, round and reactive to light, extraocular tracking is good without limitation to gaze excursion or nystagmus noted. Hearing is grossly intact. Face is symmetric with normal facial animation. Speech is clear with no dysarthria noted. There is no hypophonia. There is no lip, neck/head, jaw or voice tremor. Neck is supple with full range of passive and active motion. There are no carotid bruits on auscultation. Oropharynx exam reveals: moderate mouth dryness,  adequate dental hygiene and mild airway crowding, due to small airway entry and redundant  soft palate, uvula thin and longish, tonsils absent.  Mallampati class II.  Neck circumference 15 three-quarter inches, minimal overbite noted.  Tongue protrudes centrally and palate elevates symmetrically.  Chest: Clear to auscultation without wheezing, rhonchi or crackles noted.  Unremarkable median sternotomy scar.  Heart: S1+S2+0, regular and normal without murmurs, rubs or gallops noted.   Abdomen: Soft, non-tender and non-distended.  Extremities: There is no pitting edema in the distal lower extremities bilaterally.   Skin: Warm and dry without trophic changes noted.   Musculoskeletal: exam reveals no obvious joint deformities.   Neurologically:  Mental status: The patient is awake, alert and oriented in all 4 spheres. Her immediate and remote memory, attention, language skills and fund of knowledge are appropriate. There is no evidence of aphasia, agnosia, apraxia or anomia. Speech is clear with normal prosody and enunciation. Thought process is linear. Mood is normal and affect is normal.  Cranial nerves II - XII are as described above under HEENT exam.  Motor exam: Normal bulk, strength and tone is noted. There is no obvious action or resting tremor.  Fine motor skills and coordination: grossly intact.  Cerebellar testing: No dysmetria or intention tremor. There is no truncal or gait ataxia.  Sensory exam: intact to light touch in the upper and lower extremities.  Gait, station and balance: She stands easily. No veering to one side is noted. No leaning to one side is noted. Posture is age-appropriate and stance is narrow based. Gait shows normal stride length and normal pace. No problems turning are noted.   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 A laboratory attended sleep study is typically considered "gold standard" for evaluation of sleep disordered breathing.   I had a long chat with the patient and her daughter about my findings and the diagnosis of sleep apnea, particularly OSA, its prognosis and treatment options. We talked about medical/conservative treatments, surgical interventions and non-pharmacological approaches for symptom control. I explained, in particular, the risks and ramifications of untreated moderate to severe OSA, especially with respect to developing cardiovascular disease down the road, including congestive heart failure (CHF), difficult to treat hypertension, cardiac arrhythmias (particularly A-fib), neurovascular complications including TIA, stroke and dementia. Even type 2 diabetes has, in part, been linked to untreated OSA. Symptoms of untreated OSA may include (but may not be limited to) daytime sleepiness, nocturia (i.e. frequent nighttime urination), memory problems, mood irritability and suboptimally controlled or worsening mood disorder such as depression and/or anxiety, lack of energy, lack of motivation, physical discomfort, as well as recurrent headaches, especially morning or nocturnal headaches. We talked about the importance of maintaining a healthy lifestyle and striving for healthy weight.  The importance of complete smoking cessation was also addressed.  In addition, we talked about the importance of striving  for and maintaining good sleep hygiene. I recommended a sleep study at this time. I outlined the differences between a laboratory attended sleep study which is considered more comprehensive  and accurate over the option of a home sleep test (HST); the latter may lead to underestimation of sleep disordered breathing in some instances and does not help with diagnosing upper airway resistance syndrome and is not accurate enough to diagnose primary central sleep apnea typically. I outlined possible surgical and non-surgical treatment options of OSA, including the use of a positive airway pressure (PAP) device (i.e. CPAP, AutoPAP/APAP or BiPAP in certain circumstances), a custom-made dental device (aka oral appliance, which would require a referral to a specialist dentist or orthodontist typically, and is generally speaking not considered for patients with full dentures or edentulous state), upper airway surgical options, such as traditional UPPP (which is not considered a first-line treatment) or the Inspire device (hypoglossal nerve stimulator, which would involve a referral for consultation with an ENT surgeon, after careful selection, following inclusion criteria - also not first-line treatment). I explained the PAP treatment option to the patient in detail, as this is generally considered first-line treatment.  The patient indicated that she would be willing to try PAP therapy, if the need arises. I explained the importance of being compliant with PAP treatment, not only for insurance purposes but primarily to improve patient's symptoms symptoms, and for the patient's long term health benefit, including to reduce Her cardiovascular risks longer-term.    We Cardenas pick up our discussion about the next steps and treatment options after testing.  We Cardenas keep her posted as to the test results by phone call and/or MyChart messaging where possible.  We Cardenas plan to follow-up in sleep clinic accordingly as well.  I  answered all their questions today and the patient and her daughter were in agreement.   I encouraged her to call with any interim questions, concerns, problems or updates or email [place] through MyChart.  Generally speaking, sleep test authorizations may take up to 2 weeks, sometimes less, sometimes longer, the patient is encouraged to get in touch with [place] if they do not hear back from the sleep lab staff directly within the next 2 weeks.  Thank you very much for allowing me to participate in the care of this nice patient. If I can be of any further assistance to you please do not hesitate to call me at [phone].  Sincerely,   Huston Foley, MD, PhD

## 2023-04-17 NOTE — Patient Instructions (Signed)

## 2023-04-18 ENCOUNTER — Ambulatory Visit: Payer: Medicare Other | Admitting: Cardiology

## 2023-04-18 ENCOUNTER — Encounter: Payer: Self-pay | Admitting: Cardiology

## 2023-04-18 VITALS — BP 140/93 | HR 75 | Ht 62.0 in | Wt 172.0 lb

## 2023-04-18 DIAGNOSIS — I10 Essential (primary) hypertension: Secondary | ICD-10-CM

## 2023-04-18 NOTE — Progress Notes (Signed)
Follow up visit  Subjective:   Amanda Cardenas, female    DOB: 1966/05/30, 57 y.o.   MRN: [id number]   HPI   Chief Complaint  Patient presents with   Hypertension    57 y.o. Caucasian female with hypertension, hyperlipidemia, solitary kidney, h/o NSTEMI with no obstructive CAD (2019)  No complaints. Home blood pressure readings are variable, but mostly elevated. Patient takes all her medications in the morning. Reviewed recent test results with the patient, details below.      Current Outpatient Medications:    albuterol (PROVENTIL HFA;VENTOLIN HFA) 108 (90 Base) MCG/ACT inhaler, Inhale 2 puffs into the lungs every 6 (six) hours as needed for wheezing or shortness of breath. , Disp: , Rfl:    amLODipine (NORVASC) 10 MG tablet, Take 10 mg by mouth daily. Patient was asked to stopped this medication, but she restarted it on her own., Disp: , Rfl:    amphetamine-dextroamphetamine (ADDERALL) 30 MG tablet, Take 15 mg by mouth as needed. IN THE MORNING & AT NOON., Disp: , Rfl:    atorvastatin (LIPITOR) 10 MG tablet, Take 1 tablet (10 mg total) by mouth daily. (Patient taking differently: Take 20 mg by mouth daily.), Disp: 90 tablet, Rfl: 3   buPROPion (WELLBUTRIN XL) 150 MG 24 hr tablet, Take 150 mg by mouth 2 (two) times daily.  (Patient not taking: Reported on 04/17/2023), Disp: , Rfl:    chlorthalidone (HYGROTON) 25 MG tablet, TAKE 1 TABLET (25 MG TOTAL) BY MOUTH DAILY., Disp: 90 tablet, Rfl: 0   fluticasone (FLONASE) 50 MCG/ACT nasal spray, Place 1 spray into both nostrils daily., Disp: , Rfl:    furosemide (LASIX) 20 MG tablet, Take 20 mg by mouth daily as needed for fluid. (Patient not taking: Reported on 04/17/2023), Disp: , Rfl:    isosorbide mononitrate (IMDUR) 30 MG 24 hr tablet, Take 30 mg by mouth daily., Disp: , Rfl: 3   losartan (COZAAR) 100 MG tablet, TAKE 1 TABLET BY MOUTH EVERY DAY, Disp: 90 tablet, Rfl: 1   meloxicam (MOBIC) 15 MG tablet, Take 15 mg by mouth as needed.  (Patient not taking: Reported on 04/17/2023), Disp: , Rfl:    metoprolol succinate (TOPROL-XL) 100 MG 24 hr tablet, TAKE 1 TABLET BY MOUTH EVERY DAY, Disp: 90 tablet, Rfl: 0   oxyCODONE (ROXICODONE) 15 MG immediate release tablet, Take 15 mg by mouth 4 (four) times daily as needed for pain., Disp: , Rfl:    pantoprazole (PROTONIX) 40 MG tablet, Take 1 tablet (40 mg total) by mouth daily., Disp: 30 tablet, Rfl: 2   pregabalin (LYRICA) 200 MG capsule, Take 200 mg by mouth daily as needed (For pain)., Disp: , Rfl:    sertraline (ZOLOFT) 100 MG tablet, Take 100 mg by mouth daily., Disp: , Rfl:    vitamin B-12 1000 MCG tablet, Take 1 tablet (1,000 mcg total) by mouth daily. (Patient not taking: Reported on 03/13/2023), Disp: 30 tablet, Rfl: 0   Cardiovascular & other pertient studies:  Reviewed external labs and tests, independently interpreted  EKG 09/08/2022: Sinus rhythm Normal EKG  Echocardiogram 03/01/2022: Hyperdynamic LV systolic function with visual EF >70%. Left ventricle cavity is normal in size. Normal left ventricular wall thickness. Normal global wall motion. Normal diastolic filling pattern, normal LAP. Mild tricuspid regurgitation. No evidence of pulmonary hypertension. RVSP measures 32 mmHg. Compared to 10/26/2017 LVEF is now hyperdynamic otherwise no significant change.  Recent labs: 04/10/2023: Glucose 118, BUN/Cr 33/1.32. EGFR 47. Na/K 146/5.0.  H/H 8/28.  MCV 77. Platelets 344 TSH 0.127 low, free T4 1.1 normal Renin/aldosterone normal  11/16/2022: Glucose 162, BUN/Cr 13/1.06. EGFR 61. Na/K 141/4.0. Rest of the CMP normal  01/2022: Chol 152, TG 131, HDL 47, LDL 82   Review of Systems  Cardiovascular:  Negative for chest pain, dyspnea on exertion, leg swelling, palpitations and syncope.         Vitals:   04/18/23 1426  BP: (!) 140/93  Pulse: 75  SpO2: 95%     Body mass index is 31.46 kg/m. Filed Weights   04/18/23 1426  Weight: 172 lb (78 kg)       Objective:   Physical Exam Vitals and nursing note reviewed.  Constitutional:      General: She is not in acute distress. Neck:     Vascular: No JVD.  Cardiovascular:     Rate and Rhythm: Normal rate and regular rhythm.     Heart sounds: Normal heart sounds. No murmur heard. Pulmonary:     Effort: Pulmonary effort is normal.     Breath sounds: Normal breath sounds. No wheezing or rales.  Musculoskeletal:     Right lower leg: No edema.     Left lower leg: No edema.             Visit diagnoses:   ICD-10-CM   1. Essential hypertension  I10 Basic metabolic panel        Orders Placed This Encounter  Procedures   Basic metabolic panel        Assessment & Recommendations:   57 y.o. Caucasian female with hypertension, hyperlipidemia, solitary kidney, h/o NSTEMI with no obstructive CAD (2019)  Hypertension: No secondary cause found. Elevated today. Blood pressure is labile. I have asked her to space out his medications and keep a log of her reading in the morning and evening. I would not make any change today, but review the log at next visit and then decide. Cr mildly elevated recently, known solitary kidney. Increase water intake. Avoid NSAIDS.   F/u in 4 weeks     Elder Negus, MD Pager: [phone] Office: [phone]

## 2023-04-19 ENCOUNTER — Encounter: Payer: Self-pay | Admitting: Cardiology

## 2023-05-02 ENCOUNTER — Telehealth: Payer: Self-pay | Admitting: Neurology

## 2023-05-02 NOTE — Telephone Encounter (Signed)
NPSG- Medicare/medicaid no auth req   [employer].

## 2023-05-16 ENCOUNTER — Encounter: Payer: Self-pay | Admitting: Obstetrics and Gynecology

## 2023-05-16 ENCOUNTER — Other Ambulatory Visit: Payer: Self-pay

## 2023-05-16 ENCOUNTER — Other Ambulatory Visit (HOSPITAL_COMMUNITY)
Admission: RE | Admit: 2023-05-16 | Discharge: 2023-05-16 | Disposition: A | Discharge status: Home / Self Care | Payer: Medicare Other | Source: Ambulatory Visit | Attending: Obstetrics and Gynecology | Admitting: Obstetrics and Gynecology

## 2023-05-16 ENCOUNTER — Ambulatory Visit: Payer: Medicare Other | Admitting: Obstetrics and Gynecology

## 2023-05-16 VITALS — BP 144/101 | HR 68 | Wt 173.7 lb

## 2023-05-16 DIAGNOSIS — K625 Hemorrhage of anus and rectum: Secondary | ICD-10-CM | POA: Insufficient documentation

## 2023-05-16 DIAGNOSIS — N907 Vulvar cyst: Secondary | ICD-10-CM

## 2023-05-16 DIAGNOSIS — K5900 Constipation, unspecified: Secondary | ICD-10-CM

## 2023-05-16 DIAGNOSIS — N939 Abnormal uterine and vaginal bleeding, unspecified: Secondary | ICD-10-CM

## 2023-05-16 DIAGNOSIS — Z860100 Personal history of colon polyps, unspecified: Secondary | ICD-10-CM | POA: Insufficient documentation

## 2023-05-16 DIAGNOSIS — Z01419 Encounter for gynecological examination (general) (routine) without abnormal findings: Secondary | ICD-10-CM

## 2023-05-16 DIAGNOSIS — Z1211 Encounter for screening for malignant neoplasm of colon: Secondary | ICD-10-CM | POA: Insufficient documentation

## 2023-05-16 HISTORY — DX: Constipation, unspecified: K59.00

## 2023-05-16 HISTORY — DX: Encounter for screening for malignant neoplasm of colon: Z12.11

## 2023-05-16 LAB — POCT URINALYSIS DIP (DEVICE)
Bilirubin Urine: NEGATIVE
Glucose, UA: NEGATIVE mg/dL
Ketones, ur: NEGATIVE mg/dL
Leukocytes,Ua: NEGATIVE
Nitrite: NEGATIVE
Protein, ur: NEGATIVE mg/dL
Specific Gravity, Urine: 1.02 (ref 1.005–1.030)
Urobilinogen, UA: 0.2 mg/dL (ref 0.0–1.0)
pH: 8 (ref 5.0–8.0)

## 2023-05-16 NOTE — Progress Notes (Signed)
NEW GYNECOLOGY PATIENT Patient name: Amanda Cardenas MRN [id number]  Date of birth: Jun 20, 1966 Chief Complaint:   Gynecologic Exam     History:  Amanda Cardenas is a 57 y.o. No obstetric history on file. being seen today for vulvar cyst, .   Was going to participate in reasearch study but got turned down due to having high markers of inflamation. Was told she had bad anemia and got 3 pints of blood but source of blood loss unknown.   Notes a small ball on her labia and has gotten bigger in the last year. Has not been sexually active for 5 years. Going to engage in sexual activity . The ball is in the middle of it.   Bleeding - spotting, like the beginning of a period. Pinkish red. No masturbation. Sometimes  has pain when she pees. No abnormal vaginal discharge   Bumps on her skin including one on her labia. Has had vaginal bleeding. Reports begin told she has inflammation in her body but source unknown and requiring blood transfusions.      Gynecologic History Patient's last menstrual period was 10/18/2011 (lmp unknown). Contraception: abstinence Last Pap: prior to hysterectomy  Last Mammogram: 04/2023 BIRADS 1   Obstetric History OB History  No obstetric history on file.    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   Gout       10/1 IMO update     H/O colonoscopy 08/2016   Hx of cardiac cath 10/25/2017   NO INTERVENTION   Hypertension    Hyperthyroidism    Migraines    Pain in left foot 05/07/2020   Screening for malignant neoplasm of colon 05/16/2023   Vitamin D deficiency     Past Surgical History:  Procedure Laterality Date   ABDOMINAL  HYSTERECTOMY     APPENDECTOMY     BIOPSY  04/15/2022   Procedure: BIOPSY;  Surgeon: Jeani Hawking, MD;  Location: [facility];  Service: Gastroenterology;;   carpal tunnel repair     ENDOVENOUS ABLATION SAPHENOUS VEIN W/ LASER Left 09/27/2017   endovenous laser ablation left greater saphenous vein and stab phlebectomy > 20 incisions left leg by Josephina Gip MD   ESOPHAGOGASTRODUODENOSCOPY (EGD) WITH PROPOFOL N/A 04/15/2022   Procedure: ESOPHAGOGASTRODUODENOSCOPY (EGD) WITH PROPOFOL;  Surgeon: Jeani Hawking, MD;  Location: [facility];  Service: Gastroenterology;  Laterality: N/A;   HIATAL HERNIA REPAIR  2000   LEFT HEART CATH AND CORONARY ANGIOGRAPHY N/A 10/25/2017   Procedure: LEFT HEART CATH AND CORONARY ANGIOGRAPHY;  Surgeon: Yates Decamp, MD;  Location: [facility];  Service: Cardiovascular;  Laterality: N/A;   MEDIASTERNOTOMY N/A 04/11/2018   Procedure: MEDIAN STERNOTOMY;  Surgeon: Loreli Slot, MD;  Location: [facility] OR;  Service: Thoracic;  Laterality: N/A;   THYMECTOMY N/A 04/11/2018   Procedure: THYMECTOMY;  Surgeon: Loreli Slot, MD;  [facility];  Service: Thoracic;  Laterality: N/A;   TONSILLECTOMY      Current Outpatient Medications on File Prior to Visit  Medication Sig Dispense Refill   albuterol (PROVENTIL HFA;VENTOLIN HFA) 108 (90 Base) MCG/ACT inhaler Inhale 2 puffs into  the lungs every 6 (six) hours as needed for wheezing or shortness of breath.      amLODipine (NORVASC) 10 MG tablet Take 10 mg by mouth daily. Patient was asked to stopped this medication, but she restarted it on her own.     amphetamine-dextroamphetamine (ADDERALL) 30 MG tablet Take 15 mg by mouth as needed. IN THE MORNING & AT NOON.     buPROPion (WELLBUTRIN XL) 150 MG 24 hr tablet Take 150 mg by mouth 2 (two) times daily.     fluticasone (FLONASE) 50 MCG/ACT nasal spray Place 1 spray into both nostrils daily.     isosorbide mononitrate (IMDUR) 30 MG 24 hr tablet Take 30 mg by mouth  daily.  3   losartan (COZAAR) 100 MG tablet TAKE 1 TABLET BY MOUTH EVERY DAY 90 tablet 1   metoprolol succinate (TOPROL-XL) 100 MG 24 hr tablet TAKE 1 TABLET BY MOUTH EVERY DAY 90 tablet 0   oxyCODONE (ROXICODONE) 15 MG immediate release tablet Take 15 mg by mouth 4 (four) times daily as needed for pain.     predniSONE (DELTASONE) 5 MG tablet Take 5 mg by mouth daily with breakfast.     pregabalin (LYRICA) 200 MG capsule Take 200 mg by mouth daily as needed (For pain).     sertraline (ZOLOFT) 100 MG tablet Take 100 mg by mouth daily.     atorvastatin (LIPITOR) 10 MG tablet Take 1 tablet (10 mg total) by mouth daily. (Patient taking differently: Take 20 mg by mouth daily.) 90 tablet 3   chlorthalidone (HYGROTON) 25 MG tablet TAKE 1 TABLET (25 MG TOTAL) BY MOUTH DAILY. 90 tablet 0   pantoprazole (PROTONIX) 40 MG tablet Take 1 tablet (40 mg total) by mouth daily. 30 tablet 2   No current facility-administered medications on file prior to visit.    Allergies  Allergen Reactions   Zofran [Ondansetron Hcl] Hives   Tramadol Nausea Only    *ANALGESICS-OPIOIDS*   Prednisone Other (See Comments)    Pt reports hyperactivity, "makes me crazy"    Social History:  reports that she quit smoking about 4 years ago. Her smoking use included e-cigarettes and cigarettes. She started smoking about 29 years ago. She has a 6.3 pack-year smoking history. She has never used smokeless tobacco. She reports that she does not drink alcohol and does not use drugs.  Family History  Problem Relation Age of Onset   Hypertension Mother    Hyperlipidemia Mother    Diabetes Mellitus II Mother    Heart disease Mother    Depression Mother    Sleep apnea Mother    Thyroid disease Cousin     The following portions of the patient's history were reviewed and updated as appropriate: allergies, current medications, past family history, past medical history, past social history, past surgical history and problem  list.  Review of Systems Pertinent items noted in HPI and remainder of comprehensive ROS otherwise negative.  Physical Exam:  BP (!) 142/103   Pulse 71   Wt 173 lb 11.2 oz (78.8 kg)   LMP 10/18/2011 (LMP Unknown)   BMI 31.77 kg/m  Physical Exam Vitals and nursing note reviewed. Exam conducted with a chaperone present.  Constitutional:      Appearance: Normal appearance.  Cardiovascular:     Rate and Rhythm: Normal rate.  Pulmonary:     Effort: Pulmonary effort is normal.     Breath sounds: Normal breath sounds.  Genitourinary:    Exam position: Lithotomy  position.     Vagina: Normal.       Comments: Firm nodule approximately 1.5 cm on upper left labia majora, nontender, nonerythematous, mobile Absent cervix and uterus No adnexal masses or tenderness Neurological:     General: No focal deficit present.     Mental Status: She is alert and oriented to person, place, and time.  Psychiatric:        Mood and Affect: Mood normal.        Behavior: Behavior normal.        Thought Content: Thought content normal.        Judgment: Judgment normal.    VULVAR BIOPSY NOTE The indications for vulvar biopsy (rule out neoplasia, establish lichen sclerosus diagnosis) were reviewed.   Risks of the biopsy including pain, bleeding, infection, inadequate specimen, scarring and need for additional procedures  were discussed. The patient stated understanding and agreed to undergo procedure today. Consent was signed,  time out performed.  Chaperone was present during entire procedure. The patient's vulva was prepped with Betadine. 1% lidocaine was injected into the left upper vulva. An 11 blade was used to make a 5mm incision and the contents and cyst wall removed using forceps. The skin was reapproximated with 2-0 vicryl in a vertical mattress suture.  The patient tolerated the procedure well. Post-procedure instructions  (pelvic rest for one week) were given to the patient. The patient is to call  with heavy bleeding, fever greater than 100.4, foul smelling vaginal discharge or other concerns.      Assessment and Plan:   1. Well woman exam with routine gynecological exam - Breast Health: Encouraged self breast awareness/SBE. Discussed limits of clinical breast exam for detecting breast cancer. Discussed importance of annual MXR. MXR is up to date: 04/2023 - Climacteric/Sexual health: Reviewed typical and atypical symptoms of menopause/peri-menopause. Discussed PMB and to call if any amount of spotting.  - Colonoscopy: Per PCP - F/U 12 months and prn   2. Vulvar cyst Status post uncomplicated vulvar cyst removal, will follow-up surgical pathology.  - Surgical pathology( / [facility])   3. Vaginal spotting No abnormalities found on speculum exam.  UA obtained to rule out hematuria.   Routine preventative health maintenance measures emphasized. Please refer to After Visit Summary for other counseling recommendations.   Follow-up: No follow-ups on file.      Lorriane Shire, MD Obstetrician & Gynecologist, [facility] [employer], [facility]

## 2023-05-17 ENCOUNTER — Ambulatory Visit: Payer: Medicare Other | Admitting: Cardiology

## 2023-05-17 LAB — SURGICAL PATHOLOGY

## 2023-05-21 ENCOUNTER — Other Ambulatory Visit: Payer: Self-pay | Admitting: Obstetrics and Gynecology

## 2023-05-21 DIAGNOSIS — R319 Hematuria, unspecified: Secondary | ICD-10-CM

## 2023-05-22 ENCOUNTER — Ambulatory Visit: Payer: Medicare Other | Admitting: Cardiology

## 2023-05-22 ENCOUNTER — Telehealth: Payer: Self-pay | Admitting: General Practice

## 2023-05-22 NOTE — Telephone Encounter (Signed)
-----   Message from Lorriane Shire sent at 05/21/2023 12:42 PM EDT ----- Regarding: Urine testing Hello,  Please contact patient and have her drop off a new urine sample for culture and cytology. She had mentioned spotting during visit and her urine showed blood but I don't see a culture from that visit. Order has been placed.  Thank you,  Ajewole

## 2023-05-22 NOTE — Telephone Encounter (Signed)
Called patient and discussed coming in for urine culture. Patient states she is currently seeing a urologist and they have been doing several tests like this. Told patient that is okay, she can follow up with them.

## 2023-05-28 ENCOUNTER — Encounter: Payer: Self-pay | Admitting: Cardiology

## 2023-05-28 ENCOUNTER — Ambulatory Visit: Payer: Medicare Other | Admitting: Cardiology

## 2023-05-28 VITALS — BP 112/82 | HR 85 | Resp 16 | Ht 62.0 in | Wt 170.0 lb

## 2023-05-28 DIAGNOSIS — R748 Abnormal levels of other serum enzymes: Secondary | ICD-10-CM | POA: Insufficient documentation

## 2023-05-28 DIAGNOSIS — I10 Essential (primary) hypertension: Secondary | ICD-10-CM

## 2023-05-28 MED ORDER — HYDRALAZINE HCL 50 MG PO TABS: 50.0000 mg | ORAL_TABLET | ORAL | 3 refills | Status: DC

## 2023-05-28 NOTE — Progress Notes (Signed)
Follow up visit  Subjective:   Amanda Cardenas, female    DOB: 1966/10/01, 57 y.o.   MRN: [id number]   HPI   Chief Complaint  Patient presents with   Essential hypertension   Follow-up    4 weeks    57 y.o. Caucasian female with hypertension, hyperlipidemia, solitary kidney, h/o NSTEMI with no obstructive CAD (2019)  Blood pressure is up and down, but average is controlled. Recently, she was found to have elevated CPK at 387, along with elevated CRP (on labs checked for a clinical trial).  On specific questioning, she reports generalized myalgias, as well as pain in fingers of both hands, as well as perceived deformities.     Current Outpatient Medications:    albuterol (PROVENTIL HFA;VENTOLIN HFA) 108 (90 Base) MCG/ACT inhaler, Inhale 2 puffs into the lungs every 6 (six) hours as needed for wheezing or shortness of breath. , Disp: , Rfl:    amLODipine (NORVASC) 10 MG tablet, Take 10 mg by mouth daily. Patient was asked to stopped this medication, but she restarted it on her own., Disp: , Rfl:    amphetamine-dextroamphetamine (ADDERALL) 20 MG tablet, Take 20 mg by mouth as needed. IN THE MORNING & AT NOON., Disp: , Rfl:    atorvastatin (LIPITOR) 10 MG tablet, Take 1 tablet (10 mg total) by mouth daily., Disp: 90 tablet, Rfl: 3   fluticasone (FLONASE) 50 MCG/ACT nasal spray, Place 1 spray into both nostrils daily., Disp: , Rfl:    isosorbide mononitrate (IMDUR) 30 MG 24 hr tablet, Take 30 mg by mouth daily., Disp: , Rfl: 3   losartan (COZAAR) 100 MG tablet, TAKE 1 TABLET BY MOUTH EVERY DAY, Disp: 90 tablet, Rfl: 1   Oxycodone HCl 10 MG TABS, Take 10 mg by mouth 4 (four) times daily as needed for pain., Disp: , Rfl:    pantoprazole (PROTONIX) 40 MG tablet, Take 1 tablet (40 mg total) by mouth daily., Disp: 30 tablet, Rfl: 2   predniSONE (DELTASONE) 5 MG tablet, Take 5 mg by mouth daily with breakfast., Disp: , Rfl:    pregabalin (LYRICA) 200 MG capsule, Take 200 mg by mouth daily as  needed (For pain)., Disp: , Rfl:    sertraline (ZOLOFT) 100 MG tablet, Take 100 mg by mouth daily., Disp: , Rfl:    chlorthalidone (HYGROTON) 25 MG tablet, TAKE 1 TABLET (25 MG TOTAL) BY MOUTH DAILY., Disp: 90 tablet, Rfl: 0   metoprolol succinate (TOPROL-XL) 100 MG 24 hr tablet, TAKE 1 TABLET BY MOUTH EVERY DAY, Disp: 90 tablet, Rfl: 0   Cardiovascular & other pertient studies:  Reviewed external labs and tests, independently interpreted  EKG 09/08/2022: Sinus rhythm Normal EKG  Echocardiogram 03/01/2022: Hyperdynamic LV systolic function with visual EF >70%. Left ventricle cavity is normal in size. Normal left ventricular wall thickness. Normal global wall motion. Normal diastolic filling pattern, normal LAP. Mild tricuspid regurgitation. No evidence of pulmonary hypertension. RVSP measures 32 mmHg. Compared to 10/26/2017 LVEF is now hyperdynamic otherwise no significant change.  Recent labs: 04/10/2023: Glucose 118, BUN/Cr 33/1.32. EGFR 47. Na/K 146/5.0.  H/H 8/28. MCV 77. Platelets 344 TSH 0.127 low, free T4 1.1 normal Renin/aldosterone normal  11/16/2022: Glucose 162, BUN/Cr 13/1.06. EGFR 61. Na/K 141/4.0. Rest of the CMP normal  01/2022: Chol 152, TG 131, HDL 47, LDL 82   Review of Systems  Cardiovascular:  Negative for chest pain, dyspnea on exertion, leg swelling, palpitations and syncope.  Musculoskeletal:  Positive for joint pain and myalgias.  Vitals:   05/28/23 1325  BP: 112/82  Pulse: 85  Resp: 16  SpO2: 96%     Body mass index is 31.09 kg/m. Filed Weights   05/28/23 1325  Weight: 170 lb (77.1 kg)      Objective:   Physical Exam Vitals and nursing note reviewed.  Constitutional:      General: She is not in acute distress. Neck:     Vascular: No JVD.  Cardiovascular:     Rate and Rhythm: Normal rate and regular rhythm.     Heart sounds: Normal heart sounds. No murmur heard. Pulmonary:     Effort: Pulmonary effort is normal.      Breath sounds: Normal breath sounds. No wheezing or rales.  Musculoskeletal:     Right lower leg: No edema.     Left lower leg: No edema.             Visit diagnoses:   ICD-10-CM   1. Essential hypertension  I10 hydrALAZINE (APRESOLINE) 50 MG tablet    2. Elevated CPK  R74.8             Assessment & Recommendations:   57 y.o. Caucasian female with hypertension, hyperlipidemia, solitary kidney, h/o NSTEMI with no obstructive CAD (2019)  Hypertension: Labile. Can stop Imdur, but added hydralazine 50 mg for prn use if SBP >160 mmHg.  Elevated CPK: Along with elevated CRP, arthralgias, and myalgias, possibly underlying rheumatological etiology. In addition to stopping statin, consider referral to rheumatology.   F/u in 4 weeks     Elder Negus, MD Pager: [phone] Office: [phone]

## 2023-07-30 ENCOUNTER — Ambulatory Visit (INDEPENDENT_AMBULATORY_CARE_PROVIDER_SITE_OTHER): Payer: Medicare Other | Admitting: Neurology

## 2023-07-30 DIAGNOSIS — Z9189 Other specified personal risk factors, not elsewhere classified: Secondary | ICD-10-CM

## 2023-07-30 DIAGNOSIS — F17200 Nicotine dependence, unspecified, uncomplicated: Secondary | ICD-10-CM

## 2023-07-30 DIAGNOSIS — G4719 Other hypersomnia: Secondary | ICD-10-CM

## 2023-07-30 DIAGNOSIS — G4733 Obstructive sleep apnea (adult) (pediatric): Secondary | ICD-10-CM

## 2023-07-30 DIAGNOSIS — Z79891 Long term (current) use of opiate analgesic: Secondary | ICD-10-CM

## 2023-07-30 DIAGNOSIS — Z820 Family history of epilepsy and other diseases of the nervous system: Secondary | ICD-10-CM

## 2023-07-30 DIAGNOSIS — G4720 Circadian rhythm sleep disorder, unspecified type: Secondary | ICD-10-CM

## 2023-07-30 DIAGNOSIS — E66811 Obesity, class 1: Secondary | ICD-10-CM

## 2023-07-30 DIAGNOSIS — R0683 Snoring: Secondary | ICD-10-CM

## 2023-07-31 NOTE — Procedures (Signed)
Physician Interpretation:     [facility] POLYSOMNOGRAPHY  INTERPRETATION REPORT   STUDY DATE:  07/30/2023     PATIENT NAME:  Amanda Cardenas         DATE OF BIRTH:  03-27-1966  PATIENT ID:  [id number]    TYPE OF STUDY:  PSG  READING PHYSICIAN: Huston Foley, MD, PhD   SCORING TECHNICIAN: Margaretann Loveless, RPSGT     Referred by: Dr. Truett Mainland ? 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 Her Epworth sleepiness score is 13 out of 24, fatigue severity score is 50 out of 63. Height: 62 in Weight: 170 lb (BMI 31) Neck Size: 16 in    MEDICATIONS: Proventil, Norvasc, Adderall, Lipitor, Hygroton, Flonase, Imdur, Cozaar, Oxycodone, Lyrica, Zoloft, Lasix, Mobic, Protonix, Vitamin B 12   TECHNICAL DESCRIPTION: A registered sleep technologist was in attendance for the duration of the recording.  Data collection, scoring, video monitoring, and reporting were performed in compliance with the AASM Manual for the Scoring of Sleep and Associated Events; (Hypopnea is scored based on the criteria listed in Section VIII D. 1b in the AASM Manual V2.6 using a 4% oxygen desaturation rule or Hypopnea is scored based on the criteria listed in Section VIII D. 1a in the AASM Manual V2.6 using 3% oxygen desaturation and /or arousal rule).   SLEEP CONTINUITY AND SLEEP ARCHITECTURE:  Lights-out was at 21:58: and lights-on at  05:20:, with a total recording time of 7 hours, 22.5 min. Total sleep time ( TST) was 223.0 minutes with a decreased sleep efficiency at 50.4%. There was  8.7% REM sleep.    BODY POSITION:  TST was divided   between the following sleep positions: 39.9% supine;  60.1% lateral;  0% prone. Duration of total sleep and percent of total sleep in their respective position is as follows: supine 89 minutes (40%), non-supine 134 minutes (60%); right 134 minutes (60%), left 00 minutes (0%), and prone 00 minutes (0%).  Total supine REM sleep time was 00 minutes (0% of total REM sleep).  Sleep latency was markedly increased at 100.5 minutes.  REM sleep latency was markedly increased at 316.0 minutes. Of the total sleep time, the percentage of stage N1 sleep was 5.8%, stage N2 sleep was 84%, which is markedly increased, stage N3 sleep was 1.6%, which is severely reduced, and REM sleep was 8.7%, which is significantly reduced. Wake after sleep onset (WASO) time accounted for 119 minutes with one long period of wakefulness and otherwise minimal to mild sleep fragmentation noted.   RESPIRATORY MONITORING:   Based on CMS criteria (using a 4% oxygen desaturation rule for scoring hypopneas), there were 0 apneas (0 obstructive; 0 central; 0 mixed), and 42 hypopneas.  Apnea index was 0.0. Hypopnea index was 11.3. The apnea-hypopnea index was 11.3/hour overall (18.2/hour supine, 0 non-supine; 0.0 REM, 0.0 supine REM).  There were 0 respiratory effort-related arousals (RERAs).  The RERA index was 0 events/h. Total respiratory disturbance index (RDI) was 11.3 events/h. RDI results showed: supine RDI  18.2 /h; non-supine RDI 6.7 /h; REM RDI 0.0 /h, supine REM RDI 0.0 /h.   Based on AASM criteria (using a 3% oxygen desaturation and /or arousal rule for scoring hypopneas), there  were 0 apneas (0 obstructive; 0 central; 0 mixed), and 42 hypopneas. Apnea index was 0.0. Hypopnea index was 11.3. The apnea-hypopnea index was 11.3 overall (18.2 supine, 0 non-supine; 0.0 REM, 0.0 supine REM).  There were 0 respiratory effort-related arousals (RERAs).  The RERA index was 0 events/h. Total respiratory disturbance index (RDI) was 11.3 events/h.  RDI results showed: supine RDI  18.2 /h; non-supine RDI 6.7 /h; REM RDI 0.0 /h, supine REM RDI 0.0 /h.    OXIMETRY: Oxyhemoglobin Saturation Nadir during sleep was at  65%) from a mean of 90%.  Of the Total sleep time (TST)   hypoxemia (=<88%) was present for  30.1 minutes, or 13.5% of total sleep time. This is overestimated, due to O2 sensor errors.   LIMB MOVEMENTS: There were 0 periodic limb movements of sleep (0.0/hr), of which 0 (0.0/hr) were associated with an arousal.   AROUSAL: There were 97 arousals in total, for an arousal index of 26 arousals/hour.  Of these, 16 were identified as respiratory-related arousals (4 /h), 0 were PLM-related arousals (0 /h), and 96 were non-specific arousals (26 /h).    EEG: Review of the EEG showed no abnormal electrical discharges and symmetrical bihemispheric findings.    EKG: The EKG revealed normal sinus rhythm (NSR). The average heart rate during sleep was 62 bpm.  AUDIO/VIDEO REVIEW: The audio and video review did not show any abnormal or unusual behaviors, movements, phonations or vocalizations. The patient took 1 restroom breaks. Snoring was noted, ranging from mild to louder.  POST-STUDY QUESTIONNAIRE: Post study, the patient indicated, that sleep was the same as usual.   IMPRESSION:  1. Mild Obstructive Sleep Apnea (OSA) 2. Dysfunctions associated with sleep stages or arousal from sleep  RECOMMENDATIONS:  1. This study demonstrates overall mild obstructive sleep apnea, more pronounced during supine sleep with a total AHI of 11.3/h, supine AHI of 18.2/h, O2 nadir 83%.  The study was limited secondary to reduced sleep efficiency and reduced REM sleep, absence of supine REM sleep. This may lead to some degree of underestimation of her sleep disordered breathing. The O2 sensor dislodging caused errors in the O2 registration and resulted likely in some overestimation of hypoxemia. Given the patient's medical history and sleep related complaints,  treatment with positive airway pressure is recommended; this can be achieved in the form of autoPAP. A full-night CPAP titration study would allow optimization of therapy if needed, down the road. Other treatment options may include avoidance of supine sleep position along with weight loss, or the use of an oral appliance in selected patients. Please note, that untreated obstructive sleep apnea may carry additional perioperative morbidity. Patients with significant obstructive sleep apnea should receive perioperative PAP therapy and the surgeons and particularly the anesthesiologist should be informed of the diagnosis and the severity of the sleep disordered breathing. 2. This study shows sleep fragmentation and abnormal sleep stage percentages; these are nonspecific findings and per se do not signify an intrinsic sleep disorder or a cause for the patient's sleep-related symptoms. Causes include (but are not limited to) the first night effect of the sleep study, circadian rhythm disturbances, medication effect or an underlying mood disorder or medical problem.  3. The patient should be cautioned not to drive, work at heights, or operate dangerous or heavy equipment when tired or sleepy. Review and reiteration of good sleep hygiene measures should be pursued with any patient. 4. The patient will be seen in follow-up by Dr. Frances Furbish at [facility] for discussion of  the test results and further management strategies. The referring provider will be notified of the test results. I certify that I have reviewed the entire raw data recording prior to the issuance of this report in accordance with the Standards of Accreditation of the American Academy of Sleep Medicine (AASM).  Huston Foley, MD, PhD Medical Director, [facility]) Diplomat, ABPN (Neurology and Sleep)             Technical Report:   General Information  Name: Amanda Cardenas, Amanda Cardenas BMI: 31.09 Physician: Huston Foley, MD   ID: [id number] Height: 62.0 in Technician: Margaretann Loveless, RPSGT  Sex: Female Weight: 170.0 lb Record: [id number]  Age: 57 [May 16, 1966] Date: 07/30/2023    Medical & Medication History    57 year old female with an underlying medical history of reflux disease, hypertension, thyroid disease, solitary kidney, chronic pain on chronic narcotic pain medication, 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 Of note, she is on multiple potentially sedating medications including oxycodone 15 mg 4 times a day as needed, she usually ends up taking 2 to 3/day. She takes high-dose Lyrica 200 mg daily as needed but reports that she does not take it daily, maybe 2-3 times a week. She is on sertraline 100 mg daily, this was reduced from 200 mg daily recently per PCP. She also takes a beta-blocker, she takes Adderall 30 mg strength half a pill p.o for ADHD  Proventil, Norvasc, Adderall, Lipitor, Hygroton, Flonase, Imdur, Cozaar, Oxycodone, Lyrica, Zoloft, Lasix, Mobic, Protonix, Vitamin B 12   Sleep Disorder      Comments   The patient came into the lab for a PSG. The patient took Oxycodone at 1:31 am for pain. The patient had one restroom break. EKG kept in NSR. Moderate to loud snoring. All sleep stages witnessed. Respiratory events scored with a 4% desat. Slept lateral and supine. AHI was 11.2 after 2 hrs of TST. The patient had alpha intrusion. Majority of respiratory events while supine.    Lights out: 09:58:14 PM Lights on: 05:20:48 AM   Time Total Supine Side Prone Upright  Recording (TRT) 7h 22.25m 2h 9.74m 5h 5.65m 0h 8.55m 0h 0.23m  Sleep (TST) 3h 43.51m 1h 29.74m 2h 14.68m 0h 0.21m 0h 0.58m   Latency N1 N2 N3 REM Onset Per. Slp. Eff.  Actual 0h 0.42m 0h 0.74m 3h 58.31m 5h 16.39m 1h 40.28m 4h 11.70m 50.40%   Stg Dur Wake N1 N2 N3 REM  Total 219.5 13.0 187.0 3.5  19.5  Supine 40.5 0.5 85.0 3.5 0.0  Side 171.0 12.5 102.0 0.0 19.5  Prone 8.0 0.0 0.0 0.0 0.0  Upright 0.0 0.0 0.0 0.0 0.0   Stg % Wake N1 N2 N3 REM  Total 49.6 5.8 83.9 1.6 8.7  Supine 9.2 0.2 38.1 1.6 0.0  Side 38.6 5.6 45.7 0.0 8.7  Prone 1.8 0.0 0.0 0.0 0.0  Upright 0.0 0.0 0.0 0.0 0.0     Apnea Summary Sub Supine Side Prone Upright  Total 0 Total 0 0 0 0 0    REM 0 0 0 0 0    NREM 0 0 0 0 0  Obs 0 REM 0 0 0 0 0    NREM 0 0 0 0 0  Mix 0 REM 0 0 0 0 0    NREM 0 0 0 0 0  Cen 0 REM 0 0 0  0 0    NREM 0 0 0 0 0   Rera Summary Sub Supine Side Prone Upright  Total 0 Total 0 0 0 0 0    REM 0 0 0 0 0    NREM 0 0 0 0 0   Hypopnea Summary Sub Supine Side Prone Upright  Total 42 Total 42 27 15 0 0    REM 0 0 0 0 0    NREM 42 27 15 0 0   4% Hypopnea Summary Sub Supine Side Prone Upright  Total (4%) 42 Total 42 27 15 0 0    REM 0 0 0 0 0    NREM 42 27 15 0 0     AHI Total Obs Mix Cen  11.30 Apnea 0.00 0.00 0.00 0.00   Hypopnea 11.30 -- -- --  11.30 Hypopnea (4%) 11.30 -- -- --    Total Supine Side Prone Upright  Position AHI 11.30 18.20 6.72 0.00 0.00  REM AHI 0.00   NREM AHI 12.38   Position RDI 11.30 18.20 6.72 0.00 0.00  REM RDI 0.00   NREM RDI 12.38    4% Hypopnea Total Supine Side Prone Upright  Position AHI (4%) 11.30 18.20 6.72 0.00 0.00  REM AHI (4%) 0.00   NREM AHI (4%) 12.38   Position RDI (4%) 11.30 18.20 6.72 0.00 0.00  REM RDI (4%) 0.00   NREM RDI (4%) 12.38    Desaturation Information Threshold: 2% <100% <90% <80% <70% <60% <50% <40%  Supine 95.0 80.0 1.0 0.0 0.0 0.0 0.0  Side 162.0 100.0 1.0 0.0 0.0 0.0 0.0  Prone 2.0 0.0 0.0 0.0 0.0 0.0 0.0  Upright 0.0 0.0 0.0 0.0 0.0 0.0 0.0  Total 259.0 180.0 2.0 0.0 0.0 0.0 0.0  Index 46.3 32.2 0.4 0.0 0.0 0.0 0.0   Threshold: 3% <100% <90% <80% <70% <60% <50% <40%  Supine 59.0 55.0 1.0 0.0 0.0 0.0 0.0  Side 67.0 54.0 1.0 0.0 0.0 0.0 0.0  Prone 1.0 0.0 0.0 0.0 0.0 0.0 0.0  Upright 0.0 0.0 0.0 0.0 0.0  0.0 0.0  Total 127.0 109.0 2.0 0.0 0.0 0.0 0.0  Index 22.7 19.5 0.4 0.0 0.0 0.0 0.0   Threshold: 4% <100% <90% <80% <70% <60% <50% <40%  Supine 45.0 43.0 1.0 0.0 0.0 0.0 0.0  Side 44.0 38.0 1.0 0.0 0.0 0.0 0.0  Prone 0.0 0.0 0.0 0.0 0.0 0.0 0.0  Upright 0.0 0.0 0.0 0.0 0.0 0.0 0.0  Total 89.0 81.0 2.0 0.0 0.0 0.0 0.0  Index 15.9 14.5 0.4 0.0 0.0 0.0 0.0   Threshold: 3% <100% <90% <80% <70% <60% <50% <40%  Supine 59 55 1 0 0 0 0  Side 67 54 1 0 0 0 0  Prone 1 0 0 0 0 0 0  Upright 0 0 0 0 0 0 0  Total 127 109 2 0 0 0 0   Awakening/Arousal Information # of Awakenings 24  Wake after sleep onset 119.7m  Wake after persistent sleep 23.16m   Arousal Assoc. Arousals Index  Apneas 0 0.0  Hypopneas 16 4.3  Leg Movements 0 0.0  Snore 0 0.0  PTT Arousals 0 0.0  Spontaneous 97 26.1  Total 113 30.4  Leg Movement Information PLMS LMs Index  Total LMs during PLMS 0 0.0  LMs w/ Microarousals 0 0.0   LM LMs Index  w/ Microarousal 0 0.0  w/ Awakening 0 0.0  w/ Resp Event 0 0.0  Spontaneous  1 0.3  Total 1 0.3     Desaturation threshold setting: 3% Minimum desaturation setting: 10 seconds SaO2 nadir: 60% The longest event was a 70 sec obstructive Hypopnea with a minimum SaO2 of 88%. The lowest SaO2 was 83% associated with a 37 sec obstructive Hypopnea. EKG Rates EKG Avg Max Min  Awake 65 101 58  Asleep 62 72 54  EKG Events: Tachycardia

## 2023-07-31 NOTE — Addendum Note (Signed)
Addended by: Huston Foley on: 07/31/2023 07:34 PM   Modules accepted: Orders

## 2023-08-02 ENCOUNTER — Telehealth: Payer: Self-pay | Admitting: *Deleted

## 2023-08-02 NOTE — Telephone Encounter (Signed)
[facility] confirmed receipt of order.  

## 2023-08-02 NOTE — Telephone Encounter (Signed)
-----   Message from Huston Foley sent at 07/31/2023  7:34 PM EDT ----- Patient referred by Dr. Rosemary Holms, seen by me on 04/17/23, diagnostic PSG on 07/30/23.    Please call and notify the patient that the recent sleep showed obstructive sleep apnea. OSA is overall mild, but also did not sleep very well, achieved mostly light stage sleep. I recommend treatment in the form of autoPAP at home, which means, that we don't have to bring her back for a second sleep study with CPAP, but will let him try an autoPAP machine at home, through a DME company (of her choice, or as per insurance requirement). The DME representative will educate her on how to use the machine, how to put the mask on, etc. I have placed an order in the chart. Please send referral, talk to patient, send report to referring MD. We will need a FU in sleep clinic for 10 weeks post-PAP set up, please arrange that with me or one of our NPs. Thanks,   Huston Foley, MD, PhD [facility])

## 2023-08-02 NOTE — Telephone Encounter (Signed)
Spoke with patient and discussed her sleep study results, the patient verbalized understanding and is amenable to trying AutoPap at home.  We discussed the next steps on how to obtain a machine and we discussed the insurance compliance requirements which includes using the machine at least 4 hours at night and also being seen in our office for an initial follow-up appointment between 30 and 90 days after set up.  The patient scheduled her follow-up for October 29 2023 at 12:45 PM arrival 12:15 PM.  Patient is amenable to using Advil care and will watch for a call from them within a week.  Referral for autopap sent to [facility]. Sleep study result sent to referring provider Dr Rosemary Holms.

## 2023-08-10 ENCOUNTER — Other Ambulatory Visit: Payer: Self-pay | Admitting: Internal Medicine

## 2023-08-10 ENCOUNTER — Other Ambulatory Visit: Payer: Self-pay | Admitting: Cardiology

## 2023-08-10 DIAGNOSIS — I10 Essential (primary) hypertension: Secondary | ICD-10-CM

## 2023-08-10 MED ORDER — CHLORTHALIDONE 25 MG PO TABS: 25.0000 mg | ORAL_TABLET | Freq: Every day | ORAL | 2 refills | Status: DC

## 2023-09-07 ENCOUNTER — Ambulatory Visit: Payer: Medicare Other | Admitting: Cardiology

## 2023-09-11 ENCOUNTER — Other Ambulatory Visit: Payer: Self-pay

## 2023-09-11 ENCOUNTER — Encounter (HOSPITAL_COMMUNITY): Payer: Self-pay

## 2023-09-11 ENCOUNTER — Emergency Department (HOSPITAL_COMMUNITY): Payer: Medicare Other

## 2023-09-11 ENCOUNTER — Emergency Department (HOSPITAL_COMMUNITY)
Admission: EM | Admit: 2023-09-11 | Discharge: 2023-09-11 | Discharge status: Against Medical Advice | Payer: Medicare Other | Attending: Emergency Medicine | Admitting: Emergency Medicine

## 2023-09-11 DIAGNOSIS — R112 Nausea with vomiting, unspecified: Secondary | ICD-10-CM | POA: Diagnosis not present

## 2023-09-11 DIAGNOSIS — Z5321 Procedure and treatment not carried out due to patient leaving prior to being seen by health care provider: Secondary | ICD-10-CM | POA: Diagnosis not present

## 2023-09-11 DIAGNOSIS — R079 Chest pain, unspecified: Secondary | ICD-10-CM | POA: Insufficient documentation

## 2023-09-11 LAB — BASIC METABOLIC PANEL
Anion gap: 10 (ref 5–15)
BUN: 15 mg/dL (ref 6–20)
CO2: 28 mmol/L (ref 22–32)
Calcium: 9.5 mg/dL (ref 8.9–10.3)
Chloride: 100 mmol/L (ref 98–111)
Creatinine, Ser: 0.83 mg/dL (ref 0.44–1.00)
GFR, Estimated: >60 mL/min (ref >60)
Glucose, Bld: 116 mg/dL — ABNORMAL HIGH (ref 70–99)
Potassium: 3.5 mmol/L (ref 3.5–5.1)
Sodium: 138 mmol/L (ref 135–145)

## 2023-09-11 LAB — CBC
HCT: 45.9 % (ref 36.0–46.0)
Hemoglobin: 15.2 g/dL — ABNORMAL HIGH (ref 12.0–15.0)
MCH: 30.5 pg (ref 26.0–34.0)
MCHC: 33.1 g/dL (ref 30.0–36.0)
MCV: 92 fL (ref 80.0–100.0)
Platelets: 248 10*3/uL (ref 150–400)
RBC: 4.99 MIL/uL (ref 3.87–5.11)
RDW: 13 % (ref 11.5–15.5)
WBC: 10.6 10*3/uL — ABNORMAL HIGH (ref 4.0–10.5)
nRBC: 0 % (ref 0.0–0.2)

## 2023-09-11 LAB — TROPONIN I (HIGH SENSITIVITY): Troponin I (High Sensitivity): 6 ng/L (ref <18)

## 2023-09-11 NOTE — ED Provider Triage Note (Signed)
Emergency Medicine Provider Triage Evaluation Note  Amanda Cardenas , a 57 y.o. female  was evaluated in triage.  Pt complains of chest pain, now resolved. Associated with nausea and vomiting. Began this morning.  Review of Systems  Positive: Chest pain, vomiting Negative: Diaphoresis, shortness of breath, syncope, leg swelling  Physical Exam  BP (!) 121/90 (BP Location: Left Arm)   Pulse 66   Temp 98.4 F (36.9 C) (Oral)   Resp 16   LMP 10/18/2011 (LMP Unknown)   SpO2 96%  Gen:   Awake, no distress   Resp:  Normal effort  MSK:   Moves extremities without difficulty  Other:    Medical Decision Making  Medically screening exam initiated at 7:48 AM.  Appropriate orders placed.  Amanda Cardenas was informed that the remainder of the evaluation will be completed by another provider, this initial triage assessment does not replace that evaluation, and the importance of remaining in the ED until their evaluation is complete.     Lonell Grandchild, MD 09/11/23 [phone]

## 2023-09-11 NOTE — ED Notes (Signed)
Pt left stated she has a dr appointment on Thursday and she'll wait until then if feel worse will come back

## 2023-09-11 NOTE — ED Triage Notes (Signed)
Patient arrived by [facility] and was awakened this am at 0530 with cp and nausea. Patient took her own BP na found to be hypertensive. She did not have asa and took alka seltzer plus and BP med. No pain, no headache and BP normal on arrival

## 2023-10-24 NOTE — Progress Notes (Deleted)
[facility] [address] [place].  [address] [phone]       OFFICE FOLLOW UP NOTE  Ms. Silas Flood Date of Birth:  1966-01-21 Medical Record Number:  [id number]    Primary neurologist: Dr. Frances Furbish Reason for visit: Initial CPAP follow-up  Virtual Visit via Video Note  I connected with Haylen Fils on 10/29/23 at 12:45 PM EST by a video enabled telemedicine application and verified that I am speaking with the correct person using two identifiers.  Location: Patient: *** Provider: at home   I discussed the limitations of evaluation and management by telemedicine and the availability of in person appointments. The patient expressed understanding and agreed to proceed.   SUBJECTIVE:   Follow-up visit:  Prior visit: 04/17/2023 with Dr. Frances Furbish (initial consult visit)  Brief HPI:   Laporsha Grealish is a 58 y.o. female with PMH significant for HTN, thyroid disease, solitary kidney, chronic pain on chronic narcotics and medication nonobstructive CAD with history of NSTEMI, migraine headaches and mild obesity who was evaluated by Dr. Frances Furbish on 04/17/2023 for concern of underlying sleep apnea with complaints of snoring and excessive daytime somnolence.  ESS 13/24.  Sleep study 07/2023 showed overall mild OSA more pronounced during supine sleep with total AHI 11.3/h, supine AHI 18.2/h and O2 nadir of 83%.  Noted limited study secondary to reduced sleep efficiency and reduced REM sleep possibly underestimating sleep disordered breathing.  AutoPap initiated 08/21/2023.       Interval history:  Patient is being seen for initial CPAP compliance visit.         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   Gout       10/1 IMO update     H/O colonoscopy 08/2016   Hx of cardiac cath 10/25/2017   NO INTERVENTION   Hypertension    Hyperthyroidism    Migraines    Pain in left foot 05/07/2020   Screening for malignant neoplasm of colon 05/16/2023   Vitamin D deficiency     PSH:  Past Surgical History:  Procedure Laterality Date   ABDOMINAL HYSTERECTOMY     APPENDECTOMY     BIOPSY  04/15/2022   Procedure: BIOPSY;  Surgeon: Jeani Hawking, MD;  Location: [facility];  Service: Gastroenterology;;   carpal tunnel repair     ENDOVENOUS ABLATION SAPHENOUS VEIN W/ LASER Left 09/27/2017   endovenous laser ablation left greater saphenous vein and stab phlebectomy > 20 incisions left leg by Josephina Gip MD   ESOPHAGOGASTRODUODENOSCOPY (EGD) WITH PROPOFOL N/A 04/15/2022   Procedure: ESOPHAGOGASTRODUODENOSCOPY (EGD) WITH PROPOFOL;  Surgeon: Jeani Hawking, MD;  Location: [facility];  Service: Gastroenterology;  Laterality: N/A;   HIATAL HERNIA REPAIR  2000   LEFT HEART CATH AND CORONARY ANGIOGRAPHY N/A 10/25/2017   Procedure: LEFT HEART CATH AND CORONARY ANGIOGRAPHY;  Surgeon: Yates Decamp, MD;  Location: [facility];  Service: Cardiovascular;  Laterality: N/A;   MEDIASTERNOTOMY N/A 04/11/2018   Procedure: MEDIAN STERNOTOMY;  Surgeon: Charlett Lango  C, MD;  Location: [facility] OR;  Service: Thoracic;  Laterality: N/A;   THYMECTOMY N/A 04/11/2018   Procedure: THYMECTOMY;  Surgeon: Loreli Slot, MD;  Location: [facility] OR;  Service: Thoracic;  Laterality: N/A;   TONSILLECTOMY      Social History:  Social History   Socioeconomic History   Marital status: Single    Spouse name: Not on file   Number of children: 4   Years of education: Not on file   Highest education level: Not on file  Occupational History    Occupation: unemployed  Tobacco Use   Smoking status: Former    Current packs/day: 0.00    Average packs/day: 0.3 packs/day for 25.0 years (6.3 ttl pk-yrs)    Types: E-cigarettes, Cigarettes    Start date: 05/1994    Quit date: 05/2019    Years since quitting: 4.4   Smokeless tobacco: Never   Tobacco comments:    trying to quit, 3 days/week patient uses vape about 3 times a day when craving nicotine   Vaping Use   Vaping status: Former   Quit date: 07/02/2022   Substances: Nicotine  Substance and Sexual Activity   Alcohol use: No   Drug use: No   Sexual activity: Not Currently  Other Topics Concern   Not on file  Social History Narrative   Not on file   Social Drivers of Health   Financial Resource Strain: Medium Risk (08/07/2021)   Received from [facility], [facility]   Overall Financial Resource Strain (CARDIA)    Difficulty of Paying Living Expenses: Somewhat hard  Food Insecurity: No Food Insecurity (11/17/2021)   Received from [facility], [facility]   Hunger Vital Sign    Worried About Running Out of Food in the Last Year: Never true    Ran Out of Food in the Last Year: Never true  Transportation Needs: No Transportation Needs (08/07/2021)   Received from [facility], [facility]   PRAPARE - Transportation    Lack of Transportation (Medical): No    Lack of Transportation (Non-Medical): No  Physical Activity: Inactive (08/07/2021)   Received from [facility], [facility]   Exercise Vital Sign    Days of Exercise per Week: 0 days    Minutes of Exercise per Session: 0 min  Stress: Stress Concern Present (08/07/2021)   Received from [facility], [facility] - Occupational Stress Questionnaire    Feeling of Stress : Very much  Social Connections: Unknown (02/02/2022)   Received from [facility], [facility]   Social Network    Social Network: Not on file  Intimate Partner Violence: Unknown (01/02/2022)    Received from [facility], [facility]   HITS    Physically Hurt: Not on file    Insult or Talk Down To: Not on file    Threaten Physical Harm: Not on file    Scream or Curse: Not on file    Family History:  Family History  Problem Relation Age of Onset   Hypertension Mother    Hyperlipidemia Mother    Diabetes Mellitus II Mother    Heart disease Mother    Depression Mother    Sleep apnea Mother    Thyroid disease Cousin     Medications:   Current Outpatient Medications on File Prior to Visit  Medication Sig Dispense Refill   albuterol (PROVENTIL HFA;VENTOLIN HFA) 108 (90 Base) MCG/ACT inhaler Inhale 2 puffs into the lungs every  6 (six) hours as needed for wheezing or shortness of breath.      amLODipine (NORVASC) 10 MG tablet Take 10 mg by mouth daily. Patient was asked to stopped this medication, but she restarted it on her own.     amphetamine-dextroamphetamine (ADDERALL) 20 MG tablet Take 20 mg by mouth as needed. IN THE MORNING & AT NOON.     chlorthalidone (HYGROTON) 25 MG tablet Take 1 tablet (25 mg total) by mouth daily. 90 tablet 2   fluticasone (FLONASE) 50 MCG/ACT nasal spray Place 1 spray into both nostrils daily.     hydrALAZINE (APRESOLINE) 50 MG tablet Take 1 tablet (50 mg total) by mouth as directed. If SBP>160 mmHg 90 tablet 3   losartan (COZAAR) 100 MG tablet TAKE 1 TABLET BY MOUTH EVERY DAY 90 tablet 3   metoprolol succinate (TOPROL-XL) 100 MG 24 hr tablet TAKE 1 TABLET BY MOUTH EVERY DAY 90 tablet 0   Oxycodone HCl 10 MG TABS Take 10 mg by mouth 4 (four) times daily as needed for pain.     pantoprazole (PROTONIX) 40 MG tablet Take 1 tablet (40 mg total) by mouth daily. 30 tablet 2   predniSONE (DELTASONE) 5 MG tablet Take 5 mg by mouth daily with breakfast.     pregabalin (LYRICA) 200 MG capsule Take 200 mg by mouth daily as needed (For pain).     sertraline (ZOLOFT) 100 MG tablet Take 100 mg by mouth daily.     No current facility-administered  medications on file prior to visit.    Allergies:   Allergies  Allergen Reactions   Zofran [Ondansetron Hcl] Hives   Tramadol Nausea Only    *ANALGESICS-OPIOIDS*   Prednisone Other (See Comments)    Pt reports hyperactivity, "makes me crazy"      OBJECTIVE:  Physical Exam  There were no vitals filed for this visit. There is no height or weight on file to calculate BMI. No results found.   General: well developed, well nourished, seated, in no evident distress Head: head normocephalic and atraumatic.   Neck: supple with no carotid or supraclavicular bruits Cardiovascular: regular rate and rhythm, no murmurs Musculoskeletal: no deformity Skin:  no rash/petichiae Vascular:  Normal pulses all extremities   Neurologic Exam Mental Status: Awake and fully alert. Oriented to place and time. Recent and remote memory intact. Attention span, concentration and fund of knowledge appropriate. Mood and affect appropriate.  Cranial Nerves: Pupils equal, briskly reactive to light. Extraocular movements full without nystagmus. Visual fields full to confrontation. Hearing intact. Facial sensation intact. Face, tongue, palate moves normally and symmetrically.  Motor: Normal bulk and tone. Normal strength in all tested extremity muscles Sensory.: intact to touch , pinprick , position and vibratory sensation.  Coordination: Rapid alternating movements normal in all extremities. Finger-to-nose and heel-to-shin performed accurately bilaterally. Gait and Station: Arises from chair without difficulty. Stance is normal. Gait demonstrates normal stride length and balance without use of AD. Tandem walk and heel toe without difficulty.  Reflexes: 1+ and symmetric. Toes downgoing.         ASSESSMENT/PLAN: Jamacia Jester is a 58 y.o. year old female    OSA on CPAP : Compliance report shows satisfactory usage with optimal residual AHI.  Discussed continued nightly usage with ensuring greater than 4  hours nightly for optimal benefit and per insurance purposes.  Continue to follow with DME company for any needed supplies or CPAP related concerns     Follow up in *** or  call earlier if needed   CC:  PCP: Paschal Dopp, PA    I spent *** minutes of face-to-face and non-face-to-face time with patient.  This included previsit chart review, lab review, study review, order entry, electronic health record documentation, patient education and discussion regarding above diagnoses and treatment plan and answered all other questions to patient's satisfaction    Ihor Austin, [facility]  [facility] [address] [place], [place] [address]  Phone [phone] Fax [phone] Note: This document was prepared with digital dictation and possible smart phrase technology. Any transcriptional errors that result from this process are unintentional.

## 2023-10-29 ENCOUNTER — Telehealth: Payer: Medicare Other | Admitting: Adult Health

## 2023-11-15 ENCOUNTER — Other Ambulatory Visit: Payer: Self-pay | Admitting: Cardiology

## 2023-11-15 ENCOUNTER — Other Ambulatory Visit: Payer: Self-pay | Admitting: Internal Medicine

## 2023-11-15 DIAGNOSIS — I10 Essential (primary) hypertension: Secondary | ICD-10-CM

## 2023-11-16 MED ORDER — CHLORTHALIDONE 25 MG PO TABS: 25.0000 mg | ORAL_TABLET | Freq: Every day | ORAL | 2 refills | Status: DC

## 2023-11-16 MED ORDER — METOPROLOL SUCCINATE ER 100 MG PO TB24: 100.0000 mg | ORAL_TABLET | Freq: Every day | ORAL | 2 refills | Status: DC

## 2023-11-16 MED ORDER — HYDRALAZINE HCL 50 MG PO TABS: 50.0000 mg | ORAL_TABLET | ORAL | 2 refills | Status: DC

## 2023-11-28 ENCOUNTER — Ambulatory Visit: Payer: Medicare Other | Admitting: Cardiology

## 2023-12-31 ENCOUNTER — Encounter: Payer: Self-pay | Admitting: Cardiology

## 2023-12-31 ENCOUNTER — Ambulatory Visit: Payer: Medicare Other | Attending: Cardiology | Admitting: Cardiology

## 2023-12-31 VITALS — BP 102/62 | HR 58 | Resp 16 | Ht 62.0 in | Wt 176.0 lb

## 2023-12-31 DIAGNOSIS — I10 Essential (primary) hypertension: Secondary | ICD-10-CM | POA: Insufficient documentation

## 2023-12-31 DIAGNOSIS — F17210 Nicotine dependence, cigarettes, uncomplicated: Secondary | ICD-10-CM | POA: Insufficient documentation

## 2023-12-31 DIAGNOSIS — E782 Mixed hyperlipidemia: Secondary | ICD-10-CM | POA: Diagnosis present

## 2023-12-31 MED ORDER — CHLORTHALIDONE 25 MG PO TABS: 12.5000 mg | ORAL_TABLET | Freq: Every day | ORAL | 2 refills | Status: DC

## 2023-12-31 NOTE — Progress Notes (Addendum)
 Cardiology Office Note:  .   Date:  12/31/2023  ID:  [name], [name] 09-17-1966, MRN [id number] PCP: Paschal Dopp, PA  [facility] Providers Cardiologist:  Truett Mainland, MD PCP: Paschal Dopp, PA  Chief Complaint  Patient presents with   Essential hypertension   Follow-up    6 months     Amanda Cardenas is a 58 y.o. female with hypertension, hyperlipidemia, solitary kidney, h/o NSTEMI with no obstructive CAD (2019)   Blood pressure is running lower than usual.  She reports lightheadedness and fatigue symptoms.  Denies any chest pain, shortness of breath.  Unfortunately, she is still smoking, but is down from 1 pack daily to 2-3 cigarettes daily.     Vitals:   12/31/23 1314  BP: 102/62  Pulse: (!) 58  Resp: 16  SpO2: 96%      Review of Systems  Constitutional: Positive for malaise/fatigue.  Cardiovascular:  Negative for chest pain, dyspnea on exertion, leg swelling, palpitations and syncope.  Neurological:  Positive for light-headedness.        Studies Reviewed: Marland Kitchen         Independently interpreted 09/2023: Hb 15.2 Cr 0.83  03/2023: TSH 0/127, free T4 1.1 normal  01/2023: Chol 152, TG 131, HDL 47, LDL 82   Physical Exam Vitals and nursing note reviewed.  Constitutional:      General: She is not in acute distress. Neck:     Vascular: No JVD.  Cardiovascular:     Rate and Rhythm: Normal rate and regular rhythm.     Heart sounds: Normal heart sounds. No murmur heard. Pulmonary:     Effort: Pulmonary effort is normal.     Breath sounds: Normal breath sounds. No wheezing or rales.  Musculoskeletal:     Right lower leg: No edema.     Left lower leg: No edema.      VISIT DIAGNOSES:   ICD-10-CM   1. Nicotine dependence, cigarettes, uncomplicated  F17.210     2. Essential hypertension  I10     3. Mixed hyperlipidemia  E78.2        Amanda Cardenas is a 58 y.o. female with hypertension, hyperlipidemia, solitary kidney, h/o NSTEMI  with no obstructive CAD (2019)   Hypertension: SBP ranging 100s.  She was taking hydralazine 50 mg once daily, as opposed to as needed.  Stop hydralazine.  Also reduce chlorthalidone from 25 mg daily to 12.5 mg daily.  Continue metoprolol succinate 20 mg daily.  If her fatigue symptoms do not improve, I would then try to wean her off metoprolol in the future.  Mixed hyperipidemia: Tolerating Lipitor 20 mg daily without incident and myalgias. Check fasting lipid panel.  Nicotine dependence: Tobacco cessation counseling:  - Currently smoking 2-3 cigarettes/day   - Patient was informed of the dangers of tobacco abuse including stroke, cancer, and MI, as well as benefits of tobacco cessation. - Patient is willing to quit at this time. - Approximately 5 mins were spent counseling patient cessation techniques. We discussed various methods to help quit smoking, including deciding on a date to quit, joining a support group, pharmacological agents. Patient would like to use nicotine gum. - I will reassess her progress at the next follow-up visit    Meds ordered this encounter  Medications   chlorthalidone (HYGROTON) 25 MG tablet    Sig: Take 0.5 tablets (12.5 mg total) by mouth daily.    Dispense:  45 tablet    Refill:  2  F/u in 6 months  Signed, Elder Negus, MD

## 2023-12-31 NOTE — Patient Instructions (Signed)
 Medication Instructions:  Continue to hold amlodipine   STOP Hydralazine  Decrease Chlorthalidone 25 to take a 0.5 tablet daily    *If you need a refill on your cardiac medications before your next appointment, please call your pharmacy*  Lab Work: Fasting lipid panel   If you have labs (blood work) drawn today and your tests are completely normal, you will receive your results only by: MyChart Message (if you have MyChart) OR A paper copy in the mail If you have any lab test that is abnormal or we need to change your treatment, we will call you to review the results.  Follow-Up: At [facility], you and your health needs are our priority.  As part of our continuing mission to provide you with exceptional heart care, our providers are all part of one team.  This team includes your primary Cardiologist (physician) and Advanced Practice Providers or APPs (Physician Assistants and Nurse Practitioners) who all work together to provide you with the care you need, when you need it.  Your next appointment:   6 month(s)  Provider:   Elder Negus, MD      Other Instructions       1st Floor: - Lobby - Registration  - Pharmacy  - Lab - Cafe  2nd Floor: - PV Lab - Diagnostic Testing (echo, CT, nuclear med)  3rd Floor: - Vacant  4th Floor: - TCTS (cardiothoracic surgery) - AFib Clinic - Structural Heart Clinic - Vascular Surgery  - Vascular Ultrasound  5th Floor: - HeartCare Cardiology (general and EP) - Clinical Pharmacy for coumadin, hypertension, lipid, weight-loss medications, and med management appointments    Valet parking services will be available as well.

## 2024-01-08 LAB — LIPID PANEL
Chol/HDL Ratio: 3.4 ratio (ref 0.0–4.4)
Cholesterol, Total: 139 mg/dL (ref 100–199)
HDL: 41 mg/dL (ref >39)
LDL Chol Calc (NIH): 77 mg/dL (ref 0–99)
Triglycerides: 116 mg/dL (ref 0–149)
VLDL Cholesterol Cal: 21 mg/dL (ref 5–40)

## 2024-01-09 ENCOUNTER — Encounter: Payer: Self-pay | Admitting: Cardiology

## 2024-05-02 ENCOUNTER — Ambulatory Visit: Attending: Cardiology | Admitting: Cardiology

## 2024-05-02 ENCOUNTER — Encounter: Payer: Self-pay | Admitting: Cardiology

## 2024-05-02 VITALS — BP 143/103 | HR 74 | Wt 179.8 lb

## 2024-05-02 DIAGNOSIS — R072 Precordial pain: Secondary | ICD-10-CM | POA: Insufficient documentation

## 2024-05-02 DIAGNOSIS — I10 Essential (primary) hypertension: Secondary | ICD-10-CM | POA: Insufficient documentation

## 2024-05-02 DIAGNOSIS — E782 Mixed hyperlipidemia: Secondary | ICD-10-CM | POA: Diagnosis present

## 2024-05-02 MED ORDER — LABETALOL HCL 200 MG PO TABS: 200.0000 mg | ORAL_TABLET | Freq: Two times a day (BID) | ORAL | 3 refills | Status: DC

## 2024-05-02 NOTE — Patient Instructions (Addendum)
 Medication Instructions:  START Labetalol 200 mg twice daily  STOP Metoprolol  Succinate   *If you need a refill on your cardiac medications before your next appointment, please call your pharmacy*  Follow-Up: At [facility], you and your health needs are our priority.  As part of our continuing mission to provide you with exceptional heart care, our providers are all part of one team.  This team includes your primary Cardiologist (physician) and Advanced Practice Providers or APPs (Physician Assistants and Nurse Practitioners) who all work together to provide you with the care you need, when you need it.  Your next appointment:   4 week(s)  Provider:   One of our Advanced Practice Providers (APPs): Morse Clause, PA-C  Lamarr Satterfield, NP Miriam Shams, NP  Olivia Pavy, PA-C Josefa Beauvais, NP  Leontine Salen, PA-C Orren Fabry, PA-C  [place], [place] Jackee Alberts, NP  Damien Braver, NP Jon Hails, PA-C  Waddell Donath, PA-C    Dayna Dunn, PA-C  Glendia Ferrier, PA-C Lum Louis, NP Katlyn West, NP Callie Goodrich, PA-C  Evan Williams, PA-C Sheng Haley, PA-C  Xika Zhao, NP Kathleen Johnson, PA-C

## 2024-05-02 NOTE — Progress Notes (Signed)
 Cardiology Office Note:  .   Date:  05/02/2024  ID:  Amanda Cardenas, Amanda Cardenas 10-07-1965, MRN [id number] PCP: Dow Ozell PARAS, PA  [facility] Providers Cardiologist:  Newman Lawrence, MD PCP: Dow Ozell PARAS, PA  Chief Complaint  Patient presents with   Chest Pain     Amanda Cardenas is a 58 y.o. female with hypertension, hyperlipidemia, solitary kidney, h/o NSTEMI with no obstructive CAD (2019)   Patient is here with her daughter today.  Patient has been having a lot of family related stress.  When she is under emotional stress, she has had blood pressure as high as 210/130 mmHg.  When under emotional stress, she also has episodes of sharp chest pain lasting for few seconds to minutes.  She is currently taking metoprolol  succinate 100 mg daily, lisinopril  40 mg daily, and hydralazine  50 mg as needed for spikes of blood pressure.   Vitals:   05/02/24 1041  BP: (!) 143/103  Pulse: 74  SpO2: 94%       Review of Systems  Cardiovascular:  Positive for chest pain. Negative for dyspnea on exertion, leg swelling, palpitations and syncope.  Neurological:  Positive for light-headedness.        Studies Reviewed: SABRA        EKG 05/02/2024: Normal sinus rhythm Normal ECG When compared with ECG of 11-Sep-2023 07:13, No significant change was found     Echocardiogram 2023: Hyperdynamic LV systolic function with visual EF >70%. Left ventricle cavity is normal in size. Normal left ventricular wall thickness. Normal global wall motion. Normal diastolic filling pattern, normal LAP. Mild tricuspid regurgitation. No evidence of pulmonary hypertension. RVSP measures 32 mmHg. Compared to 10/26/2017 LVEF is now hyperdynamic otherwise no significant change.  Lexiscan /modified Bruce Tetrofosmin stress test 2023: Lexiscan /modified Bruce nuclear stress test performed using 1-day protocol. Patient exercised for 4.7 METS and reached 70% MPHR.  Patient was hypertensive throughout the  study, BP ranging from 160-180/80 mmHg.  Stress EKG is non-diagnostic, as this is pharmacological stress test. In addition, stress EKG at 70% MPHR showed sinus tachycardia, no significant ST-T abnormality.  Mild soft tissue attenuation in inferior myocardium without any definite evidence of ischemia/ infarction. Stress LVEF 79%. Low risk study.    Labs 01/2024: Chol 139, TG 116, HDL 41, LDL 77  09/2023: Hb 15.2 Cr 0.83  03/2023: TSH 0/127, free T4 1.1 normal  01/2023: Chol 152, TG 131, HDL 47, LDL 82   Physical Exam Vitals and nursing note reviewed.  Constitutional:      General: She is not in acute distress. Neck:     Vascular: No JVD.  Cardiovascular:     Rate and Rhythm: Normal rate and regular rhythm.     Heart sounds: Normal heart sounds. No murmur heard. Pulmonary:     Effort: Pulmonary effort is normal.     Breath sounds: Normal breath sounds. No wheezing or rales.  Musculoskeletal:     Right lower leg: No edema.     Left lower leg: No edema.      VISIT DIAGNOSES:   ICD-10-CM   1. Primary hypertension  I10     2. Mixed hyperlipidemia  E78.2     3. Precordial pain  R07.2 EKG 12-Lead        Amanda Cardenas is a 58 y.o. female with hypertension, hyperlipidemia, solitary kidney, h/o NSTEMI with no obstructive CAD (2019)   Hypertension: Uncontrolled. Stress is playing a definite role in her uncontrolled hypertension. Change metoprolol  succinate 100  mg daily to labetalol 200 mg twice daily, can be uptitrated to up to 300 mg twice daily. Continue lisinopril  40 mg daily. Take hydralazine  50 mg as needed for SBP spikes >160 mmHg. Follow-up in 4 to 6 weeks with APP to reassess blood pressure control.  Precordial pain: Associated with stress and blood pressure spikes. If precordial pain persist in spite of better blood pressure control, recommend coronary CT angiogram.  Previously, noted to have nonobstructive CAD in the setting of non-STEMI in 2019.  Mixed  hyperipidemia: Well-controlled on Lipitor  20 mg daily.  Continue the same.   Meds ordered this encounter  Medications   labetalol (NORMODYNE) 200 MG tablet    Sig: Take 1 tablet (200 mg total) by mouth 2 (two) times daily.    Dispense:  180 tablet    Refill:  3     F/u in 4-6 weeks w/APP  Signed, Newman JINNY Lawrence, MD

## 2024-05-16 ENCOUNTER — Emergency Department (HOSPITAL_COMMUNITY)

## 2024-05-16 ENCOUNTER — Other Ambulatory Visit: Payer: Self-pay

## 2024-05-16 ENCOUNTER — Emergency Department (HOSPITAL_COMMUNITY)
Admission: EM | Admit: 2024-05-16 | Discharge: 2024-05-16 | Disposition: A | Discharge status: Home / Self Care | Attending: Emergency Medicine | Admitting: Emergency Medicine

## 2024-05-16 ENCOUNTER — Encounter (HOSPITAL_COMMUNITY): Payer: Self-pay | Admitting: *Deleted

## 2024-05-16 DIAGNOSIS — E860 Dehydration: Secondary | ICD-10-CM | POA: Insufficient documentation

## 2024-05-16 DIAGNOSIS — Z79899 Other long term (current) drug therapy: Secondary | ICD-10-CM | POA: Insufficient documentation

## 2024-05-16 DIAGNOSIS — I2510 Atherosclerotic heart disease of native coronary artery without angina pectoris: Secondary | ICD-10-CM | POA: Insufficient documentation

## 2024-05-16 DIAGNOSIS — K5792 Diverticulitis of intestine, part unspecified, without perforation or abscess without bleeding: Secondary | ICD-10-CM

## 2024-05-16 DIAGNOSIS — I959 Hypotension, unspecified: Secondary | ICD-10-CM | POA: Insufficient documentation

## 2024-05-16 DIAGNOSIS — I10 Essential (primary) hypertension: Secondary | ICD-10-CM | POA: Diagnosis not present

## 2024-05-16 DIAGNOSIS — K5732 Diverticulitis of large intestine without perforation or abscess without bleeding: Secondary | ICD-10-CM | POA: Diagnosis not present

## 2024-05-16 DIAGNOSIS — R1032 Left lower quadrant pain: Secondary | ICD-10-CM | POA: Diagnosis present

## 2024-05-16 LAB — COMPREHENSIVE METABOLIC PANEL WITH GFR
ALT: 16 U/L (ref 0–44)
AST: 18 U/L (ref 15–41)
Albumin: 3.7 g/dL (ref 3.5–5.0)
Alkaline Phosphatase: 63 U/L (ref 38–126)
Anion gap: 12 (ref 5–15)
BUN: 21 mg/dL — ABNORMAL HIGH (ref 6–20)
CO2: 25 mmol/L (ref 22–32)
Calcium: 9.4 mg/dL (ref 8.9–10.3)
Chloride: 101 mmol/L (ref 98–111)
Creatinine, Ser: 1.49 mg/dL — ABNORMAL HIGH (ref 0.44–1.00)
GFR, Estimated: 40 mL/min — ABNORMAL LOW (ref >60)
Glucose, Bld: 154 mg/dL — ABNORMAL HIGH (ref 70–99)
Potassium: 3.7 mmol/L (ref 3.5–5.1)
Sodium: 138 mmol/L (ref 135–145)
Total Bilirubin: 0.7 mg/dL (ref 0.0–1.2)
Total Protein: 6.3 g/dL — ABNORMAL LOW (ref 6.5–8.1)

## 2024-05-16 LAB — URINALYSIS, ROUTINE W REFLEX MICROSCOPIC
Bilirubin Urine: NEGATIVE
Glucose, UA: NEGATIVE mg/dL
Hgb urine dipstick: NEGATIVE
Ketones, ur: NEGATIVE mg/dL
Leukocytes,Ua: NEGATIVE
Nitrite: NEGATIVE
Protein, ur: NEGATIVE mg/dL
Specific Gravity, Urine: 1.023 (ref 1.005–1.030)
pH: 5 (ref 5.0–8.0)

## 2024-05-16 LAB — CBC
HCT: 39.3 % (ref 36.0–46.0)
Hemoglobin: 12.8 g/dL (ref 12.0–15.0)
MCH: 29.8 pg (ref 26.0–34.0)
MCHC: 32.6 g/dL (ref 30.0–36.0)
MCV: 91.6 fL (ref 80.0–100.0)
Platelets: 223 K/uL (ref 150–400)
RBC: 4.29 MIL/uL (ref 3.87–5.11)
RDW: 13.2 % (ref 11.5–15.5)
WBC: 7.1 K/uL (ref 4.0–10.5)
nRBC: 0 % (ref 0.0–0.2)

## 2024-05-16 LAB — I-STAT CG4 LACTIC ACID, ED
Lactic Acid, Venous: 2.2 mmol/L (ref 0.5–1.9)
Lactic Acid, Venous: 1.7 mmol/L (ref 0.5–1.9)

## 2024-05-16 LAB — TROPONIN I (HIGH SENSITIVITY)
Troponin I (High Sensitivity): 4 ng/L (ref <18)
Troponin I (High Sensitivity): 4 ng/L (ref <18)

## 2024-05-16 LAB — LIPASE, BLOOD: Lipase: 25 U/L (ref 11–51)

## 2024-05-16 MED ORDER — IOHEXOL 350 MG/ML SOLN
60.0000 mL | Freq: Once | INTRAVENOUS | Status: AC | PRN
  Administered 2024-05-16: 60 mL via INTRAVENOUS

## 2024-05-16 MED ORDER — AMOXICILLIN-POT CLAVULANATE 875-125 MG PO TABS: 1.0000 | ORAL_TABLET | Freq: Two times a day (BID) | ORAL | 0 refills | Status: DC

## 2024-05-16 MED ORDER — METOCLOPRAMIDE HCL 10 MG PO TABS: 10.0000 mg | ORAL_TABLET | Freq: Four times a day (QID) | ORAL | 0 refills | Status: DC

## 2024-05-16 MED ORDER — METOCLOPRAMIDE HCL 5 MG/ML IJ SOLN
10.0000 mg | Freq: Once | INTRAMUSCULAR | Status: AC
  Administered 2024-05-16: 10 mg via INTRAVENOUS
  Filled 2024-05-16: qty 2

## 2024-05-16 MED ORDER — LACTATED RINGERS IV BOLUS
1000.0000 mL | Freq: Once | INTRAVENOUS | Status: AC
  Administered 2024-05-16: 1000 mL via INTRAVENOUS

## 2024-05-16 NOTE — Discharge Instructions (Addendum)
 Thank you for coming to [facility] Emergency Department. You were seen for low blood pressure and abdominal pain. We did an exam, labs, and imaging, and these showed a blood pressure that improved with fluids and also diverticulitis on the CT scan. Please take augmentin  twice per day for diverticulitis. Reglan  10 mg every 6-8 hours for nausea/vomiting. Please stay well hydrated at home. Please follow a clear liquid diet for 2-3 days. Please stop taking your blood pressure medication for now until you follow up with your primary doctor.   Please follow up with your primary care provider within 1 week.   Do not hesitate to return to the ED or call 911 if you experience: -Worsening symptoms -Nausea/vomiting so severe you cannot/eat drink anything -Lightheadedness, passing out -Fevers/chills -Anything else that concerns you

## 2024-05-16 NOTE — ED Provider Triage Note (Signed)
 Emergency Medicine Provider Triage Evaluation Note  Amanda Cardenas , a 59 y.o. female  was evaluated in triage.  Pt complains of hypertension.  Patient reports that she is on medication for hypertension and last took her home blood pressure medication this morning.  States that she recently an increase in her blood pressure medication dose due to increased life stressors driving her blood pressure up.  Denies any chest pain, shortness of breath, or lower extremity swelling or edema.  Denies any vomiting, diarrhea or obvious GI loss.  Review of Systems  Positive: As above Negative: As above  Physical Exam  BP (!) 86/61 (BP Location: Left Arm)   Pulse 75   Temp 97.6 F (36.4 C)   Resp (!) 21   Ht 5' 2 (1.575 m)   Wt 81.6 kg   LMP 10/18/2011 (LMP Unknown)   SpO2 97%   BMI 32.90 kg/m  Gen:   Awake, no distress   Resp:  Normal effort  MSK:   Moves extremities without difficulty  Other:    Medical Decision Making  Medically screening exam initiated at 5:03 PM.  Appropriate orders placed.  Amanda Cardenas was informed that the remainder of the evaluation will be completed by another provider, this initial triage assessment does not replace that evaluation, and the importance of remaining in the ED until their evaluation is complete.     Ajah Vanhoose A, PA-C 05/16/24 [phone]
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
## 2024-05-16 NOTE — ED Triage Notes (Signed)
 Low bp since last pm  she did not take her med today  the bp has continued to be low  nauseated

## 2024-05-21 LAB — CULTURE, BLOOD (ROUTINE X 2)
Culture: NO GROWTH
Culture: NO GROWTH
Special Requests: ADEQUATE

## 2024-05-30 ENCOUNTER — Ambulatory Visit: Attending: Nurse Practitioner | Admitting: Nurse Practitioner

## 2024-05-30 ENCOUNTER — Encounter: Payer: Self-pay | Admitting: Nurse Practitioner

## 2024-05-30 VITALS — BP 140/86 | HR 81 | Ht 62.0 in | Wt 182.6 lb

## 2024-05-30 DIAGNOSIS — R072 Precordial pain: Secondary | ICD-10-CM | POA: Diagnosis present

## 2024-05-30 DIAGNOSIS — E785 Hyperlipidemia, unspecified: Secondary | ICD-10-CM | POA: Insufficient documentation

## 2024-05-30 DIAGNOSIS — I2510 Atherosclerotic heart disease of native coronary artery without angina pectoris: Secondary | ICD-10-CM | POA: Insufficient documentation

## 2024-05-30 DIAGNOSIS — I10 Essential (primary) hypertension: Secondary | ICD-10-CM | POA: Insufficient documentation

## 2024-05-30 MED ORDER — LOSARTAN POTASSIUM 25 MG PO TABS: 25.0000 mg | ORAL_TABLET | Freq: Every day | ORAL | 3 refills | Status: DC

## 2024-05-30 NOTE — Progress Notes (Signed)
 Office Visit    Patient Name: Amanda Cardenas Date of Encounter: 05/30/2024  Primary Care Provider:  Jerome Heron Ruth, PA-C Primary Cardiologist:  Newman JINNY Lawrence, MD  Chief Complaint    58 year old female with a history of NSTEMI, nonobstructive CAD, hypertension, hyperlipidemia, atrophic left kidney, GERD, and gout who presents for follow-up related to chest pain.  Past Medical History    Past Medical History:  Diagnosis Date   Abnormal thyroid  ultrasound 02/08/2017   multinodular goiter   Acid reflux    Arthritis    Atherosclerosis of native coronary artery with angina pectoris with documented spasm (HCC) 10/25/2017   Baker cyst    Barrett's esophagus    Colon polyps    Constipation 05/16/2023   Depression 02/05/2018   Gout    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   Gout       10/1 IMO update     H/O colonoscopy 08/2016   Hx of cardiac cath 10/25/2017   NO INTERVENTION   Hypertension    Hyperthyroidism    Migraines    Pain in left foot 05/07/2020   Screening for malignant neoplasm of colon 05/16/2023   Vitamin D deficiency    Past Surgical History:  Procedure Laterality Date   ABDOMINAL HYSTERECTOMY     APPENDECTOMY     BIOPSY  04/15/2022   Procedure: BIOPSY;  Surgeon: Rollin Dover, MD;  Location: [facility];  Service: Gastroenterology;;   carpal tunnel repair     ENDOVENOUS ABLATION SAPHENOUS VEIN W/ LASER Left 09/27/2017   endovenous laser ablation left greater saphenous vein and stab phlebectomy > 20 incisions left leg by Lynwood Collum MD   ESOPHAGOGASTRODUODENOSCOPY (EGD) WITH PROPOFOL  N/A 04/15/2022   Procedure: ESOPHAGOGASTRODUODENOSCOPY (EGD) WITH PROPOFOL ;  Surgeon: Rollin Dover, MD;  Location: [facility];  Service: Gastroenterology;  Laterality: N/A;   HIATAL HERNIA REPAIR  2000   LEFT HEART CATH AND CORONARY ANGIOGRAPHY N/A 10/25/2017   Procedure: LEFT HEART CATH AND CORONARY ANGIOGRAPHY;  Surgeon: Ladona Heinz, MD;  Location: [facility];   Service: Cardiovascular;  Laterality: N/A;   MEDIASTERNOTOMY N/A 04/11/2018   Procedure: MEDIAN STERNOTOMY;  Surgeon: Kerrin Elspeth BROCKS, MD;  Location: [facility] OR;  Service: Thoracic;  Laterality: N/A;   THYMECTOMY N/A 04/11/2018   Procedure: THYMECTOMY;  Surgeon: Kerrin Elspeth BROCKS, MD;  Location: [facility] OR;  Service: Thoracic;  Laterality: N/A;   TONSILLECTOMY      Allergies  Allergies  Allergen Reactions   Zofran  [Ondansetron  Hcl] Hives   Tramadol Nausea Only    *ANALGESICS-OPIOIDS*   Prednisone  Other (See Comments)    Pt reports hyperactivity, makes me crazy     Labs/Other Studies Reviewed    The following studies were reviewed today:  Cardiac Studies & Procedures   ______________________________________________________________________________________________ CARDIAC CATHETERIZATION  CARDIAC CATHETERIZATION 10/25/2017  Conclusion Coronary angiogram 10/25/2017: Normal LV systolic function without wall motion abnormality. Left dominant circulation, tortuous coronary arteries but no significant disease suggestive of hypertensive heart disease. Small nondominant RCA with anterior origin.  Ascending aortogram reveals presence of 3 aortic valve cusps without aortic regurgitation, no dissection was evident, no aneurysm.  Recommendation: Patient has positive cardiac markers with EKG abnormalities suggestive of non-STEMI. She may have had coronary spasm that could have lead to non-STEMI. I will observe the patient tonight and trend the serum troponin and will probably discharge in the morning. She was told to have normal LV function without significant structural abnormality by outpatient echocardiogram which I do not have the results  of.  Findings Coronary Findings Diagnostic  Dominance: Left  Left Anterior Descending Vessel is angiographically normal.  Left Circumflex Vessel is angiographically normal.  Right Coronary Artery Vessel is small. Vessel is angiographically  normal.  Intervention  No interventions have been documented.   STRESS TESTS  PCV MYOCARDIAL PERFUSION WO LEXISCAN  02/15/2022  Interpretation Summary Lexiscan /modified Bruce Tetrofosmin stress test 02/15/2022: Lexiscan /modified Bruce nuclear stress test performed using 1-day protocol. Patient exercised for 4.7 METS and reached 70% MPHR. Patient was hypertensive throughout the study, BP ranging from 160-180/80 mmHg. Stress EKG is non-diagnostic, as this is pharmacological stress test. In addition, stress EKG at 70% MPHR showed sinus tachycardia, no significant ST-T abnormality. Mild soft tissue attenuation in inferior myocardium without any definite evidence of ischemia/ infarction. Stress LVEF 79%. Low risk study.   ECHOCARDIOGRAM  PCV ECHOCARDIOGRAM COMPLETE 03/01/2022  Narrative Echocardiogram 03/01/2022: Hyperdynamic LV systolic function with visual EF >70%. Left ventricle cavity is normal in size. Normal left ventricular wall thickness. Normal global wall motion. Normal diastolic filling pattern, normal LAP. Mild tricuspid regurgitation. No evidence of pulmonary hypertension. RVSP measures 32 mmHg. Compared to 10/26/2017 LVEF is now hyperdynamic otherwise no significant change.          ______________________________________________________________________________________________     Recent Labs: 05/16/2024: ALT 16; BUN 21; Creatinine, Ser 1.49; Hemoglobin 12.8; Platelets 223; Potassium 3.7; Sodium 138  Recent Lipid Panel    Component Value Date/Time   CHOL 139 01/08/2024 0759   TRIG 116 01/08/2024 0759   HDL 41 01/08/2024 0759   CHOLHDL 3.4 01/08/2024 0759   CHOLHDL 3.5 10/25/2017 0617   VLDL 43 (H) 10/25/2017 0617   LDLCALC 77 01/08/2024 0759    History of Present Illness    58 year old female with a history with the above past medical history including NSTEMI, nonobstructive CAD, hypertension, hyperlipidemia, atrophic left kidney, GERD, and gout.  She was  hospitalized in 2019 in the setting of NSTEMI. Cardiac catheterization revealed nonobstructive CAD. Lexiscan  Myoview in 01/2022 showed no evidence of ischemia.  Echocardiogram in 01/2022 showed EF greater than 70%, mild tricuspid valve regurgitation.  Renal ultrasound in 04/2023 showed no evidence of renal artery stenosis.  She was last seen in office on 05/02/2024 and noted intermittent chest discomfort at rest, intermittently elevated BP in the setting of increased emotional stress.  Metoprolol  was transitioned to labetalol .  It was noted that should she have ongoing chest pain, coronary CT angiogram could be considered.  She was seen in the ED on 05/16/2024 in the setting of hypotension, abdominal pain.  Troponin was negative x 2. CT of the abdomen/pelvis revealed colonic diverticulosis with suggestion of early sigmoid diverticulitis, large hiatal hernia. She received IV fluids and was discharged home.  She presents today for follow-up.  Since her last visit she has been stable from a cardiac standpoint.  She denies any recurrent chest pain.  She stopped taking her chlorthalidone , labetalol , and losartan  in the setting of hypotension, and self-resumed Imdur . She has been taking Imdur  30 mg twice daily.  BP has been stable.  Overall, she reports feeling well.  Home Medications    Current Outpatient Medications  Medication Sig Dispense Refill   albuterol  (PROVENTIL  HFA;VENTOLIN  HFA) 108 (90 Base) MCG/ACT inhaler Inhale 2 puffs into the lungs every 6 (six) hours as needed for wheezing or shortness of breath.      amphetamine -dextroamphetamine  (ADDERALL) 30 MG tablet Take 30 mg by mouth 2 (two) times daily. IN THE MORNING & AT NOON.  atorvastatin  (LIPITOR ) 20 MG tablet Take 20 mg by mouth daily.     buPROPion  (WELLBUTRIN  XL) 300 MG 24 hr tablet Take 300 mg by mouth daily.     isosorbide  mononitrate (IMDUR ) 30 MG 24 hr tablet Take 30 mg by mouth daily. (Patient taking differently: Take 30 mg by mouth 2 (two)  times daily.)     oxyCODONE  (ROXICODONE ) 15 MG immediate release tablet Take 15 mg by mouth 4 (four) times daily as needed for pain.     pantoprazole  (PROTONIX ) 40 MG tablet Take 1 tablet (40 mg total) by mouth daily. 30 tablet 2   predniSONE  (DELTASONE ) 5 MG tablet Take 5 mg by mouth daily with breakfast.     pregabalin (LYRICA) 200 MG capsule Take 200 mg by mouth daily as needed (For pain).     sertraline  (ZOLOFT ) 100 MG tablet Take 100 mg by mouth daily.     Vitamin D, Ergocalciferol, (DRISDOL) 1.25 MG (50000 UNIT) CAPS capsule Take 50,000 Units by mouth once a week.     amoxicillin -clavulanate (AUGMENTIN ) 875-125 MG tablet Take 1 tablet by mouth every 12 (twelve) hours. (Patient not taking: Reported on 05/30/2024) 14 tablet 0   chlorthalidone  (HYGROTON ) 25 MG tablet Take 0.5 tablets (12.5 mg total) by mouth daily. (Patient not taking: Reported on 05/30/2024) 45 tablet 2   fluticasone (FLONASE) 50 MCG/ACT nasal spray Place 1 spray into both nostrils daily. (Patient not taking: Reported on 05/30/2024)     hydrALAZINE  (APRESOLINE ) 50 MG tablet Take 50 mg by mouth as needed. (Patient not taking: Reported on 05/30/2024)     labetalol  (NORMODYNE ) 200 MG tablet Take 1 tablet (200 mg total) by mouth 2 (two) times daily. (Patient not taking: Reported on 05/30/2024) 180 tablet 3   losartan  (COZAAR ) 100 MG tablet TAKE 1 TABLET BY MOUTH EVERY DAY (Patient not taking: Reported on 05/30/2024) 90 tablet 3   metoCLOPramide  (REGLAN ) 10 MG tablet Take 1 tablet (10 mg total) by mouth every 6 (six) hours. (Patient not taking: Reported on 05/30/2024) 30 tablet 0   No current facility-administered medications for this visit.     Review of Systems    She denies chest pain, palpitations, dyspnea, pnd, orthopnea, n, v, dizziness, syncope, edema, weight gain, or early satiety. All other systems reviewed and are otherwise negative except as noted above.   Physical Exam    VS:  BP (!) 140/86 (BP Location: Left Arm, Patient  Position: Sitting, Cuff Size: Normal)   Pulse 81   Ht 5' 2 (1.575 m)   Wt 182 lb 9.6 oz (82.8 kg)   LMP 10/18/2011 (LMP Unknown)   SpO2 97%   BMI 33.40 kg/m   GEN: Well nourished, well developed, in no acute distress. HEENT: normal. Neck: Supple, no JVD, carotid bruits, or masses. Cardiac: RRR, no murmurs, rubs, or gallops. No clubbing, cyanosis, edema.  Radials/DP/PT 2+ and equal bilaterally.  Respiratory:  Respirations regular and unlabored, clear to auscultation bilaterally. GI: Soft, nontender, nondistended, BS + x 4. MS: no deformity or atrophy. Skin: warm and dry, no rash. Neuro:  Strength and sensation are intact. Psych: Normal affect.  Accessory Clinical Findings    ECG personally reviewed by me today -    - no EKG in office today.   Lab Results  Component Value Date   WBC 7.1 05/16/2024   HGB 12.8 05/16/2024   HCT 39.3 05/16/2024   MCV 91.6 05/16/2024   PLT 223 05/16/2024   Lab Results  Component Value  Date   CREATININE 1.49 (H) 05/16/2024   BUN 21 (H) 05/16/2024   NA 138 05/16/2024   K 3.7 05/16/2024   CL 101 05/16/2024   CO2 25 05/16/2024   Lab Results  Component Value Date   ALT 16 05/16/2024   AST 18 05/16/2024   ALKPHOS 63 05/16/2024   BILITOT 0.7 05/16/2024   Lab Results  Component Value Date   CHOL 139 01/08/2024   HDL 41 01/08/2024   LDLCALC 77 01/08/2024   TRIG 116 01/08/2024   CHOLHDL 3.4 01/08/2024    No results found for: HGBA1C  Assessment & Plan    1. CAD/chest pain: She was hospitalized in 2019 in the setting of NSTEMI. Cardiac catheterization at the time revealed nonobstructive CAD. Lexiscan  Myoview in 01/2022 showed no evidence of ischemia.  Echocardiogram in 01/2022 showed EF greater than 70%, mild tricuspid valve regurgitation.  Renal ultrasound in 04/2023 showed no evidence of renal artery stenosis.  At her office visit in 05/2024 she reported intermittent chest pain in the setting of elevated BP, emotional stress.  It was noted  that she had ongoing chest pain, coronary CT angiogram could be considered.  She denies any further chest pain.  Continue to monitor for progressive symptoms.  Continue losartan  and Imdur  as below, Lipitor .  2. Hypertension: BP elevated slightly above goal.  Recent hypotension. She stopped taking her chlorthalidone , labetalol , and losartan  in the setting of hypotension, and self-resumed Imdur . She has been taking Imdur  30 mg twice daily.  We discussed that Imdur  is a daily medication. She would like to continue taking Imdur  for now.  Continue Imdur  30 mg daily.  Will resume losartan  25 mg daily.  Will check BMET in 2 weeks.  Continue to monitor BP, goal BP less than 130/80 mmHg.   3. Hyperlipidemia: LDL was 77 in 01/2024, slightly above goal.  If LDL remains elevated above goal, consider escalation of lipid-lowering therapy.  For now, continue Lipitor  at current dose.    4. Disposition:   Follow-up in 3 months, sooner if needed.    Damien JAYSON Braver, NP 05/30/2024, 8:20 AM

## 2024-05-30 NOTE — Patient Instructions (Signed)
 Medication Instructions:  Stop Chlorthalidone  25 mg  Stop Hydralazine  50 mg  Stop Labetalol  200 mg Start Imdur  30 mg daily Decrease Losartan  25 mg daily  *If you need a refill on your cardiac medications before your next appointment, please call your pharmacy*  Lab Work: BMET in 2 weeks  Testing/Procedures: NONE ordered at this time of appointment   Follow-Up: At [facility], you and your health needs are our priority.  As part of our continuing mission to provide you with exceptional heart care, our providers are all part of one team.  This team includes your primary Cardiologist (physician) and Advanced Practice Providers or APPs (Physician Assistants and Nurse Practitioners) who all work together to provide you with the care you need, when you need it.  Your next appointment:   3 month(s)  Provider:   Newman JINNY Lawrence, MD or Damien Braver, NP          We recommend signing up for the patient portal called MyChart.  Sign up information is provided on this After Visit Summary.  MyChart is used to connect with patients for Virtual Visits (Telemedicine).  Patients are able to view lab/test results, encounter notes, upcoming appointments, etc.  Non-urgent messages can be sent to your provider as well.   To learn more about what you can do with MyChart, go to [email or web address].   Other Instructions Goal blood pressure is 130/80 or less

## 2024-06-01 ENCOUNTER — Encounter: Payer: Self-pay | Admitting: Nurse Practitioner

## 2024-06-24 ENCOUNTER — Ambulatory Visit: Admitting: "Endocrinology

## 2024-06-30 ENCOUNTER — Telehealth: Payer: Self-pay | Admitting: Cardiology

## 2024-06-30 ENCOUNTER — Emergency Department (HOSPITAL_COMMUNITY)
Admission: EM | Admit: 2024-06-30 | Discharge: 2024-07-01 | Disposition: A | Attending: Emergency Medicine | Admitting: Emergency Medicine

## 2024-06-30 ENCOUNTER — Encounter (HOSPITAL_COMMUNITY): Payer: Self-pay

## 2024-06-30 ENCOUNTER — Other Ambulatory Visit: Payer: Self-pay

## 2024-06-30 ENCOUNTER — Emergency Department (HOSPITAL_COMMUNITY)

## 2024-06-30 DIAGNOSIS — R0789 Other chest pain: Secondary | ICD-10-CM | POA: Insufficient documentation

## 2024-06-30 DIAGNOSIS — R079 Chest pain, unspecified: Secondary | ICD-10-CM

## 2024-06-30 DIAGNOSIS — Z79899 Other long term (current) drug therapy: Secondary | ICD-10-CM | POA: Insufficient documentation

## 2024-06-30 DIAGNOSIS — I10 Essential (primary) hypertension: Secondary | ICD-10-CM | POA: Insufficient documentation

## 2024-06-30 LAB — CBC
HCT: 43.5 % (ref 36.0–46.0)
Hemoglobin: 14 g/dL (ref 12.0–15.0)
MCH: 30.1 pg (ref 26.0–34.0)
MCHC: 32.2 g/dL (ref 30.0–36.0)
MCV: 93.5 fL (ref 80.0–100.0)
Platelets: 284 K/uL (ref 150–400)
RBC: 4.65 MIL/uL (ref 3.87–5.11)
RDW: 13.2 % (ref 11.5–15.5)
WBC: 9.5 K/uL (ref 4.0–10.5)
nRBC: 0 % (ref 0.0–0.2)

## 2024-06-30 LAB — BASIC METABOLIC PANEL WITH GFR
Anion gap: 13 (ref 5–15)
BUN: 22 mg/dL — ABNORMAL HIGH (ref 6–20)
CO2: 25 mmol/L (ref 22–32)
Calcium: 9.1 mg/dL (ref 8.9–10.3)
Chloride: 101 mmol/L (ref 98–111)
Creatinine, Ser: 1.3 mg/dL — ABNORMAL HIGH (ref 0.44–1.00)
GFR, Estimated: 47 mL/min — ABNORMAL LOW (ref >60)
Glucose, Bld: 150 mg/dL — ABNORMAL HIGH (ref 70–99)
Potassium: 3.7 mmol/L (ref 3.5–5.1)
Sodium: 139 mmol/L (ref 135–145)

## 2024-06-30 LAB — TROPONIN I (HIGH SENSITIVITY): Troponin I (High Sensitivity): 4 ng/L (ref <18)

## 2024-06-30 NOTE — Telephone Encounter (Signed)
 Spoke with patient and she reports that she is going to go to the emergency room since chest pain is coming and going.   FYI: Dr. Ladona are you okay with the switch?

## 2024-06-30 NOTE — Telephone Encounter (Signed)
 Yes, also depends on what happens in the ED and if one else sees her and she may like them

## 2024-06-30 NOTE — Telephone Encounter (Signed)
 Patient is requesting to have a provider switch from Dr. Elmira to Dr. Ladona. Would both of you approve the provider switch?

## 2024-06-30 NOTE — Telephone Encounter (Signed)
 Okay with getting coronary CTA for precordial pain. If ongoing active chest pain though, maybe best to be evaluated urgently in the ER.  Separately, I just replied to a message from her that requested provider to switch to Dr. Ladona.  Please note.  Thanks MJP

## 2024-06-30 NOTE — Telephone Encounter (Signed)
 Okay with me. Patient was originally a patient of Dr. Godfrey, saw me for an urgent visit while at [facility] once and continued to see me couple times since. Absolutely okay to see Dr. Ladona.  Thanks MJP

## 2024-06-30 NOTE — ED Triage Notes (Signed)
 PT states she has had a MI in the past, PT states multiple heart problems fromn the past. PT is alert and talking. PT states she has had chest pain the last 2 days. PT states the pain is in her shoulder and left chest, PT states BP was high.

## 2024-06-30 NOTE — ED Provider Notes (Signed)
 [place] EMERGENCY DEPARTMENT AT [facility] Provider Note   CSN: [id number] Arrival date & time: 06/30/24  2105     Patient presents with: Chest Pain   Amanda Cardenas is a 58 y.o. female.  {Add pertinent medical, surgical, social history, OB history to YEP:[id number]} The history is provided by the patient and medical records.  Chest Pain  58 year old female with history of hypertension, thyroid  disease, migraine headaches, arthritis, gouty arthropathy, presenting to the ED for chest pain.  Patient reports this is actually been ongoing for few months now but has been worse over the past several days.  She reports pain to the left anterior chest and somewhat into the left shoulder but no radiation into the jaw or neck.  She denies any shortness of breath, palpitations, dizziness, diaphoresis, nausea, or vomiting.  She has not had any syncopal events.  States her blood pressure has been a little out of whack recently but cardiology has recently adjusted her medications and thinks she may have got confused on her regimen.  She did call the office today and spoke with Dr. Ladona who encouraged ER evaluation, if everything was normal plan for cardiac CT as an outpatient.  She did have a normal cath in 2019 during NSTEMI, felt possibly due to coronary spasm.  Patient reports she took a dose of tizanidine prior to arrival and symptoms seem to have resolved.  Prior to Admission medications   Medication Sig Start Date End Date Taking? Authorizing Provider  albuterol  (PROVENTIL  HFA;VENTOLIN  HFA) 108 (90 Base) MCG/ACT inhaler Inhale 2 puffs into the lungs every 6 (six) hours as needed for wheezing or shortness of breath.     [provider]  amoxicillin -clavulanate (AUGMENTIN ) 875-125 MG tablet Take 1 tablet by mouth every 12 (twelve) hours. Patient not taking: Reported on 05/30/2024 05/16/24   Franklyn Sid SAILOR, MD  amphetamine -dextroamphetamine  (ADDERALL) 30 MG tablet Take 30 mg by mouth 2  (two) times daily. IN THE MORNING & AT NOON.    [provider]  atorvastatin  (LIPITOR ) 20 MG tablet Take 20 mg by mouth daily. 12/27/23   [provider]  buPROPion  (WELLBUTRIN  XL) 300 MG 24 hr tablet Take 300 mg by mouth daily. 11/16/23   [provider]  fluticasone (FLONASE) 50 MCG/ACT nasal spray Place 1 spray into both nostrils daily. Patient not taking: Reported on 05/30/2024    [provider]  isosorbide  mononitrate (IMDUR ) 30 MG 24 hr tablet Take 30 mg by mouth daily. 05/27/24   [provider]  losartan  (COZAAR ) 25 MG tablet Take 1 tablet (25 mg total) by mouth daily. 05/30/24   Daneen Damien BROCKS, NP  metoCLOPramide  (REGLAN ) 10 MG tablet Take 1 tablet (10 mg total) by mouth every 6 (six) hours. Patient not taking: Reported on 05/30/2024 05/16/24   Franklyn Sid SAILOR, MD  oxyCODONE  (ROXICODONE ) 15 MG immediate release tablet Take 15 mg by mouth 4 (four) times daily as needed for pain. 02/13/22   [provider]  pantoprazole  (PROTONIX ) 40 MG tablet Take 1 tablet (40 mg total) by mouth daily. 04/15/22 05/30/24  Regalado, Belkys A, MD  predniSONE  (DELTASONE ) 5 MG tablet Take 5 mg by mouth daily with breakfast. 04/11/23   [provider]  pregabalin (LYRICA) 200 MG capsule Take 200 mg by mouth daily as needed (For pain).    [provider]  sertraline  (ZOLOFT ) 100 MG tablet Take 100 mg by mouth daily. 01/01/22   [provider]  Vitamin D, Ergocalciferol, (  DRISDOL) 1.25 MG (50000 UNIT) CAPS capsule Take 50,000 Units by mouth once a week. 12/03/23   [provider]    Allergies: Zofran  [ondansetron  hcl], Tramadol, and Prednisone     Review of Systems  Cardiovascular:  Positive for chest pain.  All other systems reviewed and are negative.   Updated Vital Signs BP (!) 142/95   Pulse 64   Temp 97.8 F (36.6 C)   Resp 18   Ht 5' 2 (1.575 m)   Wt 82.6 kg   LMP 10/18/2011 (LMP Unknown)   SpO2 100%   BMI 33.29 kg/m    Physical Exam Vitals and nursing note reviewed.  Constitutional:      Appearance: She is well-developed.  HENT:     Head: Normocephalic and atraumatic.  Eyes:     Conjunctiva/sclera: Conjunctivae normal.     Pupils: Pupils are equal, round, and reactive to light.  Cardiovascular:     Rate and Rhythm: Normal rate and regular rhythm.     Heart sounds: Normal heart sounds.  Pulmonary:     Effort: Pulmonary effort is normal.     Breath sounds: Normal breath sounds.  Abdominal:     General: Bowel sounds are normal.     Palpations: Abdomen is soft.  Musculoskeletal:        General: Normal range of motion.     Cervical back: Normal range of motion.  Skin:    General: Skin is warm and dry.  Neurological:     Mental Status: She is alert and oriented to person, place, and time.     (all labs ordered are listed, but only abnormal results are displayed) Labs Reviewed  BASIC METABOLIC PANEL WITH GFR - Abnormal; Notable for the following components:      Result Value   Glucose, Bld 150 (*)    BUN 22 (*)    Creatinine, Ser 1.30 (*)    GFR, Estimated 47 (*)    All other components within normal limits  CBC  TROPONIN I (HIGH SENSITIVITY)    EKG: None  Radiology: DG Chest 2 View Result Date: 06/30/2024 EXAM: 2 VIEW(S) XRAY OF THE CHEST 06/30/2024 09:50:00 PM COMPARISON: 09/11/2023 CLINICAL HISTORY: L sided CP. Encounter for chest pain, pt called Dr. And was told to come to the hospital FINDINGS: LUNGS AND PLEURA: No focal pulmonary opacity. No pulmonary edema. No pleural effusion. No pneumothorax. HEART AND MEDIASTINUM: CABG markers noted. Sternotomy wires noted. No acute abnormality of the cardiac and mediastinal silhouettes. BONES AND SOFT TISSUES: Hiatal hernia. No acute osseous abnormality. IMPRESSION: 1. No acute cardiopulmonary abnormality. 2. Hiatal hernia. Electronically signed by: Norman Gatlin MD 06/30/2024 09:58 PM EDT RP Workstation: [id number]    {Document cardiac  monitor, telemetry assessment procedure when appropriate:32947} Procedures   Medications Ordered in the ED - No data to display    {Click here for ABCD2, HEART and other calculators REFRESH Note before signing:1}                              Medical Decision Making  ***  {Document critical care time when appropriate  Document review of labs and clinical decision tools ie CHADS2VASC2, etc  Document your independent review of radiology images and any outside records  Document your discussion with family members, caretakers and with consultants  Document social determinants of health affecting pt's care  Document your decision making why or why not admission, treatments were needed:32947:::1}  Final diagnoses:  None    ED Discharge Orders     None

## 2024-06-30 NOTE — Telephone Encounter (Signed)
 Pt c/o BP issue: STAT if pt c/o blurred vision, one-sided weakness or slurred speech. STAT if BP is GREATER than 180/120 TODAY. STAT if BP is LESS than 90/60 and SYMPTOMATIC TODAY  1. What is your BP concern? BP is high  2. Have you taken any BP medication today? Yes  3. What are your last 5 BP readings? 137/96 67 HR while on the phone  4. Are you having any other symptoms (ex. Dizziness, headache, blurred vision, passed out)? Chest Pain and Left arm pain (yesterday)  Pt c/o of Chest Pain: STAT if active CP, including tightness, pressure, jaw pain, radiating pain to shoulder/upper arm/back, CP unrelieved by Nitro. Symptoms reported of SOB, nausea, vomiting, sweating.  1. Are you having CP right now? Yes 2. Are you experiencing any other symptoms (ex. SOB, nausea, vomiting, sweating)? Left arm pain (yesterday) and High BP 3. Is your CP continuous or coming and going? Coming and Going 4. Have you taken Nitroglycerin ? No 5. How long have you been experiencing CP? Started yesterday 6. If NO CP at time of call then end call with telling Pt to call back or call 911 if Chest pain returns prior to return call from triage team.   Patient is very confused when it comes to which medications she should or shouldn't be taking. Patient mentioned she took a green minty pill that sits under her tongue to resolve her symptoms. Patient did not know the name of the medication. Please advise.

## 2024-06-30 NOTE — Telephone Encounter (Signed)
 Spoke with pt and asked if she is still actively having CP. Pt stated she was having CP but the arm pain has resolved after taking a muscle relaxer. Asked pt if she had nitroglycerin . Pt denied having nitroglycerin  but said she had a medication that she placed under her tongue that helped her chest pain and it lowered her blood pressure. Asked pt what the medication on the side of the bottle was and she stated she had given the label to a nurse last time and it doesn't have a label on it anymore. Pt stated Dr. Elmira had stated at the last OV that if the CP persisted, he would want to do a test. Both [facility] and Dr. Gilda notes discuss a possible CCTA if CP persists. Will send to Dr. Elmira to review and decide on next steps. ED precautions discussed with pt- pt told to go to ED if CP persisted without relief, increased in frequency, etc. Pt verbalized understanding of plan and had no further questions at this time.

## 2024-06-30 NOTE — ED Triage Notes (Addendum)
 Pt presents with left sided, non-radiating, intermittent chest pain x 2 days with SOB and elevated BP (160/101 at home), denies n/v; hx of cardiac cath

## 2024-06-30 NOTE — ED Provider Triage Note (Signed)
 Emergency Medicine Provider Triage Evaluation Note  Amanda Cardenas , a 58 y.o. female  was evaluated in triage.  Pt complains of left-sided chest pain, starting 2 days ago, has been intermittent.  Patient has a history of chest surgery for anterior mediastinal cyst in 2019.  Patient states no previous stenting or catheterization, but did have a clean cath per records in January 2019.  Patient reports associated shortness of breath yesterday.  She also had some pain right radiated to her left arm.  She states that she took a muscle relaxer which resolved her symptoms.  Currently symptoms are resolved.  Review of Systems  Positive: Shortness of breath, CP Negative:   Physical Exam  BP (!) 138/93   Pulse 64   Temp 97.8 F (36.6 C)   Resp 18   Ht 5' 2 (1.575 m)   Wt 82.6 kg   LMP 10/18/2011 (LMP Unknown)   SpO2 99%   BMI 33.29 kg/m  Gen:   Awake, no distress   Resp:  Normal effort  MSK:   Moves extremities without difficulty  Other:  Lungs clear to auscultation bilaterally, heart regular rhythm.  Medical Decision Making  Medically screening exam initiated at 9:27 PM.  Appropriate orders placed.  Cheyeanne Hill was informed that the remainder of the evaluation will be completed by another provider, this initial triage assessment does not replace that evaluation, and the importance of remaining in the ED until their evaluation is complete.  Patient denies current pain.    Desiderio Chew, PA-C 06/30/24 2129

## 2024-06-30 NOTE — Telephone Encounter (Signed)
 Noted

## 2024-07-01 ENCOUNTER — Telehealth: Payer: Self-pay | Admitting: Cardiology

## 2024-07-01 ENCOUNTER — Other Ambulatory Visit: Payer: Self-pay | Admitting: Physician Assistant

## 2024-07-01 DIAGNOSIS — Z1231 Encounter for screening mammogram for malignant neoplasm of breast: Secondary | ICD-10-CM

## 2024-07-01 LAB — TROPONIN I (HIGH SENSITIVITY): Troponin I (High Sensitivity): 4 ng/L (ref <18)

## 2024-07-01 NOTE — Telephone Encounter (Signed)
 Called patient verbalized she went to ED yesterday for chest pain and would like advice on what medication to take.  Patient denies chest pain,  lightheadedness or dizziness.Per patient she would like advice and recommendations on what medication to take. Patient reports blood pressure  9/30 148/101, 61  9/29 152/108, 59, 159/98, 61 9/28 145/105, 65 158/95, 66 Made patient aware that this will be provided to provider for advice. Visit made with Dr. Ladona on 10/3. ED precautions reviewed with patient and patient verbalized an understanding.

## 2024-07-01 NOTE — Discharge Instructions (Addendum)
 Cardiac work-up today was reassuring. Follow-up with Dr. Ladona-- looks like they will plan for coronary CT. Return here for any new/acute changes.

## 2024-07-01 NOTE — Telephone Encounter (Signed)
 Pt wants to ask Dr Ladona what heart medications she needs to take. Provider switch is still pending but she wants to ask Dr Ladona. Please advise

## 2024-07-04 ENCOUNTER — Ambulatory Visit: Attending: Cardiology | Admitting: Cardiology

## 2024-07-04 ENCOUNTER — Encounter: Payer: Self-pay | Admitting: Cardiology

## 2024-07-04 VITALS — BP 124/72 | HR 71 | Resp 17 | Ht 62.0 in | Wt 180.0 lb

## 2024-07-04 DIAGNOSIS — I10 Essential (primary) hypertension: Secondary | ICD-10-CM | POA: Insufficient documentation

## 2024-07-04 DIAGNOSIS — K219 Gastro-esophageal reflux disease without esophagitis: Secondary | ICD-10-CM | POA: Insufficient documentation

## 2024-07-04 DIAGNOSIS — K449 Diaphragmatic hernia without obstruction or gangrene: Secondary | ICD-10-CM | POA: Insufficient documentation

## 2024-07-04 DIAGNOSIS — R079 Chest pain, unspecified: Secondary | ICD-10-CM | POA: Insufficient documentation

## 2024-07-04 MED ORDER — NITROGLYCERIN 0.4 MG SL SUBL: 0.4000 mg | SUBLINGUAL_TABLET | SUBLINGUAL | 0 refills | Status: AC | PRN

## 2024-07-04 MED ORDER — AMLODIPINE BESYLATE 10 MG PO TABS
10.0000 mg | ORAL_TABLET | Freq: Every day | ORAL | 0 refills | Status: AC
Start: 2024-07-04 — End: ?

## 2024-07-04 MED ORDER — METOPROLOL SUCCINATE ER 50 MG PO TB24: 50.0000 mg | ORAL_TABLET | Freq: Every day | ORAL | 0 refills | Status: DC

## 2024-07-04 NOTE — Patient Instructions (Signed)
 Medication Instructions:  Your physician has recommended you make the following change in your medication:   ** Amlodipine  10mg  - 1 tablet by mouth daily  ** Metoprolol  Succinate 50mg  - 1 tablet by mouth daily with a meal.  ** Nitroglycerine 0.4mg  as directed *If you need a refill on your cardiac medications before your next appointment, please call your pharmacy*  Lab Work: None ordered.  If you have labs (blood work) drawn today and your tests are completely normal, you will receive your results only by: MyChart Message (if you have MyChart) OR A paper copy in the mail If you have any lab test that is abnormal or we need to change your treatment, we will call you to review the results.  Testing/Procedures: None ordered.   Follow-Up: At [facility], you and your health needs are our priority.  As part of our continuing mission to provide you with exceptional heart care, our providers are all part of one team.  This team includes your primary Cardiologist (physician) and Advanced Practice Providers or APPs (Physician Assistants and Nurse Practitioners) who all work together to provide you with the care you need, when you need it.  Your next appointment:   Follow up with Dr Ladona as needed

## 2024-07-04 NOTE — Progress Notes (Signed)
 Cardiology Office Note:  .   Date:  07/05/2024  ID:  Amanda Cardenas, DOB [birth year], MRN [id number] PCP: Jerome Heron Ruth, PA-C  [facility] Providers Cardiologist:  Newman JINNY Lawrence, MD   History of Present Illness: .   Amanda Cardenas is a 58 y.o. 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  She has also had an echocardiogram on 03/01/2022 revealing hyperdynamic LVEF with mild TR and no evidence of pulm hypertension.  Past medical history significant for hypertension, hyperlipidemia, atrophic left kidney, GERD, and gout.  Seen in the ED on 05/16/2024 in the setting of hypotension and abdominal discomfort, found to have colonic diverticulosis and early sigmoid diverticulitis and large hiatal hernia, antihypertensive medication was discontinued and discharged home after IV hydration. Again seen in the ED on 06/30/24 with chest pain.  She now presents for a 44-month office visit.  Describes chest pain as sharp in the middle of the chest and radiates to the back.  States that muscle relaxants help the chest pain.  There is no exertional component.  Cardiac Studies relevent.    Lexiscan /modified Bruce nuclear stress test  Mild soft tissue attenuation in inferior myocardium without any definite evidence of ischemia/ infarction. Stress LVEF 79%.  Echocardiogram 03/01/2022: Hyperdynamic LV systolic function with visual EF >70%. Left ventricle cavity is normal in size. Normal left ventricular wall thickness. Normal global wall motion. Normal diastolic filling pattern, normal LAP. Mild tricuspid regurgitation. No evidence of pulmonary hypertension. RVSP measures 32 mmHg.  Renal artery duplex  04/10/2023: No evidence of renal artery occlusive disease in either renal artery. Normal intrarenal vascular perfusion is noted in the  right kidney.  Carotid artery duplex 03/01/2022:  No hemodynamically significant arterial disease in the internal carotid  artery bilaterally.   Coronary angiogram 10/25/2017:     Labs   Lab Results  Component Value Date   CHOL 139 01/08/2024   HDL 41 01/08/2024   LDLCALC 77 01/08/2024   TRIG 116 01/08/2024   CHOLHDL 3.4 01/08/2024   No results found for: LIPOA  Recent Labs    09/11/23 0737 05/16/24 1656 06/30/24 2141  NA 138 138 139  K 3.5 3.7 3.7  CL 100 101 101  CO2 28 25 25   GLUCOSE 116* 154* 150*  BUN 15 21* 22*  CREATININE 0.83 1.49* 1.30*  CALCIUM  9.5 9.4 9.1  GFRNONAA >60 40* 47*    Lab Results  Component Value Date   ALT 16 05/16/2024   AST 18 05/16/2024   ALKPHOS 63 05/16/2024   BILITOT 0.7 05/16/2024      Latest Ref Rng & Units 06/30/2024    9:41 PM 05/16/2024    6:51 PM 09/11/2023    7:37 AM  CBC  WBC 4.0 - 10.5 K/uL 9.5  7.1  10.6   Hemoglobin 12.0 - 15.0 g/dL 85.9  87.1  84.7   Hematocrit 36.0 - 46.0 % 43.5  39.3  45.9   Platelets 150 - 400 K/uL 284  223  248    No results found for: HGBA1C  Lab Results  Component Value Date   TSH 0.127 (L) 03/14/2023     ROS  Review of Systems  Cardiovascular:  Positive for chest pain. Negative for dyspnea on exertion and leg swelling.  Musculoskeletal:  Positive for back pain.   Physical Exam:   VS:  BP  124/72 (BP Location: Left Arm, Patient Position: Sitting, Cuff Size: Normal)   Pulse 71   Resp 17   Ht 5' 2 (1.575 m)   Wt 180 lb (81.6 kg)   LMP 10/18/2011 (LMP Unknown)   SpO2 94%   BMI 32.92 kg/m    Wt Readings from Last 3 Encounters:  07/04/24 180 lb (81.6 kg)  06/30/24 182 lb (82.6 kg)  05/30/24 182 lb 9.6 oz (82.8 kg)    BP Readings from Last 3 Encounters:  07/04/24 124/72  07/01/24 132/87  05/30/24 (!) 140/86   Physical Exam Neck:     Vascular: No carotid bruit or JVD.  Cardiovascular:     Rate and Rhythm: Normal rate and regular rhythm.     Pulses: Intact distal  pulses.     Heart sounds: Normal heart sounds. No murmur heard.    No gallop.  Pulmonary:     Effort: Pulmonary effort is normal.     Breath sounds: Normal breath sounds.  Abdominal:     General: Bowel sounds are normal.     Palpations: Abdomen is soft.  Musculoskeletal:     Right lower leg: No edema.     Left lower leg: No edema.    EKG:       EKG 06/30/2024: Normal sinus rhythm at rate of 62 bpm, normal EKG.  ASSESSMENT AND PLAN: .      ICD-10-CM   1. Chest pain due to GERD  K21.9 nitroGLYCERIN  (NITROSTAT ) 0.4 MG SL tablet   R07.9     2. Hiatal hernia  K44.9     3. Primary hypertension  I10 amLODipine  (NORVASC ) 10 MG tablet    metoprolol  succinate (TOPROL -XL) 50 MG 24 hr tablet      Assessment and Plan  1. Chest pain due to GERD (Primary) Her chest pain is a combination of GERD and musculoskeletal issue and muscle spasm.  She has had NSTEMI in 2019.  Suspect hypertensive heart disease as the etiology for her mildly elevated troponin.  She has significantly tortuous coronary vessels.  She would like to have nitroglycerin  just as a safety which I prescribed which may help with her GERD as well. - nitroGLYCERIN  (NITROSTAT ) 0.4 MG SL tablet; Place 1 tablet (0.4 mg total) under the tongue every 5 (five) minutes as needed.  Dispense: 25 tablet; Refill: 0  2. Hiatal hernia She has a very large hiatal hernia and could be contributing to her chest pain as well.  3. Primary hypertension Her blood pressure is well-controlled, however she is confused about the medication.  Although she was supposed to be off of amlodipine , patient states that because of high blood pressure she started taking the amlodipine  that she had at home and since then blood pressure has been well-controlled.  I refilled the prescription for amlodipine  10 mg daily.  There is also confusion about metoprolol , she was supposed to be on metoprolol  succinate either 50 or 100 mg daily however it appears that wrong Rx  with metoprolol  to tartrate was sent at 50 mg daily.  I change this to metoprolol  succinate 50 mg daily, if blood pressure is not well-controlled she may need 100 mg.  She will follow-up with her PCP for further management and we will be paying attention to the type of metoprolol  she is prescribed. - amLODipine  (NORVASC ) 10 MG tablet; Take 1 tablet (10 mg total) by mouth daily.  Dispense: 90 tablet; Refill: 0 - metoprolol  succinate (TOPROL -XL) 50 MG 24 hr tablet; Take  1 tablet (50 mg total) by mouth daily. Take with or immediately following a meal.  Dispense: 90 tablet; Refill: 0  Other orders - chlorthalidone  (HYGROTON ) 25 MG tablet; Take 12.5 mg by mouth daily as needed (Fluid, or if BP > 150 mm Hg). - She is also on chlorthalidone  but has not been using this.  Advised her that she can take chlorthalidone  if blood pressure is greater than 150 mmHg as needed.  She does have single kidney.  I will see her back on a as needed basis.  Follow up: PRN  Signed,  Gordy Bergamo, MD, [facility] 07/05/2024, 12:03 PM [facility] [address] [place], [place] [address] Phone: [phone]. Fax:  [phone]

## 2024-07-07 ENCOUNTER — Inpatient Hospital Stay: Admission: RE | Admit: 2024-07-07 | Source: Ambulatory Visit

## 2024-07-07 DIAGNOSIS — Z1231 Encounter for screening mammogram for malignant neoplasm of breast: Secondary | ICD-10-CM

## 2024-07-30 ENCOUNTER — Ambulatory Visit: Admitting: Cardiology

## 2024-08-08 ENCOUNTER — Other Ambulatory Visit: Payer: Self-pay | Admitting: Cardiology

## 2024-08-08 DIAGNOSIS — I10 Essential (primary) hypertension: Secondary | ICD-10-CM

## 2024-08-08 MED ORDER — METOPROLOL SUCCINATE ER 50 MG PO TB24: 50.0000 mg | ORAL_TABLET | Freq: Every day | ORAL | 0 refills | Status: AC

## 2024-09-02 ENCOUNTER — Encounter: Payer: Self-pay | Admitting: "Endocrinology

## 2024-09-02 ENCOUNTER — Ambulatory Visit: Admitting: "Endocrinology

## 2024-09-02 VITALS — BP 110/80 | HR 77 | Ht 62.0 in | Wt 174.0 lb

## 2024-09-02 DIAGNOSIS — D3502 Benign neoplasm of left adrenal gland: Secondary | ICD-10-CM

## 2024-09-02 DIAGNOSIS — E0590 Thyrotoxicosis, unspecified without thyrotoxic crisis or storm: Secondary | ICD-10-CM | POA: Diagnosis not present

## 2024-09-02 NOTE — Progress Notes (Signed)
 Outpatient Endocrinology Note Amanda Cardenas    Amanda Cardenas 03/29/1966 [id number]  Referring Provider: Jerome Heron Jama Amanda Cardenas* Primary Care Provider: Jerome Heron Jama, PA-C Reason for consultation: Subjective   Assessment & Plan  Diagnoses and all orders for this visit:  Adenoma of left adrenal gland -     DHEA-sulfate -     ACTH -     Cortisol -     Metanephrines, plasma -     Aldosterone + renin activity w/ ratio  Subclinical hyperthyroidism -     T4, free -     T3, free -     TSH -     TRAb (TSH Receptor Binding Antibody) -     Thyroid  stimulating immunoglobulin   Patient is referred due to adrenal incidentaloma which apparently has been there since 2010 05/16/24: CT Abd Pel W contrast:  Adrenals/Urinary Tract: Left adrenal nodule measuring 3.2 x 2.7 cm with Hounsfield 55 previously measured 2.3 cm with characteristics suggestive of adenoma on prior noncontrast CT. 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 06/13/19: CT Abd Pel W contrast Right adrenal gland unremarkable. Nodule of the left adrenal gland demonstrates Hounsfield characteristics on prior noncontrast CT which are compatible with adrenal adenoma. Transverse diameter on the current study 2.3 cm, which is relatively unchanged. There is a  09/01/2009: CT ABDOMEN AND PELVIS WITHOUT CONTRAST: 2.7 cm low attenuation left adrenal adenoma.  Recommend baseline 8 AM adrenal blood work  Subclinical hyperthyroidism noted on labs Patient reports heat intolerance and fatigue Ordered baseline labs  Return in about 4 weeks (around 09/30/2024) for visit and 8 am labs before next visit.   I have reviewed current medications, nurse's notes, allergies, vital signs, past medical and surgical history, family medical history, and social history for this encounter. Counseled patient on symptoms, examination findings, lab findings, imaging results, treatment decisions and monitoring  and prognosis. The patient understood the recommendations and agrees with the treatment plan. All questions regarding treatment plan were fully answered.  Amanda Cardenas  09/02/24  HPI  Amanda Cardenas is a 58 y.o. female  referred by Dr. Jerome for evaluation and management of Left adrenal adenoma present at least since 2010.   She moon face No fat pads No increased girth Yes plethora No hyperpigmentation No purple striae No proximal muscle weakness No acne No vellus/terminal hirsutism Yes scalp hair loss Yes a history of HTN Yes a history of hypokalemia No paroxysmal episodes of anxiety Yes tremors Yes lightheadedness No headache Yes sweating Yes blurry vision Yes, sometimes   fatigue Yes No dysphagia/dysphonia/dyspnea Palpitations Yes  weight change Yes+-5 lbs up or down Constipation Some hyperdefecation No Heat intolerance Yes  Physical Exam  BP 110/80   Pulse 77   Ht 5' 2 (1.575 m)   Wt 174 lb (78.9 kg)   LMP 10/18/2011 (LMP Unknown)   SpO2 98%   BMI 31.83 kg/m    Constitutional: well developed, well nourished No fat pads or stretch marks noted Head: normocephalic, atraumatic Eyes: sclera anicteric, no redness Neck: supple Lungs: normal respiratory effort Neurology: alert and oriented Skin: dry, no appreciable rashes Musculoskeletal: no appreciable defects Psychiatric: normal mood and affect   Current Medications Patient's Medications  New Prescriptions   No medications on file  Previous Medications   AMLODIPINE  (NORVASC ) 10 MG TABLET    Take 1 tablet (10 mg total) by mouth daily.   ATORVASTATIN  (LIPITOR ) 20 MG TABLET  Take 20 mg by mouth daily.   BUPROPION  (WELLBUTRIN  XL) 300 MG 24 HR TABLET    Take 300 mg by mouth daily.   CHLORTHALIDONE  (HYGROTON ) 25 MG TABLET    Take 12.5 mg by mouth daily as needed (Fluid, or if BP > 150 mm Hg).   LOSARTAN  (COZAAR ) 25 MG TABLET    Take 1 tablet (25 mg total) by mouth daily.   METOPROLOL  SUCCINATE  (TOPROL -XL) 50 MG 24 HR TABLET    Take 1 tablet (50 mg total) by mouth daily. Take with or immediately following a meal.   NITROGLYCERIN  (NITROSTAT ) 0.4 MG SL TABLET    Place 1 tablet (0.4 mg total) under the tongue every 5 (five) minutes as needed.   OXYCODONE  (ROXICODONE ) 15 MG IMMEDIATE RELEASE TABLET    Take 15 mg by mouth 4 (four) times daily as needed for pain.   OZEMPIC, 0.25 OR 0.5 MG/DOSE, 2 MG/3ML SOPN    Inject 0.5 mg into the skin once a week.   PANTOPRAZOLE  (PROTONIX ) 40 MG TABLET    Take 1 tablet (40 mg total) by mouth daily.   PREDNISONE  (DELTASONE ) 5 MG TABLET    Take 5 mg by mouth daily with breakfast.   PREGABALIN (LYRICA) 200 MG CAPSULE    Take 200 mg by mouth daily as needed (For pain).   SERTRALINE  (ZOLOFT ) 100 MG TABLET    Take 100 mg by mouth daily.  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 10/25/2017   Baker cyst    Barrett's esophagus    Colon polyps    Constipation 05/16/2023   Depression 02/05/2018   Gout    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   Gout       10/1 IMO update     H/O colonoscopy 08/2016   Hx of cardiac cath 10/25/2017   NO INTERVENTION   Hypertension    Hyperthyroidism    Migraines    Pain in left foot 05/07/2020   Screening for malignant neoplasm of colon 05/16/2023   Vitamin D deficiency     Past Surgical History Past Surgical History:  Procedure Laterality Date   ABDOMINAL HYSTERECTOMY     APPENDECTOMY     BIOPSY  04/15/2022    Procedure: BIOPSY;  Surgeon: Rollin Dover, Cardenas;  Location: [facility];  Service: Gastroenterology;;   carpal tunnel repair     ENDOVENOUS ABLATION SAPHENOUS VEIN W/ LASER Left 09/27/2017   endovenous laser ablation left greater saphenous vein and stab phlebectomy > 20 incisions left leg by Lynwood Collum Cardenas   ESOPHAGOGASTRODUODENOSCOPY (EGD) WITH PROPOFOL  N/A 04/15/2022   Procedure: ESOPHAGOGASTRODUODENOSCOPY (EGD) WITH PROPOFOL ;  Surgeon: Rollin Dover, Cardenas;  Location: [facility];  Service: Gastroenterology;  Laterality: N/A;   HIATAL HERNIA REPAIR  2000   LEFT HEART CATH AND CORONARY ANGIOGRAPHY N/A 10/25/2017   Procedure: LEFT HEART CATH AND CORONARY ANGIOGRAPHY;  Surgeon: Ladona Heinz, Cardenas;  Location: [facility];  Service: Cardiovascular;  Laterality: N/A;  MEDIASTERNOTOMY N/A 04/11/2018   Procedure: MEDIAN STERNOTOMY;  Surgeon: Kerrin Elspeth BROCKS, Cardenas;  [facility];  Service: Thoracic;  Laterality: N/A;   THYMECTOMY N/A 04/11/2018   Procedure: THYMECTOMY;  Surgeon: Kerrin Elspeth BROCKS, Cardenas;  Location: [facility] OR;  Service: Thoracic;  Laterality: N/A;   TONSILLECTOMY      Family History family history includes Depression in her mother; Diabetes Mellitus II in her mother; Heart disease in her mother; Hyperlipidemia in her mother; Hypertension in her mother; Sleep apnea in her mother; Thyroid  disease in her cousin.  Social History Social History   Socioeconomic History   Marital status: Single    Spouse name: Not on file   Number of children: 4   Years of education: Not on file   Highest education level: Not on file  Occupational History   Occupation: unemployed  Tobacco Use   Smoking status: Former    Current packs/day: 0.00    Average packs/day: 0.3 packs/day for 25.0 years (6.3 ttl pk-yrs)    Types: E-cigarettes, Cigarettes    Start date: 05/1994    Quit date: 05/2019    Years since quitting: 5.3   Smokeless tobacco: Never   Tobacco comments:    trying to quit, 3  days/week patient uses vape about 3 times a day when craving nicotine    Vaping Use   Vaping status: Former   Quit date: 07/02/2022   Substances: Nicotine   Substance and Sexual Activity   Alcohol use: No   Drug use: No   Sexual activity: Not Currently  Other Topics Concern   Not on file  Social History Narrative   Not on file   Social Drivers of Health   Financial Resource Strain: Medium Risk (08/07/2021)   Received from [facility]   Overall Financial Resource Strain (CARDIA)    Difficulty of Paying Living Expenses: Somewhat hard  Food Insecurity: No Food Insecurity (11/17/2021)   Received from [facility]   Hunger Vital Sign    Within the past 12 months, you worried that your food would run out before you got the money to buy more.: Never true    Within the past 12 months, the food you bought just didn't last and you didn't have money to get more.: Never true  Transportation Needs: No Transportation Needs (08/07/2021)   Received from [facility] - Transportation    Lack of Transportation (Medical): No    Lack of Transportation (Non-Medical): No  Physical Activity: Inactive (08/07/2021)   Received from [facility]   Exercise Vital Sign    On average, how many days per week do you engage in moderate to strenuous exercise (like a brisk walk)?: 0 days    On average, how many minutes do you engage in exercise at this level?: 0 min  Stress: Stress Concern Present (08/07/2021)   Received from [facility] - Occupational Stress Questionnaire    Feeling of Stress : Very much  Social Connections: Moderately Isolated (08/07/2021)   Received from [facility]   Social Connection and Isolation Panel    In a typical week, how many times do you talk on the phone with family, friends, or neighbors?: Twice a week    How often do you get together with friends or relatives?: Once a week    How often do you attend church or religious  services?: Never    Do you belong to any clubs or organizations such as  church groups, unions, fraternal or athletic groups, or school groups?: Yes    Attends Banker Meetings: Not on file    Are you married, widowed, divorced, separated, never married, or living with a partner?: Widowed  Intimate Partner Violence: Not At Risk (08/07/2021)   Received from [facility]   Humiliation, Afraid, Rape, and Kick questionnaire    Within the last year, have you been afraid of your partner or ex-partner?: No    Within the last year, have you been humiliated or emotionally abused in other ways by your partner or ex-partner?: No    Within the last year, have you been kicked, hit, slapped, or otherwise physically hurt by your partner or ex-partner?: No    Within the last year, have you been raped or forced to have any kind of sexual activity by your partner or ex-partner?: No    Lab Results  Component Value Date   CHOL 139 01/08/2024   Lab Results  Component Value Date   HDL 41 01/08/2024   Lab Results  Component Value Date   LDLCALC 77 01/08/2024   Lab Results  Component Value Date   TRIG 116 01/08/2024   Lab Results  Component Value Date   CHOLHDL 3.4 01/08/2024   Lab Results  Component Value Date   CREATININE 1.30 (H) 06/30/2024   No results found for: GFR    Component Value Date/Time   NA 139 06/30/2024 2141   NA 146 (H) 04/10/2023 1405   K 3.7 06/30/2024 2141   CL 101 06/30/2024 2141   CO2 25 06/30/2024 2141   GLUCOSE 150 (H) 06/30/2024 2141   BUN 22 (H) 06/30/2024 2141   BUN 33 (H) 04/10/2023 1405   CREATININE 1.30 (H) 06/30/2024 2141   CALCIUM  9.1 06/30/2024 2141   PROT 6.3 (L) 05/16/2024 1656   ALBUMIN 3.7 05/16/2024 1656   AST 18 05/16/2024 1656   ALT 16 05/16/2024 1656   ALKPHOS 63 05/16/2024 1656   BILITOT 0.7 05/16/2024 1656   GFRNONAA 47 (L) 06/30/2024 2141   GFRAA >60 04/13/2018 0249      Latest Ref Rng & Units 06/30/2024    9:41 PM  05/16/2024    4:56 PM 09/11/2023    7:37 AM  BMP  Glucose 70 - 99 mg/dL 849  845  883   BUN 6 - 20 mg/dL 22  21  15    Creatinine 0.44 - 1.00 mg/dL 8.69  8.50  9.16   Sodium 135 - 145 mmol/L 139  138  138   Potassium 3.5 - 5.1 mmol/L 3.7  3.7  3.5   Chloride 98 - 111 mmol/L 101  101  100   CO2 22 - 32 mmol/L 25  25  28    Calcium  8.9 - 10.3 mg/dL 9.1  9.4  9.5        Component Value Date/Time   WBC 9.5 06/30/2024 2141   RBC 4.65 06/30/2024 2141   HGB 14.0 06/30/2024 2141   HCT 43.5 06/30/2024 2141   PLT 284 06/30/2024 2141   MCV 93.5 06/30/2024 2141   MCH 30.1 06/30/2024 2141   MCHC 32.2 06/30/2024 2141   RDW 13.2 06/30/2024 2141   LYMPHSABS 2.2 04/13/2022 2305   MONOABS 1.0 04/13/2022 2305   EOSABS 0.3 04/13/2022 2305   BASOSABS 0.1 04/13/2022 2305   Lab Results  Component Value Date   TSH 0.127 (L) 03/14/2023   TSH 0.802 02/11/2018   TSH 0.031 (L) 10/25/2017   FREET4  1.10 04/10/2023   FREET4 0.73 (L) 02/11/2018         Parts of this note may have been dictated using voice recognition software. There may be variances in spelling and vocabulary which are unintentional. Not all errors are proofread. Please notify the dino if any discrepancies are noted or if the meaning of any statement is not clear.

## 2024-09-03 ENCOUNTER — Ambulatory Visit: Admitting: Cardiology

## 2024-09-22 ENCOUNTER — Other Ambulatory Visit

## 2024-09-23 ENCOUNTER — Other Ambulatory Visit: Payer: Self-pay | Admitting: "Endocrinology

## 2024-09-23 ENCOUNTER — Ambulatory Visit: Payer: Self-pay | Admitting: "Endocrinology

## 2024-09-23 MED ORDER — METHIMAZOLE 5 MG PO TABS: 5.0000 mg | ORAL_TABLET | Freq: Every day | ORAL | 0 refills | Status: AC

## 2024-09-28 ENCOUNTER — Other Ambulatory Visit: Payer: Self-pay | Admitting: Cardiology

## 2024-09-28 DIAGNOSIS — I10 Essential (primary) hypertension: Secondary | ICD-10-CM

## 2024-09-29 ENCOUNTER — Telehealth (INDEPENDENT_AMBULATORY_CARE_PROVIDER_SITE_OTHER): Admitting: "Endocrinology

## 2024-09-29 ENCOUNTER — Encounter: Payer: Self-pay | Admitting: "Endocrinology

## 2024-09-29 VITALS — Ht 62.0 in | Wt 164.0 lb

## 2024-09-29 DIAGNOSIS — E0590 Thyrotoxicosis, unspecified without thyrotoxic crisis or storm: Secondary | ICD-10-CM

## 2024-09-29 DIAGNOSIS — D3502 Benign neoplasm of left adrenal gland: Secondary | ICD-10-CM

## 2024-09-29 DIAGNOSIS — E041 Nontoxic single thyroid nodule: Secondary | ICD-10-CM | POA: Diagnosis not present

## 2024-09-29 MED ORDER — METHIMAZOLE 5 MG PO TABS: 5.0000 mg | ORAL_TABLET | Freq: Every day | ORAL | 0 refills | Status: AC

## 2024-09-29 MED ORDER — DEXAMETHASONE 1 MG PO TABS: 1.0000 mg | ORAL_TABLET | Freq: Once | ORAL | 0 refills | Status: AC

## 2024-09-29 NOTE — Progress Notes (Signed)
 "  The patient reports they are currently: [place]. I spent 12-13 minutes on the video with the patient on the date of service. I spent an additional 4 minutes on pre- and post-visit activities on the date of service.   The patient was physically located in   or a state in which I am permitted to provide care. The patient and/or parent/guardian understood that s/he may incur co-pays and cost sharing, and agreed to the telemedicine visit. The visit was reasonable and appropriate under the circumstances given the patient's presentation at the time.  The patient and/or parent/guardian understands the potential risks and limitations of this mode of treatment (including, but not limited to, the absence of in-person examination) and has agreed to be treated using telemedicine. The patient's/patient's family's questions regarding telemedicine have been answered.   The patient and/or parent/guardian will contact their provider's office for worsening conditions, and seek emergency medical treatment and/or call 911 if the patient deems either necessary.     Outpatient Endocrinology Note Obadiah Birmingham, MD    Amanda Cardenas [birth year] [id number]  Referring Provider: Jerome Heron Ruth, PA-C Primary Care Provider: Jerome Heron Ruth, PA-C Reason for consultation: Subjective   Assessment & Plan  Diagnoses and all orders for this visit:  Adenoma of left adrenal gland -     Cortisol; Future  Subclinical hyperthyroidism -     ACTH ; Future -     US  THYROID ; Future  Thyroid  nodule -     US  THYROID ; Future  Other orders -     dexamethasone  (DECADRON ) 1 MG tablet; Take 1 tablet (1 mg total) by mouth once for 1 dose. Take at 11 pm followed by blood work next morning at 8 am. Timings are specific. -     methimazole  (TAPAZOLE ) 5 MG tablet; Take 1 tablet (5 mg total) by mouth daily.    Patient is referred due to adrenal incidentaloma which apparently has been there since 2010 05/16/24: CT  Abd Pel W contrast:  Adrenals/Urinary Tract: Left adrenal nodule measuring 3.2 x 2.7 cm with Hounsfield 55 previously measured 2.3 cm with characteristics suggestive of adenoma on prior noncontrast CT. 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 06/13/19: CT Abd Pel W contrast Right adrenal gland unremarkable. Nodule of the left adrenal gland demonstrates Hounsfield characteristics on prior noncontrast CT which are compatible with adrenal adenoma. Transverse diameter on the current study 2.3 cm, which is relatively unchanged. There is a  09/01/2009: CT ABDOMEN AND PELVIS WITHOUT CONTRAST: 2.7 cm low attenuation left adrenal adenoma.  Recommend baseline 8 AM adrenal blood work: WNL except aldo/renin pending; will f/u on it Ordered 1 mg dexamethasone  suppression test  Ordered thyroid  U/S as patient reports history of thyroid  nodules Patient will share her last thyroid  US  report, not in epic  Subclinical hyperthyroidism noted on labs Patient reports heat intolerance and fatigue 09/23/24: start methimazole  5 mg every day -complications of untreated hyperthyroidism include atrial fibrillation, heart failure and osteoporosis -discussed about side effects of Methimazole  including but not limited to allergic reaction, rash, bone marrow suppression, liver dysfunction  -compliance and follow up needs    If you notice any symptoms of worsening fatigue, fever with sore throat, loss of appetite, yellowing of eyes, dark urine, joint pains, sores in the mouth, itchy rash, light colored stools or abdominal pain, please stop the medication and call us  immediately as this can be a serious side effect of the medication.  Labs before next visit  Return in about 6 weeks (around 11/10/2024) for visit + labs before next visit, 8 am lab tomorrow .   I have reviewed current medications, nurse's notes, allergies, vital signs, past medical and surgical history, family medical history,  and social history for this encounter. Counseled patient on symptoms, examination findings, lab findings, imaging results, treatment decisions and monitoring and prognosis. The patient understood the recommendations and agrees with the treatment plan. All questions regarding treatment plan were fully answered.  Obadiah Birmingham, MD  09/29/2024  HPI  Amanda Cardenas is a 58 y.o. female  referred by Dr. Jerome for evaluation and management of Left adrenal adenoma present at least since 2010.   C/o slight head ache and hand trembling Patient reports history of thyroid  nodule and mentions feeling submandibular lymph nodes No other complaints reported  Initial complaints: She moon face No fat pads No increased girth Yes plethora No hyperpigmentation No purple striae No proximal muscle weakness No acne No vellus/terminal hirsutism Yes scalp hair loss Yes a history of HTN Yes a history of hypokalemia No paroxysmal episodes of anxiety Yes tremors Yes lightheadedness No headache Yes sweating Yes blurry vision Yes, sometimes   fatigue Yes No dysphagia/dysphonia/dyspnea Palpitations Yes  weight change Yes+-5 lbs up or down Constipation Some hyperdefecation No Heat intolerance Yes  Physical Exam  Ht 5' 2 (1.575 m)   Wt 164 lb (74.4 kg)   LMP 10/18/2011   BMI 30.00 kg/m    Constitutional: well developed, well nourished No fat pads or stretch marks noted Head: normocephalic, atraumatic Eyes: sclera anicteric, no redness Neck: no thyromegaly or thyroid  nodules inspected Lungs: normal respiratory effort Neurology: alert and oriented Skin: dry, no appreciable rashes Musculoskeletal: no appreciable defects Psychiatric: normal mood and affect   Current Medications Patient's Medications  New Prescriptions   DEXAMETHASONE  (DECADRON ) 1 MG TABLET    Take 1 tablet (1 mg total) by mouth once for 1 dose. Take at 11 pm followed by blood work next morning at 8 am. Timings are specific.    METHIMAZOLE  (TAPAZOLE ) 5 MG TABLET    Take 1 tablet (5 mg total) by mouth daily.  Previous Medications   AMLODIPINE  (NORVASC ) 10 MG TABLET    Take 1 tablet (10 mg total) by mouth daily.   ATORVASTATIN  (LIPITOR ) 20 MG TABLET    Take 20 mg by mouth daily.   BUPROPION  (WELLBUTRIN  XL) 300 MG 24 HR TABLET    Take 300 mg by mouth daily.   CHLORTHALIDONE  (HYGROTON ) 25 MG TABLET    Take 12.5 mg by mouth daily as needed (Fluid, or if BP > 150 mm Hg).   LOSARTAN  (COZAAR ) 25 MG TABLET    Take 1 tablet (25 mg total) by mouth daily.   METHIMAZOLE  (TAPAZOLE ) 5 MG TABLET    Take 1 tablet (5 mg total) by mouth daily.   METOPROLOL  SUCCINATE (TOPROL -XL) 50 MG 24 HR TABLET    Take 1 tablet (50 mg total) by mouth daily. Take with or immediately following a meal.   NITROGLYCERIN  (NITROSTAT ) 0.4 MG SL TABLET    Place 1 tablet (0.4 mg total) under the tongue every 5 (five) minutes as needed.   OXYCODONE  (ROXICODONE ) 15 MG IMMEDIATE RELEASE TABLET    Take 15 mg by mouth 4 (four) times daily as needed for pain.   OZEMPIC, 0.25 OR 0.5 MG/DOSE, 2 MG/3ML SOPN    Inject 0.5 mg into the skin once a week.   PANTOPRAZOLE  (PROTONIX ) 40 MG TABLET  Take 1 tablet (40 mg total) by mouth daily.   PREDNISONE  (DELTASONE ) 5 MG TABLET    Take 5 mg by mouth daily with breakfast.   PREGABALIN (LYRICA) 200 MG CAPSULE    Take 200 mg by mouth daily as needed (For pain).   SERTRALINE  (ZOLOFT ) 100 MG TABLET    Take 100 mg by mouth daily.  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 10/25/2017   Baker cyst    Barrett's esophagus    Colon polyps    Constipation 05/16/2023   Depression 02/05/2018   Gout    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   Gout       10/1 IMO update     H/O colonoscopy 08/2016   Hx of cardiac cath 10/25/2017   NO INTERVENTION   Hypertension    Hyperthyroidism    Migraines    Pain in left foot 05/07/2020   Screening for malignant neoplasm of colon 05/16/2023   Vitamin D deficiency     Past Surgical History Past Surgical History:  Procedure Laterality Date   ABDOMINAL HYSTERECTOMY     APPENDECTOMY     BIOPSY  04/15/2022   Procedure: BIOPSY;  Surgeon: Rollin Dover, MD;  Location: [facility];  Service: Gastroenterology;;   carpal tunnel repair     ENDOVENOUS ABLATION SAPHENOUS VEIN W/ LASER Left 09/27/2017   endovenous laser ablation left greater saphenous vein and stab phlebectomy > 20 incisions left leg by Lynwood Collum MD   ESOPHAGOGASTRODUODENOSCOPY (EGD) WITH PROPOFOL  N/A 04/15/2022   Procedure: ESOPHAGOGASTRODUODENOSCOPY (EGD) WITH PROPOFOL ;  Surgeon: Rollin Dover, MD;  Location: [facility];  Service: Gastroenterology;  Laterality: N/A;   HIATAL HERNIA REPAIR  2000   LEFT HEART CATH AND CORONARY ANGIOGRAPHY N/A 10/25/2017   Procedure: LEFT HEART CATH AND CORONARY ANGIOGRAPHY;  Surgeon: Ladona Heinz, MD;  Location: [facility];  Service: Cardiovascular;  Laterality: N/A;   MEDIASTERNOTOMY N/A 04/11/2018   Procedure: MEDIAN STERNOTOMY;  Surgeon: Kerrin Elspeth BROCKS, MD;  Location: [facility];  Service: Thoracic;  Laterality: N/A;   THYMECTOMY N/A 04/11/2018   Procedure: THYMECTOMY;  Surgeon: Kerrin Elspeth BROCKS, MD;  Location: [facility] OR;  Service: Thoracic;  Laterality: N/A;   TONSILLECTOMY      Family History family history includes Depression in her mother; Diabetes Mellitus II in her mother; Heart disease in her mother; Hyperlipidemia in her mother; Hypertension in her mother; Sleep  apnea in her mother; Thyroid  disease in her cousin.  Social History Social History   Socioeconomic History   Marital status: Single    Spouse name: Not on file   Number of children: 4   Years of education: Not on file   Highest education level: Not on file  Occupational History   Occupation: unemployed  Tobacco Use   Smoking status: Former    Current packs/day: 0.00    Average packs/day: 0.3 packs/day for 25.0 years (6.3 ttl pk-yrs)    Types: E-cigarettes, Cigarettes  Start date: 05/1994    Quit date: 05/2019    Years since quitting: 5.4   Smokeless tobacco: Never   Tobacco comments:    trying to quit, 3 days/week patient uses vape about 3 times a day when craving nicotine    Vaping Use   Vaping status: Former   Quit date: 07/02/2022   Substances: Nicotine   Substance and Sexual Activity   Alcohol use: No   Drug use: No   Sexual activity: Not Currently  Other Topics Concern   Not on file  Social History Narrative   Not on file   Social Drivers of Health   Tobacco Use: Medium Risk (09/29/2024)   Patient History    Smoking Tobacco Use: Former    Smokeless Tobacco Use: Never    Passive Exposure: Not on file  Financial Resource Strain: Not on file  Food Insecurity: No Food Insecurity (11/17/2021)   Received from [facility]   Epic    Within the past 12 months, you worried that your food would run out before you got the money to buy more.: Never true    Within the past 12 months, the food you bought just didn't last and you didn't have money to get more.: Never true  Transportation Needs: Not on file  Physical Activity: Not on file  Stress: Not on file  Social Connections: Not on file  Intimate Partner Violence: Not on file  Depression (PHQ2-9): Low Risk (05/16/2023)   Depression (PHQ2-9)    PHQ-2 Score: 3  Alcohol Screen: Not on file  Housing: Not on file  Utilities: Not on file  Health Literacy: Not on file    Lab Results  Component Value Date   CHOL 139  01/08/2024   Lab Results  Component Value Date   HDL 41 01/08/2024   Lab Results  Component Value Date   LDLCALC 77 01/08/2024   Lab Results  Component Value Date   TRIG 116 01/08/2024   Lab Results  Component Value Date   CHOLHDL 3.4 01/08/2024   Lab Results  Component Value Date   CREATININE 1.30 (H) 06/30/2024   No results found for: GFR    Component Value Date/Time   NA 139 06/30/2024 2141   NA 146 (H) 04/10/2023 1405   K 3.7 06/30/2024 2141   CL 101 06/30/2024 2141   CO2 25 06/30/2024 2141   GLUCOSE 150 (H) 06/30/2024 2141   BUN 22 (H) 06/30/2024 2141   BUN 33 (H) 04/10/2023 1405   CREATININE 1.30 (H) 06/30/2024 2141   CALCIUM  9.1 06/30/2024 2141   PROT 6.3 (L) 05/16/2024 1656   ALBUMIN 3.7 05/16/2024 1656   AST 18 05/16/2024 1656   ALT 16 05/16/2024 1656   ALKPHOS 63 05/16/2024 1656   BILITOT 0.7 05/16/2024 1656   GFRNONAA 47 (L) 06/30/2024 2141   GFRAA >60 04/13/2018 0249      Latest Ref Rng & Units 06/30/2024    9:41 PM 05/16/2024    4:56 PM 09/11/2023    7:37 AM  BMP  Glucose 70 - 99 mg/dL 849  845  883   BUN 6 - 20 mg/dL 22  21  15    Creatinine 0.44 - 1.00 mg/dL 8.69  8.50  9.16   Sodium 135 - 145 mmol/L 139  138  138   Potassium 3.5 - 5.1 mmol/L 3.7  3.7  3.5   Chloride 98 - 111 mmol/L 101  101  100   CO2 22 - 32 mmol/L 25  25  28   Calcium  8.9 - 10.3 mg/dL 9.1  9.4  9.5        Component Value Date/Time   WBC 9.5 06/30/2024 2141   RBC 4.65 06/30/2024 2141   HGB 14.0 06/30/2024 2141   HCT 43.5 06/30/2024 2141   PLT 284 06/30/2024 2141   MCV 93.5 06/30/2024 2141   MCH 30.1 06/30/2024 2141   MCHC 32.2 06/30/2024 2141   RDW 13.2 06/30/2024 2141   LYMPHSABS 2.2 04/13/2022 2305   MONOABS 1.0 04/13/2022 2305   EOSABS 0.3 04/13/2022 2305   BASOSABS 0.1 04/13/2022 2305   Lab Results  Component Value Date   TSH 0.01 (L) 09/22/2024   TSH 0.127 (L) 03/14/2023   TSH 0.802 02/11/2018   FREET4 1.3 09/22/2024   FREET4 1.10 04/10/2023    FREET4 0.73 (L) 02/11/2018         Parts of this note may have been dictated using voice recognition software. There may be variances in spelling and vocabulary which are unintentional. Not all errors are proofread. Please notify the dino if any discrepancies are noted or if the meaning of any statement is not clear.   "

## 2024-09-30 ENCOUNTER — Other Ambulatory Visit

## 2024-09-30 LAB — METANEPHRINES, PLASMA
Metanephrine, Free: <25 pg/mL
Normetanephrine, Free: 95 pg/mL
Total Metanephrines-Plasma: 95 pg/mL

## 2024-09-30 LAB — ACTH: C206 ACTH: 7 pg/mL (ref 6–50)

## 2024-09-30 LAB — ALDOSTERONE + RENIN ACTIVITY W/ RATIO
ALDO / PRA Ratio: 16.7 ratio (ref 0.9–28.9)
Aldosterone: 5 ng/dL
Renin Activity: 0.3 ng/mL/h (ref 0.25–5.82)

## 2024-09-30 LAB — T4, FREE: Free T4: 1.3 ng/dL (ref 0.8–1.8)

## 2024-09-30 LAB — THYROID STIMULATING IMMUNOGLOBULIN: TSI: <89 %{baseline}

## 2024-09-30 LAB — DHEA-SULFATE: DHEA-SO4: 7 ug/dL (ref 5–167)

## 2024-09-30 LAB — TRAB (TSH RECEPTOR BINDING ANTIBODY): TRAB: <1 IU/L

## 2024-09-30 LAB — T3, FREE: T3, Free: 4 pg/mL (ref 2.3–4.2)

## 2024-09-30 LAB — CORTISOL: Cortisol, Plasma: 8.7 ug/dL

## 2024-09-30 LAB — TSH: TSH: 0.01 m[IU]/L — ABNORMAL LOW (ref 0.40–4.50)

## 2024-09-30 MED ORDER — LOSARTAN POTASSIUM 25 MG PO TABS: 25.0000 mg | ORAL_TABLET | Freq: Every day | ORAL | 3 refills | Status: AC

## 2024-09-30 NOTE — Addendum Note (Signed)
 Addended by: Dareion Kneece on: 09/30/2024 08:38 AM   Modules accepted: Orders

## 2024-10-03 ENCOUNTER — Ambulatory Visit: Payer: Self-pay | Admitting: "Endocrinology

## 2024-10-04 LAB — ACTH: C206 ACTH: <5 pg/mL — ABNORMAL LOW (ref 6–50)

## 2024-10-04 LAB — CORTISOL: Cortisol, Plasma: 4.2 ug/dL

## 2024-10-06 ENCOUNTER — Other Ambulatory Visit: Payer: Self-pay | Admitting: "Endocrinology

## 2024-10-06 DIAGNOSIS — D3502 Benign neoplasm of left adrenal gland: Secondary | ICD-10-CM

## 2024-10-06 MED ORDER — DEXAMETHASONE 4 MG PO TABS: 8.0000 mg | ORAL_TABLET | Freq: Once | ORAL | 0 refills | Status: AC

## 2024-10-07 ENCOUNTER — Ambulatory Visit (HOSPITAL_COMMUNITY)
Admission: RE | Admit: 2024-10-07 | Discharge: 2024-10-07 | Disposition: A | Discharge status: Home / Self Care | Source: Ambulatory Visit | Attending: "Endocrinology | Admitting: "Endocrinology

## 2024-10-07 DIAGNOSIS — E0590 Thyrotoxicosis, unspecified without thyrotoxic crisis or storm: Secondary | ICD-10-CM | POA: Insufficient documentation

## 2024-10-07 DIAGNOSIS — E041 Nontoxic single thyroid nodule: Secondary | ICD-10-CM | POA: Diagnosis present

## 2024-10-08 ENCOUNTER — Other Ambulatory Visit: Payer: Self-pay | Admitting: Physician Assistant

## 2024-10-08 ENCOUNTER — Other Ambulatory Visit

## 2024-10-08 DIAGNOSIS — Z1231 Encounter for screening mammogram for malignant neoplasm of breast: Secondary | ICD-10-CM

## 2024-10-09 ENCOUNTER — Telehealth (INDEPENDENT_AMBULATORY_CARE_PROVIDER_SITE_OTHER): Admitting: "Endocrinology

## 2024-10-09 ENCOUNTER — Encounter: Payer: Self-pay | Admitting: "Endocrinology

## 2024-10-09 VITALS — Ht 62.0 in | Wt 162.0 lb

## 2024-10-09 DIAGNOSIS — D3502 Benign neoplasm of left adrenal gland: Secondary | ICD-10-CM | POA: Diagnosis not present

## 2024-10-09 DIAGNOSIS — E0590 Thyrotoxicosis, unspecified without thyrotoxic crisis or storm: Secondary | ICD-10-CM | POA: Diagnosis not present

## 2024-10-09 DIAGNOSIS — E042 Nontoxic multinodular goiter: Secondary | ICD-10-CM

## 2024-10-09 NOTE — Patient Instructions (Signed)
  If you notice any symptoms of worsening fatigue, fever with sore throat, loss of appetite, yellowing of eyes, dark urine, joint pains, sores in the mouth, itchy rash, light colored stools or abdominal pain, please stop the medication and call us immediately as this can be a serious side effect of the medication.

## 2024-10-09 NOTE — Progress Notes (Signed)
 "  The patient reports they are currently: Amanda Cardenas. I spent 11 minutes on the video with the patient on the date of service. I spent an additional 10 minutes on pre- and post-visit activities on the date of service.   The patient was physically located in [place]  or a state in which I am permitted to provide care. The patient and/or parent/guardian understood that s/he may incur co-pays and cost sharing, and agreed to the telemedicine visit. The visit was reasonable and appropriate under the circumstances given the patient's presentation at the time.  The patient and/or parent/guardian understands the potential risks and limitations of this mode of treatment (including, but not limited to, the absence of in-person examination) and has agreed to be treated using telemedicine. The patient's/patient's family's questions regarding telemedicine have been answered.   The patient and/or parent/guardian will contact their provider's office for worsening conditions, and seek emergency medical treatment and/or call 911 if the patient deems either necessary.     Outpatient Endocrinology Note Obadiah Birmingham, MD    Johnnie Moten Jun 12, 1966 [id number]  Referring Provider: Jerome Heron Ruth, PA-C Primary Care Provider: Jerome Heron Ruth, PA-C Reason for consultation: Subjective   Assessment & Plan  Diagnoses and all orders for this visit:  Adenoma of left adrenal gland  Subclinical hyperthyroidism -     TSH -     T3, free -     T4, free  Multinodular goiter -     TSH -     T3, free -     T4, free -     US  FNA BX THYROID  1ST LESION AFIRMA; Future -     US  FNA BIOPSY THYROID  EA ADD LESION AFIRMA; Future   Patient is referred due to adrenal incidentaloma which apparently has been there since 2010 05/16/24: CT Abd Pel W contrast:  Adrenals/Urinary Tract: Left adrenal nodule measuring 3.2 x 2.7 cm with Hounsfield 55 previously measured 2.3 cm with characteristics suggestive of adenoma on  prior noncontrast CT. 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 06/13/19: CT Abd Pel W contrast Right adrenal gland unremarkable. Nodule of the left adrenal gland demonstrates Hounsfield characteristics on prior noncontrast CT which are compatible with adrenal adenoma. Transverse diameter on the current study 2.3 cm, which is relatively unchanged. There is a  09/01/2009: CT ABDOMEN AND PELVIS WITHOUT CONTRAST: 2.7 cm low attenuation left adrenal adenoma.  Recommend baseline 8 AM adrenal blood work: WNL except aldo/renin pending; will f/u on it Ordered 1 mg dexamethasone  suppression test: cortisol 4.2 Ordered 8 mg dexamethasone  suppression test: pending   History of thyroid  nodules with FNA Reviewed report and images of thyroid  U/S 10/07/2024: reported multinodular goiter with biopsy recommendation for 5 different nodules. Given patient's last FNAs were in 2014, it is not possible to reliably confirm that any of these nodules are similar hence will follow the current recommendations. Ordered FNA for Nodule # 1 right TR3 2.7 cm x 2.2 cm x 2.0 cm, Nodule # 3 right TR4 1.9 cm x 1.7 cm x 1.2 cm and Nodule # 4 left TR4 2.1 cm x 1.7 cm x 1.2 cm  Next time will order FNA for Nodule 5 left TR4 1.7 cm x 1.7 cm x 1.5 cm and nodule 6 left TR4 1.6 cm x 1.2 cm x 1.4 cm  Subclinical hyperthyroidism noted on labs Patient reports heat intolerance and fatigue 09/23/24: start methimazole  5 mg every day -complications of untreated hyperthyroidism include atrial fibrillation,  heart failure and osteoporosis -discussed about side effects of Methimazole  including but not limited to allergic reaction, rash, bone marrow suppression, liver dysfunction  -compliance and follow up needs    If you notice any symptoms of worsening fatigue, fever with sore throat, loss of appetite, yellowing of eyes, dark urine, joint pains, sores in the mouth, itchy rash, light colored stools or abdominal  pain, please stop the medication and call us  immediately as this can be a serious side effect of the medication.  Labs before next visit   Return in about 6 weeks (around 11/21/2024) for tele-visit, labs today.   I have reviewed current medications, nurse's notes, allergies, vital signs, past medical and surgical history, family medical history, and social history for this encounter. Counseled patient on symptoms, examination findings, lab findings, imaging results, treatment decisions and monitoring and prognosis. The patient understood the recommendations and agrees with the treatment plan. All questions regarding treatment plan were fully answered.  Obadiah Birmingham, MD  10/09/2024  HPI  Amanda Cardenas is a 59 y.o. female  referred by Dr. Jerome for evaluation and management of Left adrenal adenoma present at least since 2010.   C/o slight head ache and hand trembling Patient reports history of thyroid  nodule and mentions feeling submandibular lymph nodes No other complaints reported Feels no changes with methimazole  5 mg every day  Adverse Drug Effects from Methimazole  (MMI): rash No fever No throat pain Yes, started after MMI arthritis No mouth ulcers Yes, started after MMI jaundice No loss of appetite No lymphadenopathy No  Every now and then can get choked and had problems swallowing, but no change in voice/no issues breathing   Initial complaints: She moon face No fat pads No increased girth Yes plethora No hyperpigmentation No purple striae No proximal muscle weakness No acne No vellus/terminal hirsutism Yes scalp hair loss Yes a history of HTN Yes a history of hypokalemia No paroxysmal episodes of anxiety Yes tremors Yes lightheadedness No headache Yes sweating Yes blurry vision Yes, sometimes   fatigue Yes No dysphagia/dysphonia/dyspnea Palpitations Yes  weight change Yes+-5 lbs up or down Constipation Some hyperdefecation No Heat intolerance  Yes  Physical Exam  Ht 5' 2 (1.575 m)   Wt 162 lb (73.5 kg)   LMP 10/18/2011   BMI 29.63 kg/m    Constitutional: well developed, well nourished No fat pads or stretch marks noted Head: normocephalic, atraumatic Eyes: sclera anicteric, no redness Neck: no thyromegaly or thyroid  nodules inspected Lungs: normal respiratory effort Neurology: alert and oriented Skin: dry, no appreciable rashes Musculoskeletal: no appreciable defects Psychiatric: normal mood and affect   Current Medications Patient's Medications  New Prescriptions   No medications on file  Previous Medications   AMLODIPINE  (NORVASC ) 10 MG TABLET    Take 1 tablet (10 mg total) by mouth daily.   ATORVASTATIN  (LIPITOR ) 20 MG TABLET    Take 20 mg by mouth daily.   BUPROPION  (WELLBUTRIN  XL) 300 MG 24 HR TABLET    Take 300 mg by mouth daily.   CHLORTHALIDONE  (HYGROTON ) 25 MG TABLET    TAKE 1/2 TABLET BY MOUTH EVERY DAY   LOSARTAN  (COZAAR ) 25 MG TABLET    Take 1 tablet (25 mg total) by mouth daily.   METHIMAZOLE  (TAPAZOLE ) 5 MG TABLET    Take 1 tablet (5 mg total) by mouth daily.   METHIMAZOLE  (TAPAZOLE ) 5 MG TABLET    Take 1 tablet (5 mg total) by mouth daily.   METOPROLOL  SUCCINATE (TOPROL -XL) 50 MG  24 HR TABLET    Take 1 tablet (50 mg total) by mouth daily. Take with or immediately following a meal.   NITROGLYCERIN  (NITROSTAT ) 0.4 MG SL TABLET    Place 1 tablet (0.4 mg total) under the tongue every 5 (five) minutes as needed.   OXYCODONE  (ROXICODONE ) 15 MG IMMEDIATE RELEASE TABLET    Take 15 mg by mouth 4 (four) times daily as needed for pain.   OZEMPIC, 0.25 OR 0.5 MG/DOSE, 2 MG/3ML SOPN    Inject 0.5 mg into the skin once a week.   PANTOPRAZOLE  (PROTONIX ) 40 MG TABLET    Take 1 tablet (40 mg total) by mouth daily.   PREDNISONE  (DELTASONE ) 5 MG TABLET    Take 5 mg by mouth daily with breakfast.   PREGABALIN (LYRICA) 200 MG CAPSULE    Take 200 mg by mouth daily as needed (For pain).   SERTRALINE  (ZOLOFT ) 100 MG TABLET     Take 100 mg by mouth daily.  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 10/25/2017   Baker cyst    Barrett's esophagus    Colon polyps    Constipation 05/16/2023   Depression 02/05/2018   Gout    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   Gout       10/1 IMO update     H/O colonoscopy 08/2016   Hx of cardiac cath 10/25/2017   NO INTERVENTION   Hypertension    Hyperthyroidism    Migraines    Pain in left foot 05/07/2020   Screening for malignant neoplasm of colon 05/16/2023   Vitamin D deficiency     Past Surgical History Past Surgical History:  Procedure Laterality Date   ABDOMINAL HYSTERECTOMY     APPENDECTOMY     BIOPSY  04/15/2022   Procedure: BIOPSY;  Surgeon: Rollin Dover, MD;  Location: [facility];  Service: Gastroenterology;;   carpal tunnel repair     ENDOVENOUS ABLATION SAPHENOUS VEIN W/ LASER Left 09/27/2017   endovenous laser ablation left greater saphenous vein and stab phlebectomy > 20 incisions left leg by Lynwood Collum MD   ESOPHAGOGASTRODUODENOSCOPY (EGD) WITH PROPOFOL  N/A 04/15/2022   Procedure: ESOPHAGOGASTRODUODENOSCOPY (EGD) WITH PROPOFOL ;  Surgeon: Rollin Dover, MD;  Location: [facility];  Service: Gastroenterology;  Laterality: N/A;   HIATAL HERNIA REPAIR  2000   LEFT HEART CATH AND CORONARY ANGIOGRAPHY N/A 10/25/2017   Procedure: LEFT HEART CATH AND CORONARY ANGIOGRAPHY;  Surgeon: Ladona Heinz, MD;  Location: [facility];  Service: Cardiovascular;   Laterality: N/A;   MEDIASTERNOTOMY N/A 04/11/2018   Procedure: MEDIAN STERNOTOMY;  Surgeon: Kerrin Elspeth BROCKS, MD;  Location: [facility] OR;  Service: Thoracic;  Laterality: N/A;   THYMECTOMY N/A 04/11/2018   Procedure: THYMECTOMY;  Surgeon: Kerrin Elspeth BROCKS, MD;  Location: [facility] OR;  Service: Thoracic;  Laterality: N/A;   TONSILLECTOMY      Family History family history includes Depression in her mother; Diabetes Mellitus II in her mother; Heart disease in  her mother; Hyperlipidemia in her mother; Hypertension in her mother; Sleep apnea in her mother; Thyroid  disease in her cousin.  Social History Social History   Socioeconomic History   Marital status: Single    Spouse name: Not on file   Number of children: 4   Years of education: Not on file   Highest education level: Not on file  Occupational History   Occupation: unemployed  Tobacco Use   Smoking status: Former    Current packs/day: 0.00    Average packs/day: 0.3 packs/day for 25.0 years (6.3 ttl pk-yrs)    Types: E-cigarettes, Cigarettes    Start date: 05/1994    Quit date: 05/2019    Years since quitting: 5.4   Smokeless tobacco: Never   Tobacco comments:    trying to quit, 3 days/week patient uses vape about 3 times a day when craving nicotine    Vaping Use   Vaping status: Former   Quit date: 07/02/2022   Substances: Nicotine   Substance and Sexual Activity   Alcohol use: No   Drug use: No   Sexual activity: Not Currently  Other Topics Concern   Not on file  Social History Narrative   Not on file   Social Drivers of Health   Tobacco Use: Medium Risk (10/09/2024)   Patient History    Smoking Tobacco Use: Former    Smokeless Tobacco Use: Never    Passive Exposure: Not on file  Financial Resource Strain: Not on file  Food Insecurity: No Food Insecurity (11/17/2021)   Received from [facility]   Epic    Within the past 12 months, you worried that your food would run out before you got the money to buy more.:  Never true    Within the past 12 months, the food you bought just didn't last and you didn't have money to get more.: Never true  Transportation Needs: Not on file  Physical Activity: Not on file  Stress: Not on file  Social Connections: Not on file  Intimate Partner Violence: Not on file  Depression (PHQ2-9): Low Risk (05/16/2023)   Depression (PHQ2-9)    PHQ-2 Score: 3  Alcohol Screen: Not on file  Housing: Not on file  Utilities: Not on file  Health Literacy: Not on file    Lab Results  Component Value Date   CHOL 139 01/08/2024   Lab Results  Component Value Date   HDL 41 01/08/2024   Lab Results  Component Value Date   LDLCALC 77 01/08/2024   Lab Results  Component Value Date   TRIG 116 01/08/2024   Lab Results  Component Value Date   CHOLHDL 3.4 01/08/2024   Lab Results  Component Value Date   CREATININE 1.30 (H) 06/30/2024   No results found for: GFR    Component Value Date/Time   NA 139 06/30/2024 2141   NA 146 (H) 04/10/2023 1405   K 3.7 06/30/2024 2141   CL 101 06/30/2024 2141   CO2 25 06/30/2024 2141   GLUCOSE 150 (H) 06/30/2024 2141   BUN 22 (H) 06/30/2024 2141   BUN 33 (H) 04/10/2023 1405   CREATININE 1.30 (H) 06/30/2024 2141   CALCIUM  9.1 06/30/2024 2141   PROT 6.3 (L) 05/16/2024 1656   ALBUMIN 3.7 05/16/2024 1656   AST 18 05/16/2024 1656   ALT 16 05/16/2024 1656   ALKPHOS 63 05/16/2024 1656   BILITOT 0.7 05/16/2024 1656   GFRNONAA 47 (L) 06/30/2024 2141   GFRAA >60 04/13/2018 0249  Latest Ref Rng & Units 06/30/2024    9:41 PM 05/16/2024    4:56 PM 09/11/2023    7:37 AM  BMP  Glucose 70 - 99 mg/dL 849  845  883   BUN 6 - 20 mg/dL 22  21  15    Creatinine 0.44 - 1.00 mg/dL 8.69  8.50  9.16   Sodium 135 - 145 mmol/L 139  138  138   Potassium 3.5 - 5.1 mmol/L 3.7  3.7  3.5   Chloride 98 - 111 mmol/L 101  101  100   CO2 22 - 32 mmol/L 25  25  28    Calcium  8.9 - 10.3 mg/dL 9.1  9.4  9.5        Component Value Date/Time   WBC  9.5 06/30/2024 2141   RBC 4.65 06/30/2024 2141   HGB 14.0 06/30/2024 2141   HCT 43.5 06/30/2024 2141   PLT 284 06/30/2024 2141   MCV 93.5 06/30/2024 2141   MCH 30.1 06/30/2024 2141   MCHC 32.2 06/30/2024 2141   RDW 13.2 06/30/2024 2141   LYMPHSABS 2.2 04/13/2022 2305   MONOABS 1.0 04/13/2022 2305   EOSABS 0.3 04/13/2022 2305   BASOSABS 0.1 04/13/2022 2305   Lab Results  Component Value Date   TSH 0.01 (L) 09/22/2024   TSH 0.127 (L) 03/14/2023   TSH 0.802 02/11/2018   FREET4 1.3 09/22/2024   FREET4 1.10 04/10/2023   FREET4 0.73 (L) 02/11/2018         Parts of this note may have been dictated using voice recognition software. There may be variances in spelling and vocabulary which are unintentional. Not all errors are proofread. Please notify the dino if any discrepancies are noted or if the meaning of any statement is not clear.   "

## 2024-10-10 ENCOUNTER — Telehealth: Admitting: "Endocrinology

## 2024-10-14 ENCOUNTER — Ambulatory Visit: Admission: RE | Admit: 2024-10-14 | Discharge: 2024-10-14 | Disposition: A | Source: Ambulatory Visit

## 2024-10-14 DIAGNOSIS — Z1231 Encounter for screening mammogram for malignant neoplasm of breast: Secondary | ICD-10-CM

## 2024-10-16 LAB — DEXAMETHASONE, BLOOD: Dexamethasone, Serum: >1000 ng/dL

## 2024-10-16 LAB — CORTISOL: Cortisol, Plasma: 4.2 ug/dL

## 2024-10-16 LAB — ACTH: C206 ACTH: <5 pg/mL — ABNORMAL LOW (ref 6–50)

## 2024-10-17 ENCOUNTER — Other Ambulatory Visit (HOSPITAL_COMMUNITY)
Admission: RE | Admit: 2024-10-17 | Discharge: 2024-10-17 | Disposition: A | Source: Ambulatory Visit | Attending: "Endocrinology | Admitting: "Endocrinology

## 2024-10-17 ENCOUNTER — Ambulatory Visit
Admission: RE | Admit: 2024-10-17 | Discharge: 2024-10-17 | Disposition: A | Discharge status: Home / Self Care | Source: Ambulatory Visit | Attending: "Endocrinology | Admitting: "Endocrinology

## 2024-10-17 DIAGNOSIS — E041 Nontoxic single thyroid nodule: Secondary | ICD-10-CM

## 2024-10-17 DIAGNOSIS — E042 Nontoxic multinodular goiter: Secondary | ICD-10-CM

## 2024-10-21 LAB — CYTOLOGY - NON PAP

## 2024-10-22 ENCOUNTER — Ambulatory Visit: Payer: Self-pay | Admitting: "Endocrinology

## 2024-11-06 ENCOUNTER — Other Ambulatory Visit: Payer: Self-pay

## 2024-11-10 ENCOUNTER — Ambulatory Visit: Admitting: "Endocrinology

## 2024-11-20 ENCOUNTER — Other Ambulatory Visit

## 2024-11-27 ENCOUNTER — Ambulatory Visit: Admitting: "Endocrinology
